# Patient Record
Sex: Male | Born: 1980 | Hispanic: Yes | Marital: Single | Smoking: Never smoker
Health system: Southern US, Community
[De-identification: ages and names within clinical notes are randomized; demographics above are authoritative.]

## PROBLEM LIST (undated history)

## (undated) DIAGNOSIS — R0600 Dyspnea, unspecified: Secondary | ICD-10-CM

## (undated) DIAGNOSIS — K219 Gastro-esophageal reflux disease without esophagitis: Secondary | ICD-10-CM

## (undated) DIAGNOSIS — F419 Anxiety disorder, unspecified: Secondary | ICD-10-CM

## (undated) DIAGNOSIS — F32A Depression, unspecified: Secondary | ICD-10-CM

## (undated) DIAGNOSIS — E079 Disorder of thyroid, unspecified: Secondary | ICD-10-CM

## (undated) HISTORY — DX: Disorder of thyroid, unspecified: E07.9

---

## 2018-03-06 ENCOUNTER — Encounter (HOSPITAL_COMMUNITY): Payer: Self-pay | Admitting: Emergency Medicine

## 2018-03-06 ENCOUNTER — Emergency Department (HOSPITAL_COMMUNITY): Payer: Self-pay

## 2018-03-06 ENCOUNTER — Emergency Department (HOSPITAL_COMMUNITY)
Admission: EM | Admit: 2018-03-06 | Discharge: 2018-03-07 | Disposition: A | Discharge status: Home / Self Care | Payer: Self-pay | Attending: Emergency Medicine | Admitting: Emergency Medicine

## 2018-03-06 DIAGNOSIS — R109 Unspecified abdominal pain: Secondary | ICD-10-CM

## 2018-03-06 DIAGNOSIS — G8929 Other chronic pain: Secondary | ICD-10-CM | POA: Insufficient documentation

## 2018-03-06 DIAGNOSIS — R1032 Left lower quadrant pain: Secondary | ICD-10-CM | POA: Insufficient documentation

## 2018-03-06 LAB — URINALYSIS, ROUTINE W REFLEX MICROSCOPIC
Bilirubin Urine: NEGATIVE
GLUCOSE, UA: NEGATIVE mg/dL
Hgb urine dipstick: NEGATIVE
Ketones, ur: NEGATIVE mg/dL
Leukocytes, UA: NEGATIVE
Nitrite: NEGATIVE
Protein, ur: NEGATIVE mg/dL
Specific Gravity, Urine: 1.023 (ref 1.005–1.030)
pH: 7 (ref 5.0–8.0)

## 2018-03-06 LAB — CBC WITH DIFFERENTIAL/PLATELET
Abs Immature Granulocytes: 0.03 10*3/uL (ref 0.00–0.07)
Basophils Absolute: 0 10*3/uL (ref 0.0–0.1)
Basophils Relative: 0 %
Eosinophils Absolute: 0.2 10*3/uL (ref 0.0–0.5)
Eosinophils Relative: 2 %
HCT: 42.4 % (ref 39.0–52.0)
Hemoglobin: 14 g/dL (ref 13.0–17.0)
Immature Granulocytes: 0 %
LYMPHS ABS: 2.9 10*3/uL (ref 0.7–4.0)
Lymphocytes Relative: 30 %
MCH: 28.9 pg (ref 26.0–34.0)
MCHC: 33 g/dL (ref 30.0–36.0)
MCV: 87.6 fL (ref 80.0–100.0)
Monocytes Absolute: 0.8 10*3/uL (ref 0.1–1.0)
Monocytes Relative: 8 %
Neutro Abs: 5.8 10*3/uL (ref 1.7–7.7)
Neutrophils Relative %: 60 %
Platelets: 174 10*3/uL (ref 150–400)
RBC: 4.84 MIL/uL (ref 4.22–5.81)
RDW: 12 % (ref 11.5–15.5)
WBC: 9.7 10*3/uL (ref 4.0–10.5)
nRBC: 0 % (ref 0.0–0.2)

## 2018-03-06 LAB — COMPREHENSIVE METABOLIC PANEL
ALT: 35 U/L (ref 0–44)
AST: 28 U/L (ref 15–41)
Albumin: 4 g/dL (ref 3.5–5.0)
Alkaline Phosphatase: 62 U/L (ref 38–126)
Anion gap: 9 (ref 5–15)
BUN: 14 mg/dL (ref 6–20)
CO2: 25 mmol/L (ref 22–32)
CREATININE: 1.16 mg/dL (ref 0.61–1.24)
Calcium: 9 mg/dL (ref 8.9–10.3)
Chloride: 105 mmol/L (ref 98–111)
GFR calc Af Amer: >60 mL/min (ref >60)
Glucose, Bld: 108 mg/dL — ABNORMAL HIGH (ref 70–99)
Potassium: 3.7 mmol/L (ref 3.5–5.1)
Sodium: 139 mmol/L (ref 135–145)
Total Bilirubin: 0.9 mg/dL (ref 0.3–1.2)
Total Protein: 6.7 g/dL (ref 6.5–8.1)

## 2018-03-06 LAB — I-STAT TROPONIN, ED: Troponin i, poc: 0 ng/mL (ref 0.00–0.08)

## 2018-03-06 LAB — LIPASE, BLOOD: Lipase: 34 U/L (ref 11–51)

## 2018-03-06 NOTE — ED Provider Notes (Signed)
[facility]ENC[facility] Provider Note   CSN: [id number] Arrival date & time: 03/06/18  1844     History   Chief Complaint No chief complaint on file.   HPI Eduardo Moore is a 37 y.o. male.  The history is provided by the patient. No language interpreter was used.     37 year old male presenting to the ED for evaluation of abdominal discomfort.  Patient report for nearly a month he has had recurrent abdominal pain.  Pain sometimes involve his left lower quadrant and sometimes is epigastric region.  Pain described as a burning sensation, worsening after eating greasy food, with associated nausea and vomiting.  Vomitus is nonbloody nonbilious.  He endorsed occasional loose stools as well.  He reports sometimes the pain is intense, having trouble taking a deep breath and having trouble burping.  He does not complain of any significant pain at this time.  He has never had this evaluation before but wife mention last year he was told that he may have heartburn and he has been taking Prilosec with some improvement.  He denies any significant alcohol use, no significant history of cardiac disease, he is not a smoker and no strong family history of cardiac disease.  Last episode of pain was earlier today.  He still has an intact gallbladder.  History reviewed. No pertinent past medical history.  There are no active problems to display for this patient.   History reviewed. No pertinent surgical history.      Home Medications    Prior to Admission medications   Not on File    Family History No family history on file.  Social History Social History   Tobacco Use  . Smoking status: Never Smoker  . Smokeless tobacco: Never Used  Substance Use Topics  . Alcohol use: Yes    Comment: occasional  . Drug use: Never     Allergies   Patient has no allergy information on record.   Review of Systems Review of Systems  All other systems  reviewed and are negative.    Physical Exam Updated Vital Signs BP 133/85 (BP Location: Right Arm)   Pulse 88   Temp (!) 97.5 F (36.4 C) (Oral)   Resp 14   Ht 5\' 10"  (1.778 m)   Wt 90.7 kg   SpO2 99%   BMI 28.70 kg/m   Physical Exam Vitals signs and nursing note reviewed.  Constitutional:      General: He is not in acute distress.    Appearance: He is well-developed.  HENT:     Head: Atraumatic.  Eyes:     Conjunctiva/sclera: Conjunctivae normal.  Neck:     Musculoskeletal: Neck supple.  Cardiovascular:     Rate and Rhythm: Normal rate and regular rhythm.     Pulses: Normal pulses.     Heart sounds: Normal heart sounds.  Pulmonary:     Effort: Pulmonary effort is normal.     Breath sounds: Normal breath sounds. No wheezing or rales.  Abdominal:     Palpations: Abdomen is soft.     Tenderness: There is abdominal tenderness (Mild tenderness left lower quadrant.  No significant tenderness to epigastric region, no tenderness to left upper quadrant, negative Murphy sign, no pain at McBurney's point.).  Skin:    Findings: No rash.  Neurological:     Mental Status: He is alert.      ED Treatments / Results  Labs (all labs ordered are listed, but  only abnormal results are displayed) Labs Reviewed  COMPREHENSIVE METABOLIC PANEL - Abnormal; Notable for the following components:      Result Value   Glucose, Bld 108 (*)    All other components within normal limits  CBC WITH DIFFERENTIAL/PLATELET  LIPASE, BLOOD  URINALYSIS, ROUTINE W REFLEX MICROSCOPIC  I-STAT TROPONIN, ED    EKG None   Date: 03/06/2018  Rate: 84  Rhythm: normal sinus rhythm  QRS Axis: normal  Intervals: normal  ST/T Wave abnormalities: normal  Conduction Disutrbances: none  Narrative Interpretation:   Old EKG Reviewed: No significant changes noted     Radiology [place] Abdomen Limited  Result Date: 03/06/2018 CLINICAL DATA:  Postprandial pain EXAM: ULTRASOUND ABDOMEN LIMITED RIGHT UPPER  QUADRANT COMPARISON:  None. FINDINGS: Gallbladder: The gallbladder is contracted and free of stones. No secondary signs of acute cholecystitis. Common bile duct: Diameter: 3 mm Liver: No focal lesion identified. Within normal limits in parenchymal echogenicity. Portal vein is patent on color Doppler imaging with normal direction of blood flow towards the liver. IMPRESSION: No sonographic findings to explain the patient's postprandial pain. Electronically Signed   By: Tollie Ethavid  Kwon M.D.   On: 03/06/2018 21:08    Procedures Procedures (including critical care time)  Medications Ordered in ED Medications - No data to display   Initial Impression / Assessment and Plan / ED Course  I have reviewed the triage vital signs and the nursing notes.  Pertinent labs & imaging results that were available during my care of the patient were reviewed by me and considered in my medical decision making (see chart for details).     BP 133/85 (BP Location: Right Arm)   Pulse 88   Temp (!) 97.5 F (36.4 C) (Oral)   Resp 14   Ht 5\' 10"  (1.778 m)   Wt 90.7 kg   SpO2 99%   BMI 28.70 kg/m    Final Clinical Impressions(s) / ED Diagnoses   Final diagnoses:  Recurrent abdominal pain    ED Discharge Orders    None     8:13 PM Patient report recurrent abdominal pain, postprandial pain with burning sensation.  This is likely GERD possibly biliary disease.  Will obtain limited abdominal ultrasound, check labs, pain is minimal at this time.  He was voicing concern for potential cardiac disease however he does not have any significant cardiac history.  Low suspicion for PE as well.  He is PERC negative.  9:41 PM EKG and troponin without concerning changes, normal WBC, normal H&H, normal electrolytes, normal lipase, limited abdominal ultrasound without gallbladder etiology.  Patient is afebrile, vital signs stable.  He does not have any significant discomfort at this time.  No acute emergent medical condition  identified.  Encourage patient to follow-up with PCP for further evaluation of his condition.  Return precautions discussed.  Encourage patient to continue taking Prilosec as previously recommended.  This can be obtained over-the-counter.   Fayrene Helperran, Demarie Uhlig, PA-C 03/06/18 2147    Raeford RazorKohut, Stephen, MD 03/07/18 [phone]

## 2018-03-06 NOTE — Discharge Instructions (Signed)
Avoid greasy food.  Take prilosec as needed for discomfort.  Follow up with your doctor for further care.  Return if your condition worsen or if you have any concerns.

## 2018-03-06 NOTE — ED Notes (Signed)
Pt to ultrasound at this time.

## 2018-03-06 NOTE — ED Notes (Signed)
Pt gone for scan. Will get EKG when he gets back.

## 2018-03-06 NOTE — ED Triage Notes (Signed)
Pt complains of SOB and N/V/D for 3 weeks. Denies fever. Pt does not have asthma.

## 2019-08-06 ENCOUNTER — Encounter: Payer: Self-pay | Admitting: Internal Medicine

## 2019-08-06 ENCOUNTER — Ambulatory Visit (INDEPENDENT_AMBULATORY_CARE_PROVIDER_SITE_OTHER): Payer: Self-pay | Admitting: Internal Medicine

## 2019-08-06 ENCOUNTER — Other Ambulatory Visit: Payer: Self-pay

## 2019-08-06 ENCOUNTER — Institutional Professional Consult (permissible substitution): Payer: Self-pay | Admitting: Internal Medicine

## 2019-08-06 ENCOUNTER — Other Ambulatory Visit (INDEPENDENT_AMBULATORY_CARE_PROVIDER_SITE_OTHER): Payer: Self-pay

## 2019-08-06 ENCOUNTER — Telehealth: Payer: Self-pay | Admitting: Internal Medicine

## 2019-08-06 VITALS — BP 126/84 | HR 93 | Temp 97.9°F | Ht 71.0 in | Wt 193.4 lb

## 2019-08-06 DIAGNOSIS — R062 Wheezing: Secondary | ICD-10-CM

## 2019-08-06 DIAGNOSIS — Z889 Allergy status to unspecified drugs, medicaments and biological substances status: Secondary | ICD-10-CM

## 2019-08-06 DIAGNOSIS — R0600 Dyspnea, unspecified: Secondary | ICD-10-CM

## 2019-08-06 DIAGNOSIS — R918 Other nonspecific abnormal finding of lung field: Secondary | ICD-10-CM

## 2019-08-06 DIAGNOSIS — R0609 Other forms of dyspnea: Secondary | ICD-10-CM

## 2019-08-06 LAB — CBC WITH DIFFERENTIAL/PLATELET
Basophils Absolute: 0 10*3/uL (ref 0.0–0.1)
Basophils Relative: 0.4 % (ref 0.0–3.0)
Eosinophils Absolute: 0.3 10*3/uL (ref 0.0–0.7)
Eosinophils Relative: 3.6 % (ref 0.0–5.0)
HCT: 42.6 % (ref 39.0–52.0)
Hemoglobin: 14.3 g/dL (ref 13.0–17.0)
Lymphocytes Relative: 30.2 % (ref 12.0–46.0)
Lymphs Abs: 2.3 10*3/uL (ref 0.7–4.0)
MCHC: 33.7 g/dL (ref 30.0–36.0)
MCV: 88.9 fl (ref 78.0–100.0)
Monocytes Absolute: 0.5 10*3/uL (ref 0.1–1.0)
Monocytes Relative: 6.9 % (ref 3.0–12.0)
Neutro Abs: 4.4 10*3/uL (ref 1.4–7.7)
Neutrophils Relative %: 58.9 % (ref 43.0–77.0)
Platelets: 166 10*3/uL (ref 150.0–400.0)
RBC: 4.79 Mil/uL (ref 4.22–5.81)
RDW: 12.9 % (ref 11.5–15.5)
WBC: 7.5 10*3/uL (ref 4.0–10.5)

## 2019-08-06 NOTE — Telephone Encounter (Signed)
rviewed CT chest -> has LUL mass. Does not look like cancer but needs PET scan for sure - he is out of work because of this and there is home buying/mortgage issue -> please see if you can set up PET wtihin 1 week  Pls let me know date of PET

## 2019-08-06 NOTE — Progress Notes (Signed)
OV 08/06/2019  Subjective:  Patient ID: Eduardo Moore, male , DOB: 09-01-1980 , age 39 y.o. , MRN: [id number] , ADDRESS: [address] [place] [address]   08/06/2019 -   Chief Complaint  Patient presents with  . Consult    pt states when doing activities gets sob. pt states wheezing.pt states breathing is worse at bedtime   PCP Patient, No Pcp Per Hx by patient - English speaking  Slow but proficient  HPI Eduardo Moore 39 y.o. - known to me x 1 because his daughter and my daughter attended school together [place] immigrant x 15 years to [place]. Works as an Museum/gallery curator . Exposed to fiberglass at work. No masking at this job until covid-19 pandemic. Approximately 2 y ears ago started noticing wheezing during pollen/spring season. Then insidious onset x few month has dyspnea associated with wheezing. Thsi time started in spring 2021. Wheezing present even at night. Symptoms significant enough that he quit working 2 weeks ago. Also got prednisone with zpak for 5 days 5/14-5/19/2021. Since then better. Using albuterol as needed only. During this time had CXR at [facility] and there is report of LUL mass. Had followup CT chest 07/31/19 that I visualized and agree with formal report of a smooth LUL posterior segment subpleural round mass with peripheral area of "gas". Old chart reviewed   PMHX  - gerd  Family hx  - mom has asthma  ROS - per HPI  Results for Eduardo Moore (MRN [id number]) as of 08/06/2019 12:12  Ref. Range 03/06/2018 20:03  WBC Latest Ref Range: 4.0 - 10.5 K/uL 9.7  RBC Latest Ref Range: 4.22 - 5.81 MIL/uL 4.84  Hemoglobin Latest Ref Range: 13.0 - 17.0 g/dL 31.5  HCT Latest Ref Range: 39.0 - 52.0 % 42.4  MCV Latest Ref Range: 80.0 - 100.0 fL 87.6  MCH Latest Ref Range: 26.0 - 34.0 pg 28.9  MCHC Latest Ref Range: 30.0 - 36.0 g/dL 17.6  RDW Latest Ref Range: 11.5 - 15.5 % 12.0  Platelets Latest Ref Range: 150 -  400 K/uL 174  nRBC Latest Ref Range: 0.0 - 0.2 % 0.0  Neutrophils Latest Units: % 60  Lymphocytes Latest Units: % 30  Monocytes Relative Latest Units: % 8  Eosinophil Latest Units: % 2  Basophil Latest Units: % 0  Immature Granulocytes Latest Units: % 0  NEUT# Latest Ref Range: 1.7 - 7.7 K/uL 5.8  Lymphocyte # Latest Ref Range: 0.7 - 4.0 K/uL 2.9  Monocyte # Latest Ref Range: 0.1 - 1.0 K/uL 0.8  Eosinophils Absolute Latest Ref Range: 0.0 - 0.5 K/uL 0.2     has no past medical history on file.   reports that he has never smoked. He has never used smokeless tobacco.  No past surgical history on file.  Allergies  Allergen Reactions  . Other Shortness Of Breath and Rash    Reaction to green peas and lentils    Immunization History  Administered Date(s) Administered  . PFIZER SARS-COV-2 Vaccination 07/08/2019, 07/29/2019    No family history on file.  No current outpatient medications on file.      Objective:   Vitals:   08/06/19 1148  BP: 126/84  Pulse: 93  Temp: 97.9 F (36.6 C)  TempSrc: Oral  SpO2: 99%  Weight: 193 lb 6.4 oz (87.7 kg)  Height: 5\' 11"  (1.803 m)    Estimated body mass index is 26.97 kg/m as calculated from  the following:   Height as of this encounter: 5\' 11"  (1.803 m).   Weight as of this encounter: 193 lb 6.4 oz (87.7 kg).  @WEIGHTCHANGE @    08/06/19 1148  Weight: 193 lb 6.4 oz (87.7 kg)     Physical Exam  General Appearance:    Alert, cooperative, no distress, appears stated age - yes Deconditioned looking - no , OBESE  -no, Sitting on Wheelchair -  no  Head:    Normocephalic, without obvious abnormality, atraumatic  Eyes:    PERRL, conjunctiva/corneas clear,  Ears:    Normal TM's and external ear canals, both ears  Nose:   Nares normal, septum midline, mucosa normal, no drainage    or sinus tenderness. OXYGEN ON  - no . Patient is @ ra   Throat:   Lips, mucosa, and tongue normal; teeth and gums normal. Cyanosis on  lips - no  Neck:   Supple, symmetrical, trachea midline, no adenopathy;    thyroid:  no enlargement/tenderness/nodules; no carotid   bruit or JVD  Back:     Symmetric, no curvature, ROM normal, no CVA tenderness  Lungs:     Distress - no , Wheeze no, Barrell Chest -no, Purse lip breathing - no, Crackles - no   Chest Wall:    No tenderness or deformity.    Heart:    Regular rate and rhythm, S1 and S2 normal, no rub   or gallop, Murmur - no  Breast Exam:    NOT DONE  Abdomen:     Soft, non-tender, bowel sounds active all four quadrants,    no masses, no organomegaly. Visceral obesity - no  Genitalia:   NOT DONE  Rectal:   NOT DONE  Extremities:   Extremities - normal, Has Cane -no, Clubbing - no, Edema - np  Pulses:   2+ and symmetric all extremities  Skin:   Stigmata of Connective Tissue Disease - no  Lymph nodes:   Cervical, supraclavicular, and axillary nodes normal  Psychiatric:  Neurologic:   Pleasant - yes, Anxious - no, Flat affect - no  CAm-ICU - neg, Alert and Oriented x 3 - yes, Moves all 4s - yes, Speech - normal, Cognition - intact           Assessment:       ICD-10-CM   1. Mass of upper lobe of left lung  R91.8 CBC w/Diff    Pulmonary function test    Nitric oxide    QuantiFERON-TB Gold Plus  2. Dyspnea on exertion  R06.00 CBC w/Diff    Pulmonary function test    Nitric oxide    QuantiFERON-TB Gold Plus  3. Wheezing  R06.2 CBC w/Diff    Pulmonary function test    Nitric oxide    QuantiFERON-TB Gold Plus  4. History of seasonal allergies  Z88.9 CBC w/Diff    Pulmonary function test    Nitric oxide    QuantiFERON-TB Gold Plus   After he left reviwed LUL mass on CT and visualized - looks smooth - might be carcinoid. If so might correlate with symptoms    Plan:     Patient Instructions  Mass of upper lobe of left lung  This might be an unrelated problem to your symptoms.  Plan - do quantiferon gold blood test -I will need to look at your scan myself  and call you back with an updated plan.  -This plan might involve getting additional scans and also undergoing procedure  called bronchoscopy  Dyspnea on exertion Wheezing History of seasonal allergies  The symptoms sound really suspicious for asthma especially given family history of asthma  Plan -Check CBC with differential, blood IgE - do this 08/06/2019 -Check exhaled nitric oxide testing as soon as possible -Check Full pulmonary function test pre and postbronchodilator -For now continue albuterol as needed  Follow-up - <2 weeks with nurse practitioner or myself -for a face-to-face visit but after completing test  -Any evidence of asthma will have to start inhaled steroid     SIGNATURE    Dr. Brand Males, M.D., F.C.C.P,  Pulmonary and Critical Care Medicine Staff Physician, [place] Director - Interstitial Lung Disease  Program  Pulmonary [place] [facility] [place] [address], [place], [address]  Pager: [phone], If no answer or between  15:00h - 7:00h: call [phone] Telephone: [phone]  4:38 PM 08/06/2019

## 2019-08-06 NOTE — Patient Instructions (Signed)
Mass of upper lobe of left lung  This might be an unrelated problem to your symptoms.  Plan - do quantiferon gold blood test -I will need to look at your scan myself and call you back with an updated plan.  -This plan might involve getting additional scans and also undergoing procedure called bronchoscopy  Dyspnea on exertion Wheezing History of seasonal allergies  The symptoms sound really suspicious for asthma especially given family history of asthma  Plan -Check CBC with differential, blood IgE - do this 08/06/2019 -Check exhaled nitric oxide testing as soon as possible -Check Full pulmonary function test pre and postbronchodilator -For now continue albuterol as needed  Follow-up - <2 weeks with nurse practitioner or myself -for a face-to-face visit but after completing test  -Any evidence of asthma will have to start inhaled steroid

## 2019-08-08 LAB — QUANTIFERON-TB GOLD PLUS
Mitogen-NIL: >10 IU/mL
NIL: 0.03 IU/mL
QuantiFERON-TB Gold Plus: NEGATIVE
TB1-NIL: 0 IU/mL
TB2-NIL: 0.01 IU/mL

## 2019-08-08 NOTE — Telephone Encounter (Signed)
Called and spoke with pt letting him know the info stated by MR and that we were going to order PET to further evaluate CT. Pt verbalized understanding. Order for PET has been placed. Nothing further needed.

## 2019-08-21 ENCOUNTER — Ambulatory Visit (HOSPITAL_COMMUNITY)
Admission: RE | Admit: 2019-08-21 | Discharge: 2019-08-21 | Disposition: A | Discharge status: Home / Self Care | Payer: Self-pay | Source: Ambulatory Visit | Attending: Internal Medicine | Admitting: Internal Medicine

## 2019-08-21 ENCOUNTER — Other Ambulatory Visit: Payer: Self-pay

## 2019-08-21 DIAGNOSIS — R918 Other nonspecific abnormal finding of lung field: Secondary | ICD-10-CM | POA: Insufficient documentation

## 2019-08-21 LAB — GLUCOSE, CAPILLARY: Glucose-Capillary: 96 mg/dL (ref 70–99)

## 2019-08-21 MED ORDER — FLUDEOXYGLUCOSE F - 18 (FDG) INJECTION
9.6300 | Freq: Once | INTRAVENOUS | Status: AC | PRN
  Administered 2019-08-21: 9.63 via INTRAVENOUS

## 2019-08-22 ENCOUNTER — Ambulatory Visit (INDEPENDENT_AMBULATORY_CARE_PROVIDER_SITE_OTHER): Payer: Self-pay | Admitting: Adult Health

## 2019-08-22 ENCOUNTER — Encounter: Payer: Self-pay | Admitting: Adult Health

## 2019-08-22 DIAGNOSIS — R918 Other nonspecific abnormal finding of lung field: Secondary | ICD-10-CM

## 2019-08-22 MED ORDER — AMOXICILLIN-POT CLAVULANATE 875-125 MG PO TABS: 1.0000 | ORAL_TABLET | Freq: Two times a day (BID) | ORAL | 0 refills | Status: AC

## 2019-08-22 NOTE — Patient Instructions (Addendum)
Begin Augmentin 875mg . Twice daily  For 10 days - take with food. Eat yogurt daily -this is good to eat while on antibiotics  Mucinex DM Twice daily  As needed  Cough/congestion (can buy store brand which should be less)  Can look at Good Rx to help with cost of medication Follow up with Dr. in 2 weeks with chest xray and As needed   Please contact office for sooner follow up if symptoms do not improve or worsen or seek emergency care

## 2019-08-22 NOTE — Progress Notes (Signed)
@Patient  ID: , male    DOB: 1980-12-06, 39 y.o.   MRN: [id number]    Referring provider: No ref. 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 , bronchisi as child    TEST/EVENTS :  QuantiFERON gold Aug 06, 2019 -  08/22/2019 Follow up : Lung mass and Dyspnea (exam is done with a Spanish interpreter) Patient returns for a 2-week follow-up.  Patient was seen for a pulmonary consult Aug 06, 2019 for lung mass and shortness of breath.  Patient started to notice some breathing problems around 2 years ago with wheezing and shortness of breath.  Approximately 6 weeks ago started having increased wheezing and cough.  Cough is predominantly nonproductive.  However he did have a couple episodes where he saw some trace blood.  Patient was treated with a Z-Pak and prednisone May 14 through May 19.  Did have some improvement.  Chest x-ray showed a left upper lobe lung mass.  Patient had a CT chest that showed a smooth left upper lobe posterior segment subpleural groundglass. Patient has not worked in the last few weeks because he was worried that he would continue to get exposed to things that might make his lungs worse. Patient denies any weight loss.  Has a good appetite.  No nausea vomiting or diarrhea.  Patient says he does have some back pain on occasion especially if he takes in a deep breath. Patient was set up for a PET scan on August 21, 2019 that showed a superior segment left lower lobe rounded nodule measuring 2.6 x 2.7 cm.  Nodule has mild peripheral metabolic activity.  SUV max 3.3.  Centrally the lesion is near fluid density.  Left hilar lymph node measuring 9 mm has minimal metabolic activity SUV max equal 2.3. No central hypermetabolic mediastinal lymph nodes.  No  additional pulmonary nodules. PET scan interpretation relatively low metabolic activity for size and central fluid density favor a benign infectious process.  Cannot rule out possible carcinoma.  Recommendations for a repeat CT chest in 1 to 3 months. CBC showed normal white blood cell count and H&H.  Eosinophils 300.  Social history: Never smoker.  Denies alcohol or drugs.  Patient is from [place], 2019.  Came to the [place] around 2005.  He speaks predominantly 2006.  Does speak and understand some [place].  Patient says he does get dental checkups.  Had dental checkup 6 months ago with no known dental caries.  No known exposure or history of tuberculosis. Works at [place] exposed to [facility]. Patient does not have insurance.   Allergies  Allergen Reactions  . Other Shortness Of Breath and Rash    Reaction to green peas and lentils    Immunization History  Administered Date(s) Administered  . PFIZER SARS-COV-2 Vaccination 07/08/2019, 07/29/2019    History reviewed. No pertinent past medical history.  Tobacco History: Social History   Tobacco Use  Smoking Status Never Smoker  Smokeless Tobacco Never Used   Counseling given: Not Answered   No outpatient medications prior to visit.   No facility-administered medications prior to visit.     Review of Systems:   Constitutional:   No  weight loss, night sweats,  Fevers, chills, fatigue, or  lassitude.  HEENT:  No headaches,  Difficulty swallowing,  Tooth/dental problems, or  Sore throat,                No sneezing, itching, ear ache, nasal congestion, post nasal drip,   CV:  No chest pain,  Orthopnea, PND, swelling in lower extremities, anasarca, dizziness, palpitations, syncope.   GI  No heartburn, indigestion, abdominal pain, nausea, vomiting, diarrhea, change in bowel habits, loss of appetite, bloody stools.   Resp:    No chest wall deformity  Skin: no rash or lesions.  GU: no dysuria,  change in color of urine, no urgency or frequency.  No flank pain, no hematuria   MS:  No joint pain or swelling.  No decreased range of motion.  No back pain.    Physical Exam  BP 132/82 (BP Location: Left Arm, Cuff Size: Normal)   Pulse 81   Temp (!) 97.3 F (36.3 C) (Oral)   Ht 5\' 9"  (1.753 m)   Wt 195 lb 9.6 oz (88.7 kg)   SpO2 95%   BMI 28.89 kg/m   GEN: A/Ox3; pleasant , NAD, well nourished    HEENT:  [place]/AT,    NOSE-clear, THROAT-clear, no lesions, no postnasal drip or exudate noted. Dentition-no apparent dental caries noted.  NECK:  Supple w/ fair ROM; no JVD; normal carotid impulses w/o bruits; no thyromegaly or nodules palpated; no lymphadenopathy.    RESP  Clear  P & A; w/o, wheezes/ rales/ or rhonchi. no accessory muscle use, no dullness to percussion  CARD:  RRR, no m/r/g, no peripheral edema, pulses intact, no cyanosis or clubbing.  GI:   Soft & nt; nml bowel sounds; no organomegaly or masses detected.   Musco: Warm bil, no deformities or joint swelling noted.   Neuro: alert, no focal deficits noted.    Skin: Warm, no lesions or rashes    Lab Results:   BNP No results found for: BNP  ProBNP No results found for: PROBNP  Imaging: NM PET Image Initial (PI) Skull Base To Thigh  Result Date: 08/21/2019 CLINICAL DATA:  Initial treatment strategy for lung mass. EXAM: NUCLEAR MEDICINE PET SKULL BASE TO THIGH TECHNIQUE: 9.6 mCi F-18 FDG was injected intravenously. Full-ring PET imaging was performed from the skull base to thigh after the radiotracer. CT data was obtained and used for attenuation correction and anatomic localization. Fasting blood glucose: 96 mg/dl COMPARISON:  None. FINDINGS: Mediastinal blood pool activity: SUV max 2.7 Liver activity: SUV max NA NECK: No hypermetabolic lymph nodes in the neck. Incidental CT findings: none CHEST: The superior segment of the LEFT lower lobe rounded nodule measuring 2.6 by 2.7 cm. Nodule has mild peripheral  metabolic activity SUV max equal 3.3. Centrally the lesion is near fluid density. No comparison exam. Prominent LEFT hilar lymph node measuring 9 mm has minimal metabolic activity SUV max equal 2.3. No central hypermetabolic mediastinal lymph nodes. No additional pulmonary nodules. Incidental CT findings: none ABDOMEN/PELVIS: No abnormal hypermetabolic activity within the liver, pancreas, adrenal glands, or spleen. No hypermetabolic lymph nodes in the abdomen or pelvis. Incidental CT findings: none SKELETON: No focal hypermetabolic activity to suggest skeletal metastasis. Incidental CT findings: none IMPRESSION: 1. Low peripheral metabolic activity associated with the LEFT lower lobe rounded pulmonary nodule. The relatively low metabolic activity for size and central fluid density favor benign infectious process. Cannot exclude a low metabolically active lesion such is mucinous carcinoma. Recommend follow-up CT of the thorax with contrast in 1-3 months to demonstrate reduction in  volume. 2. Prominent LEFT hilar lymph node is likely reactive. 3. No distant metastatic disease. 4. No evidence of primary malignancy elsewhere on the whole-body scan. Electronically Signed   By: Suzy Bouchard M.D.   On: 08/21/2019 16:34      No flowsheet data found.  No results found for: NITRICOXIDE      Assessment & Plan:   No problem-specific Assessment & Plan notes found for this encounter.     Rexene Edison, NP 08/22/2019

## 2019-08-25 DIAGNOSIS — R918 Other nonspecific abnormal finding of lung field: Secondary | ICD-10-CM | POA: Insufficient documentation

## 2019-08-25 NOTE — Assessment & Plan Note (Signed)
Left lower lobe lung mass with low hypermetabolic activity on PET scan.  Area on PET scan shows a central fluid density possibly representing underlying infectious process.  QuantiFERON gold was negative.  Patient is a never smoker.  We will treat for possible underlying pneumonia.  Have very close follow-up in 2 weeks with a follow-up chest x-ray .  Pending these results could consider a follow-up CT in 6 weeks.  If no significant change then could consider if need to do tissue sampling   Plan  Patient Instructions  Begin Augmentin 875mg . Twice daily  For 10 days - take with food. Eat yogurt daily -this is good to eat while on antibiotics  Mucinex DM Twice daily  As needed  Cough/congestion (can buy store brand which should be less)  Can look at Good Rx to help with cost of medication Follow up with Dr. in 2 weeks with chest xray and As needed   Please contact office for sooner follow up if symptoms do not improve or worsen or seek emergency care

## 2019-08-26 ENCOUNTER — Telehealth: Payer: Self-pay | Admitting: Internal Medicine

## 2019-08-26 DIAGNOSIS — R0609 Other forms of dyspnea: Secondary | ICD-10-CM

## 2019-08-26 NOTE — Telephone Encounter (Signed)
Let Eduardo Moore know that the PET scan did not show any increaed uptake in area of the mass. I will discuss with him results during office visit 09/11/19. And blood test for Tb ios fine too  But the main thing is a) if the mdi I gave him is helping him? And b) is he back to work?   LMK his answers  Thnanks    SIGNATURE    Dr. Kalman Shan, M.D., F.C.C.P,  Pulmonary and Critical Care Medicine Staff Physician, [facility] Center Director - Interstitial Lung Disease  Program  Pulmonary Fibrosis [facility] at [facility] [place], [place], [address]  Pager: [phone], If no answer or between  15:00h - 7:00h: call [phone] Telephone: [phone]  3:32 PM 08/26/2019    Results for Eduardo, Moore (MRN [id number]) as of 08/26/2019 15:31  Ref. Range 08/06/2019 13:13  Eosinophils Absolute Latest Ref Range: 0 - 0 K/uL 0.3

## 2019-08-27 NOTE — Addendum Note (Signed)
Addended by: Wyvonne Lenz on: 08/27/2019 10:38 AM   Modules accepted: Orders

## 2019-08-27 NOTE — Telephone Encounter (Signed)
Order for IGE was not placed at pt's last visit and pt was not scheduled to have PFT with feno at last OV.  Pt has had his vaccine so he does not need to be covid tested.  Attempted to call pt to get him scheduled for PFT but unable to reach.  Left message for pt to return call.  Routing this to [employer] so they can help follow up on. Pt needs to be scheduled for Full PFT (1hr) prior to upcoming OV with MR.   Pt has received his covid vaccine so he DOES NOT need to be covid tested.

## 2019-08-27 NOTE — Telephone Encounter (Signed)
Called and spoke with pt letting him know the info stated by MR and he verbalized understanding.  Asked pt if inhaler that he was started on has helped and he stated that the inhaler has helped. Pt states that he has not returned to work yet as he wants to wait until upcoming OV 7/1 to further discuss with MR.

## 2019-08-27 NOTE — Telephone Encounter (Signed)
At the last visit I wanted a blood IgE but I see this has not been done At the last visit I also wanted for pulmonary function test and exhaled nitric oxide testing -but I do not see this on the schedule  Is it possible for this to be completed before his visit on September 11, 2019?

## 2019-08-29 NOTE — Telephone Encounter (Signed)
MR does want the PFT with Feno to be done prior to pt seeing MR. Please reschedule pt's appt with MR so that way it can accommodate a PFT.

## 2019-08-29 NOTE — Telephone Encounter (Signed)
Only available pft prior to patient's visit with Dr. Lelon Mast is 09/01/2019 called patient and he is unable to come that day. Do we need to r/s the appt with MR to accommodate a pft? -pr

## 2019-09-02 NOTE — Telephone Encounter (Signed)
Left message on pt vm to call back to schedule PFT - no available dates in July. Canceled appt with MR due to no availability to schedule PFT prior. -pr

## 2019-09-04 ENCOUNTER — Ambulatory Visit (INDEPENDENT_AMBULATORY_CARE_PROVIDER_SITE_OTHER): Payer: Self-pay | Admitting: Internal Medicine

## 2019-09-04 ENCOUNTER — Other Ambulatory Visit: Payer: Self-pay

## 2019-09-04 DIAGNOSIS — R0609 Other forms of dyspnea: Secondary | ICD-10-CM

## 2019-09-04 DIAGNOSIS — R062 Wheezing: Secondary | ICD-10-CM

## 2019-09-04 DIAGNOSIS — Z889 Allergy status to unspecified drugs, medicaments and biological substances status: Secondary | ICD-10-CM

## 2019-09-04 DIAGNOSIS — R918 Other nonspecific abnormal finding of lung field: Secondary | ICD-10-CM

## 2019-09-04 LAB — PULMONARY FUNCTION TEST
DL/VA % pred: 124 %
DL/VA: 5.81 ml/min/mmHg/L
DLCO cor % pred: 117 %
DLCO cor: 34.7 ml/min/mmHg
DLCO unc % pred: 116 %
DLCO unc: 34.4 ml/min/mmHg
FEF 25-75 Post: 3.88 L/sec
FEF 25-75 Pre: 3.14 L/sec
FEF2575-%Change-Post: 23 %
FEF2575-%Pred-Post: 101 %
FEF2575-%Pred-Pre: 82 %
FEV1-%Change-Post: 6 %
FEV1-%Pred-Post: 95 %
FEV1-%Pred-Pre: 89 %
FEV1-Post: 3.84 L
FEV1-Pre: 3.6 L
FEV1FVC-%Change-Post: 6 %
FEV1FVC-%Pred-Pre: 95 %
FEV6-%Change-Post: 0 %
FEV6-%Pred-Post: 95 %
FEV6-%Pred-Pre: 94 %
FEV6-Post: 4.68 L
FEV6-Pre: 4.67 L
FEV6FVC-%Change-Post: 0 %
FEV6FVC-%Pred-Post: 102 %
FEV6FVC-%Pred-Pre: 101 %
FVC-%Change-Post: 0 %
FVC-%Pred-Post: 93 %
FVC-%Pred-Pre: 93 %
FVC-Post: 4.72 L
FVC-Pre: 4.7 L
Post FEV1/FVC ratio: 81 %
Post FEV6/FVC ratio: 100 %
Pre FEV1/FVC ratio: 77 %
Pre FEV6/FVC Ratio: 99 %
RV % pred: 81 %
RV: 1.43 L
TLC % pred: 92 %
TLC: 6.1 L

## 2019-09-04 NOTE — Progress Notes (Signed)
PFT done today. 

## 2019-09-07 ENCOUNTER — Telehealth: Payer: Self-pay | Admitting: Internal Medicine

## 2019-09-07 NOTE — Telephone Encounter (Signed)
Eduardo Moore  Was FeNO done for this patient Eduardo Moore ? Please advise ASAP  Thanks  MR

## 2019-09-08 NOTE — Telephone Encounter (Signed)
When pt had PFT, feno was not done as it was not stated with f/u instructions to have feno prior to PFT. IgE labwork also was not done.  MR, Please see phone encounter from 6/15.

## 2019-09-11 ENCOUNTER — Ambulatory Visit: Payer: Self-pay | Admitting: Internal Medicine

## 2019-09-12 ENCOUNTER — Telehealth: Payer: Self-pay | Admitting: Internal Medicine

## 2019-09-12 ENCOUNTER — Other Ambulatory Visit: Payer: Self-pay

## 2019-09-12 ENCOUNTER — Other Ambulatory Visit
Admission: RE | Admit: 2019-09-12 | Discharge: 2019-09-12 | Disposition: A | Discharge status: Home / Self Care | Payer: Self-pay | Attending: Internal Medicine | Admitting: Internal Medicine

## 2019-09-12 ENCOUNTER — Encounter: Payer: Self-pay | Admitting: Internal Medicine

## 2019-09-12 ENCOUNTER — Ambulatory Visit (INDEPENDENT_AMBULATORY_CARE_PROVIDER_SITE_OTHER): Payer: Self-pay | Admitting: Internal Medicine

## 2019-09-12 VITALS — BP 132/78 | HR 79 | Temp 97.9°F | Ht 69.0 in | Wt 193.0 lb

## 2019-09-12 DIAGNOSIS — R0609 Other forms of dyspnea: Secondary | ICD-10-CM

## 2019-09-12 DIAGNOSIS — Z598 Other problems related to housing and economic circumstances: Secondary | ICD-10-CM

## 2019-09-12 DIAGNOSIS — R0600 Dyspnea, unspecified: Secondary | ICD-10-CM | POA: Insufficient documentation

## 2019-09-12 DIAGNOSIS — R042 Hemoptysis: Secondary | ICD-10-CM

## 2019-09-12 DIAGNOSIS — Z599 Problem related to housing and economic circumstances, unspecified: Secondary | ICD-10-CM

## 2019-09-12 DIAGNOSIS — Z572 Occupational exposure to dust: Secondary | ICD-10-CM

## 2019-09-12 DIAGNOSIS — R062 Wheezing: Secondary | ICD-10-CM

## 2019-09-12 DIAGNOSIS — R918 Other nonspecific abnormal finding of lung field: Secondary | ICD-10-CM

## 2019-09-12 DIAGNOSIS — Z889 Allergy status to unspecified drugs, medicaments and biological substances status: Secondary | ICD-10-CM

## 2019-09-12 MED ORDER — BREO ELLIPTA 200-25 MCG/INH IN AEPB: 1.0000 | INHALATION_SPRAY | Freq: Every day | RESPIRATORY_TRACT | 0 refills | Status: AC

## 2019-09-12 NOTE — Telephone Encounter (Signed)
Pt is aware that he will need to contact [place] [facility] and request a disc.  Pt has been provided with [facility]'s contact number. Nothing further is needed.

## 2019-09-12 NOTE — Patient Instructions (Addendum)
Mass of upper lobe of left lung Hemoptysis  - still smptomatic and I think the mass is still there  Plan  - refer Dr [name] of [facility] in [place] for evaluaton for excision (he might have you do another CT scan)  Dyspnea on exertion Wheezing History of seasonal allergies Occupational exposure to dust - these symptoms  could all be due to mass as opposed to true asthma from allergies or fiberglass dust at work - glad albuterol has helped  Plan - check blood IgE -Hold off on blood allergy panel because of financial difficulties  - use albuterol as neeeded - take breoo (200 dose) sample and use it 1 puff daily regularly  - if symptioms getting worse call us  - use N95 (not KN95) or respirator at work with fiberglass (take note)  Financial Difficulties  Plan  - try to get health insurance; work with case Production designer, theatre/television/film or call local health department for medicaid  Followup - 4 weeks in [place] office - face to face with Nurse Practitioner to review  -Especially pay attention if long-acting beta agonist and carcinoid is making symptoms worse  - needs interpreter

## 2019-09-12 NOTE — Progress Notes (Signed)
OV 08/06/2019  Subjective:  Patient ID: Eduardo Moore, male , DOB: 1980-11-15 , age 39 y.o. , MRN: [id number] , ADDRESS: [place] [address]   08/06/2019 -   Chief Complaint  Patient presents with  . Consult    pt states when doing activities gets sob. pt states wheezing.pt states breathing is worse at bedtime   PCP Patient, No Pcp Per Hx by patient - English speaking  Slow but proficient  HPI Eduardo Moore 39 y.o. - known to me x 1 because his daughter and my daughter attended school together [place] immigrant x 15 years to [place]. 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 No masking at this job until covid-19 pandemic. Approximately 2 y ears ago started noticing wheezing during pollen/spring season. Then insidious onset x few month has dyspnea associated with wheezing. Thsi time started in spring 2021. Wheezing present even at night. Symptoms significant enough that he quit working 2 weeks ago. Also got prednisone with zpak for 5 days 5/14-5/19/2021. Since then better. Using albuterol as needed only. During this time had CXR at [facility] and there is report of LUL mass. Had followup CT chest 07/31/19 that I visualized and agree with formal report of a smooth LUL posterior segment subpleural round mass with peripheral area of "gas". Old chart reviewed   PMHX  - gerd  Family hx  - mom has asthma  ROS - per HPI  Results for Eduardo, Moore (MRN [id number]) as of 08/06/2019 12:12  Ref. Range 03/06/2018 20:03  WBC Latest Ref Range: 4.0 - 10.5 K/uL 9.7  RBC Latest Ref Range: 4.22 - 5.81 MIL/uL 4.84  Hemoglobin Latest Ref Range: 13.0 - 17.0 g/dL 14.0  HCT Latest Ref Range: 39.0 - 52.0 % 42.4  MCV Latest Ref Range: 80.0 - 100.0 fL 87.6  MCH Latest Ref Range: 26.0 - 34.0 pg 28.9  MCHC Latest Ref Range: 30.0 - 36.0 g/dL 33.0  RDW Latest Ref Range: 11.5 - 15.5 % 12.0  Platelets Latest Ref Range: 150 -  400 K/uL 174  nRBC Latest Ref Range: 0.0 - 0.2 % 0.0  Neutrophils Latest Units: % 60  Lymphocytes Latest Units: % 30  Monocytes Relative Latest Units: % 8  Eosinophil Latest Units: % 2  Basophil Latest Units: % 0  Immature Granulocytes Latest Units: % 0  NEUT# Latest Ref Range: 1.7 - 7.7 K/uL 5.8  Lymphocyte # Latest Ref Range: 0.7 - 4.0 K/uL 2.9  Monocyte # Latest Ref Range: 0.1 - 1.0 K/uL 0.8  Eosinophils Absolute Latest Ref Range: 0.0 - 0.5 K/uL 0.2      08/22/2019 Follow up : Lung mass and Dyspnea (exam is done with a Spanish interpreter)  39 year old male never smoker seen for pulmonary consult Aug 06, 2019 for lung mass, shortness of breath and wheezing 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  [place] , 14 yr . , bronchisi as child    TEST/EVENTS :  QuantiFERON gold Aug 06, 2019 -   Patient returns for a 2-week follow-up.  Patient was seen for a pulmonary consult Aug 06, 2019 for lung mass and shortness of breath.  Patient started to notice some breathing problems around 2 years ago with wheezing and shortness of breath.  Approximately 6 weeks ago started having increased wheezing and cough.  Cough is predominantly nonproductive.  However he did have a  couple episodes where he saw some trace blood.  Patient was treated with a Z-Pak and prednisone May 14 through May 19.  Did have some improvement.  Chest x-ray showed a left upper lobe lung mass.  Patient had a CT chest that showed a smooth left upper lobe posterior segment subpleural groundglass. Patient has not worked in the last few weeks because he was worried that he would continue to get exposed to things that might make his lungs worse.  Patient denies any weight loss.  Has a good appetite.  No nausea vomiting or diarrhea.  Patient says he does have some back pain on occasion especially if he takes in a deep breath.  Patient was set up for a PET scan on August 21, 2019 that  showed a superior segment left lower lobe rounded nodule measuring 2.6 x 2.7 cm.  Nodule has mild peripheral metabolic activity.  SUV max 3.3.  Centrally the lesion is near fluid density.  Left hilar lymph node measuring 9 mm has minimal metabolic activity SUV max equal 2.3. No central hypermetabolic mediastinal lymph nodes.  No additional pulmonary nodules. PET scan interpretation relatively low metabolic activity for size and central fluid density favor a benign infectious process.  Cannot rule out possible carcinoma.  Recommendations for a repeat CT chest in 1 to 3 months. CBC showed normal white blood cell count and H&H.  Eosinophils 300.  Social history: Never smoker.  Denies alcohol or drugs.  Patient is from [place].  Came to the [place] around 2005.  He speaks predominantly [place].  Does speak and understand some [place].  Patient says he does get dental checkups.  Had dental checkup 6 months ago with no known dental caries.  No known exposure or history of tuberculosis. Works at [employer] exposed to Medical sales representative. Patient does not have insurance.  OV 09/12/2019  Subjective:  Patient ID: Eduardo Moore, male , DOB: Jan 30, 1981 , age 47 y.o. , MRN: [id number] , ADDRESS: [place] [address]   09/12/2019 -   Chief Complaint  Patient presents with  . Follow-up    PET 08/21/2019. c/o wheezing and prod cough with tan mucus occ mixed with light pink blood.    Follow-up left upper lobe lung mass Follow-up asthma symptoms  -history of seasonal allergies and exposure to fiberglass dust at work Follow-up financial difficulties/lack of health insurance  HPI Eduardo Moore 39 y.o. -presents to the [place] office.  Interpreter is Curator.  This is a face-to-face visit.  Since I last saw him he see nurse practitioner.  He had pulmonary function test that is normal.  So far he has not had blood IgE or exhaled nitric oxide testing but blood  eosinophils are 300 cells per cubic millimeter and slightly elevated.  He continues to have asthma symptoms of shortness of breath some chest tightness when he takes a deep breath particularly in the back and occasional wheezing.  The symptoms are accentuated at work.  In addition he tells me for the first time that he is having spotty scattered hemoptysis mixed in his phlegm.  When he saw a nurse practitioner last antibiotics and prednisone did help him but the symptoms returned again.  He tells me that the hemoptysis actually been going on for a few months.  In fact it was there when he first met me [at that time there was no interpreter].  He did have a PET scan early June 2021 and this shows  persistence of the mass suspicious of carcinoid although low uptake.  He also wants to know if he should be wearing a mask at work and if so what type of mass.  He tells me the employer is not providing proper PPE.  He ultimately might change his job.    He has now returned to work.  He is trying to buy a house and is trying to make payments.  Therefore he needs to work as well.  He does not have health insurance and is working with the case Metallurgist on this.  He is beginning to get bills to pay out-of-pocket from the health system.     ROS - per HPI     has no past medical history on file.   reports that he has never smoked. He has never used smokeless tobacco.  No past surgical history on file.  Allergies  Allergen Reactions  . Other Shortness Of Breath and Rash    Reaction to green peas and lentils    Immunization History  Administered Date(s) Administered  . PFIZER SARS-COV-2 Vaccination 07/08/2019, 07/29/2019    Family History  Problem Relation Age of Onset  . Asthma Mother      Current Outpatient Medications:  .  fluticasone furoate-vilanterol (BREO ELLIPTA) 200-25 MCG/INH AEPB, Inhale 1 puff into the lungs daily for 1 day., Disp: 14 each, Rfl: 0      Objective:    Vitals:   09/12/19 0945  BP: 132/78  Pulse: 79  Temp: 97.9 F (36.6 C)  TempSrc: Temporal  SpO2: 96%  Weight: 193 lb (87.5 kg)  Height: 5' 9"  (1.753 m)    Estimated body mass index is 28.5 kg/m as calculated from the following:   Height as of this encounter: 5' 9"  (1.753 m).   Weight as of this encounter: 193 lb (87.5 kg).  @WEIGHTCHANGE @  [employer]   09/12/19 0945  Weight: 193 lb (87.5 kg)     Physical Exam Pleasant male with normal oral cavity.  No thrush.  No elevated neck nodes or JVP.  Clear to auscultation bilaterally.  When I did ask him to take a deep breath he felt his left back was tight.  Alert and oriented x3.  Abdomen soft normal heart sounds.  Overall nonfocal exam no stigmata of connective tissue disease. Assessment:       ICD-10-CM   1. Mass of upper lobe of left lung  R91.8 Ambulatory referral to Cardiothoracic Surgery  2. Hemoptysis  R04.2   3. Dyspnea on exertion  R06.00 IgE  4. Wheezing  R06.2 IgE  5. History of seasonal allergies  Z88.9 IgE  6. Occupational exposure to dust  Z57.2   7. Financial difficulties  Z59.8    He still having ongoing symptoms.  I suspect the symptoms are actually carcinoid driving asthma-like symptoms and causing asthma mimic.  But it is also possible that with history of seasonal allergies and male being 52 and occupational exposure to dust that he has true asthma symptoms.  It is hard to discern.  The presence of hemoptysis is concerning that this mass is still persistent.  Therefore I think he might just benefit from direct resection as opposed to biopsy first.  Therefore I will make a referral to [name] the surgeon who can decide about biopsy first versus direct resection.  I have educated the patient that he will need to see the surgeon as soon as possible this month and then can schedule the  surgery versus biopsy based on surgeon's opinion.    Meanwhile he has been tolerating work with some symptoms  therefore I told him that he can continue to work but recommended an N95 mask or respirator.  Also recommended he take albuterol pre exertion.  We will commit him to [facility].  It is possible the long-acting beta agonist can make carcinoid symptoms worse but we will monitor this.  In this case we can just switch him to direct inhaled steroids.  Encouraged him to get health insurance  He is i agreeable with the plan    Plan:     Patient Instructions  Mass of upper lobe of left lung Hemoptysis  - still smptomatic and I think the mass is still there  Plan  - refer Dr Roxan Hockey of [facility] in [place] for evaluaton for excision (he might have you do another CT scan)  Dyspnea on exertion Wheezing History of seasonal allergies Occupational exposure to dust - these symptoms  could all be due to mass as opposed to true asthma from allergies or fiberglass dust at work - glad albuterol has helped  Plan - check blood IgE -Hold off on blood allergy panel because of financial difficulties  - use albuterol as neeeded - take breoo (200 dose) sample and use it 1 puff daily regularly  - if symptioms getting worse call us  - use N95 (not KN95) or respirator at work with fiberglass (take note)  Financial Difficulties  Plan  - try to get health insurance; work with case Freight forwarder or call local health department for medicaid  Followup - 4 weeks in [place] office - face to face with Nurse Practitioner to review  -Especially pay attention if long-acting beta agonist and carcinoid is making symptoms worse  - needs interpreter         ( Level 05 visit: estb 40-54 min  in  visit type: on-site physical face to visit  in total care time and counseling or/and coordination of care by this undersigned MD - Dr Brand Males. This includes one or more of the following on this same day 09/12/2019: pre-charting, chart review, note writing, documentation discussion of test results, diagnostic or treatment  recommendations, prognosis, risks and benefits of management options, instructions, education, compliance or risk-factor reduction. It excludes time spent by the [place] or office staff in the care of the patient. Actual time 40 min)    SIGNATURE    Dr. Brand Males, M.D., F.C.C.P,  Pulmonary and Critical Care Medicine Staff Physician, [place] Director - Interstitial Lung Disease  Program  Pulmonary [place] at [place], [place], [address]  Pager: [phone], If no answer or between  15:00h - 7:00h: call [phone] Telephone: [phone]  10:34 AM 09/12/2019

## 2019-09-12 NOTE — Telephone Encounter (Signed)
Saw him today 

## 2019-09-16 ENCOUNTER — Institutional Professional Consult (permissible substitution) (INDEPENDENT_AMBULATORY_CARE_PROVIDER_SITE_OTHER): Payer: Self-pay | Admitting: Thoracic Surgery (Cardiothoracic Vascular Surgery)

## 2019-09-16 ENCOUNTER — Other Ambulatory Visit: Payer: Self-pay

## 2019-09-16 VITALS — BP 140/87 | HR 78 | Resp 20 | Ht 69.0 in | Wt 192.0 lb

## 2019-09-16 DIAGNOSIS — R918 Other nonspecific abnormal finding of lung field: Secondary | ICD-10-CM

## 2019-09-16 NOTE — Progress Notes (Signed)
PCP is Patient, No Pcp Per Referring Provider is Ramaswamy, Murali, MD      Chief Complaint  Patient presents with  . Lung Lesion    Surgical eval, PET Scan 08/21/19, Chest CT 07/29/19-[facility]   Eduardo Moore is accompanied by an interpreter Claudia Gaytan.  He speaks good English but she did interpret medical issues for him.  HPI: Eduardo Moore was sent for consultation regarding a left lung nodule  Eduardo Moore is a 39-year-old Hispanic male with a history of allergies and wheezing.  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  He started noticing wheezing mostly in the spring a few years ago.  He presented this year with worsening wheezing and pain in his left back with deep inspiration.  He was treated with a Z-Pak and his wheezing improved to some degree.  Along that time he had a chest x-ray which showed a left upper lobe lung mass.  A CT of the chest showed a 3 x 2.3 cm masslike lesion either in the superior segment of the lower lobe or the posterior segment of the upper lobe.  He was seen by Dr. Ramaswamy.  QuantiFERON gold test was negative.  A PET/CT showed only mild metabolic uptake with an SUV of 3.3.  There is no evidence of regional or distant metastasis.  Differential diagnosis included carcinoid tumor versus infection or some unusual inflammatory mass.  Garden-variety lung cancer very unlikely given his young age and non-smoker.  He saw Dr. Ramaswamy again on 09/12/2019.  He was complaining of hemoptysis.  Given that he referred him for surgical resection.  Past medical history Allergies Asthma   No past surgical history on file.       Family History  Problem Relation Age of Onset  . Asthma Mother     Social History Social History        Tobacco Use  . Smoking status: Never Smoker  . Smokeless tobacco: Never Used  Substance Use Topics  . Alcohol use: Yes    Comment: occasional  . Drug use: Never          Current  Outpatient Medications  Medication Sig Dispense Refill  . fluticasone furoate-vilanterol (BREO ELLIPTA) 200-25 MCG/INH AEPB Inhale 1 puff into the lungs daily.    . guaiFENesin (MUCINEX) 600 MG 12 hr tablet Take by mouth 2 (two) times daily.     No current facility-administered medications for this visit.         Allergies  Allergen Reactions  . Other Shortness Of Breath and Rash    Reaction to green peas and lentils    Review of Systems  Constitutional: Positive for unexpected weight change (Has gained 5 pounds in 3 months). Negative for activity change and appetite change.  HENT: Negative for trouble swallowing and voice change.   Eyes: Negative for visual disturbance.  Respiratory: Positive for wheezing. Negative for shortness of breath.        Pleuritic pain with deep breath.  Hemoptysis  Cardiovascular: Negative for chest pain and leg swelling.  Gastrointestinal: Negative for abdominal distention and abdominal pain.  Genitourinary: Negative for difficulty urinating and dysuria.  Neurological: Positive for numbness (In hands, 2 years ago, resolved).  Hematological: Negative for adenopathy. Does not bruise/bleed easily.  Psychiatric/Behavioral: The patient is nervous/anxious.   All other systems reviewed and are negative.   BP 140/87   Pulse 78   Resp 20   Ht 5' 9" (1.753 m)     Wt 192 lb (87.1 kg)   SpO2 100% Comment: RA  BMI 28.35 kg/m  Physical Exam Vitals reviewed.  Constitutional:      General: He is not in acute distress.    Appearance: Normal appearance.  HENT:     Head: Normocephalic and atraumatic.  Eyes:     General: No scleral icterus.    Extraocular Movements: Extraocular movements intact.  Cardiovascular:     Rate and Rhythm: Normal rate and regular rhythm.     Pulses: Normal pulses.     Heart sounds: Normal heart sounds. No murmur heard.  No friction rub. No gallop.   Pulmonary:     Effort: Pulmonary effort is normal. No respiratory  distress.     Breath sounds: Normal breath sounds. No wheezing.  Abdominal:     General: There is no distension.     Palpations: Abdomen is soft.     Tenderness: There is no abdominal tenderness.  Musculoskeletal:        General: No swelling.     Cervical back: Neck supple. No rigidity.  Lymphadenopathy:     Cervical: No cervical adenopathy.  Skin:    General: Skin is warm and dry.  Neurological:     General: No focal deficit present.     Mental Status: He is alert and oriented to person, place, and time.     Cranial Nerves: No cranial nerve deficit.     Motor: No weakness.     Gait: Gait normal.    Diagnostic Tests: NUCLEAR MEDICINE PET SKULL BASE TO THIGH  TECHNIQUE: 9.6 mCi F-18 FDG was injected intravenously. Full-ring PET imaging was performed from the skull base to thigh after the radiotracer. CT data was obtained and used for attenuation correction and anatomic localization.  Fasting blood glucose: 96 mg/dl  COMPARISON: None.  FINDINGS: Mediastinal blood pool activity: SUV max 2.7  Liver activity: SUV max NA  NECK: No hypermetabolic lymph nodes in the neck.  Incidental CT findings: none  CHEST: The superior segment of the LEFT lower lobe rounded nodule measuring 2.6 by 2.7 cm. Nodule has mild peripheral metabolic activity SUV max equal 3.3. Centrally the lesion is near fluid density. No comparison exam.  Prominent LEFT hilar lymph node measuring 9 mm has minimal metabolic activity SUV max equal 2.3.  No central hypermetabolic mediastinal lymph nodes. No additional pulmonary nodules.  Incidental CT findings: none  ABDOMEN/PELVIS: No abnormal hypermetabolic activity within the liver, pancreas, adrenal glands, or spleen. No hypermetabolic lymph nodes in the abdomen or pelvis.  Incidental CT findings: none  SKELETON: No focal hypermetabolic activity to suggest skeletal metastasis.  Incidental CT findings: none  IMPRESSION: 1. Low  peripheral metabolic activity associated with the LEFT lower lobe rounded pulmonary nodule. The relatively low metabolic activity for size and central fluid density favor benign infectious process. Cannot exclude a low metabolically active lesion such is mucinous carcinoma. Recommend follow-up CT of the thorax with contrast in 1-3 months to demonstrate reduction in volume. 2. Prominent LEFT hilar lymph node is likely reactive. 3. No distant metastatic disease. 4. No evidence of primary malignancy elsewhere on the whole-body scan.   Electronically Signed By: Stewart Edmunds M.D. On: 08/21/2019 16:34  I personally reviewed the PET/CT images and concur with the findings noted above  Pulmonary function testing FVC 4.70 (93%) FEV1 3.60 (89%) TLC 6.10 (92%) DLCO 34.70 (117%)   Impression: Eduardo Moore is a 39-year-old previously healthy man who presented with left-sided pleuritic chest pain, wheezing,   and hemoptysis.  A chest x-ray showed a left lung nodule.  A CT done at an outside facility showed a 3.0 x 2.3 cm round nodule in the superior segment of the lower lobe (versus posterior upper lobe).  On PET CT there was mild hypermetabolic activity with SUV of 3.3.  There was no regional or distant metastatic disease.  Differential diagnosis includes primary bronchogenic carcinoma, carcinoid tumor, as well as infectious and inflammatory nodules.  Of these either carcinoid tumor or infection are the most likely.  In either event I think given the size of the nodule the best option would be to go ahead and remove it.  This would be amenable to bronchoscopic biopsy, but that may be nondiagnostic and given the ongoing hemoptysis the nodule probably needs to be removed regardless.  I described the proposed operation of robotic left VATS for wedge resection and possible lobectomy with Eduardo Moore.  I informed him of the general nature of the procedure including the need for  general anesthesia, the incisions to be used, use of a drainage tube postoperatively, the expected hospital stay, and the overall recovery.  I informed him of the indications, risks, benefits, and alternatives.  He understands the risks include, but not limited to death, MI, DVT, PE, bleeding, possible need for transfusion, infection, air leak, irregular heart rhythms, as well as possibility of other unforeseeable complications.  He accepts the risks and agrees to proceed.  Plan: Robotic left VATS for wedge resection possible lobectomy on Thursday 10/02/2019.  I spent 45 minutes in review of images, records, and consultation with Eduardo Moore today. Nilah Belcourt C Lashanti Chambless, MD [facility] [phone]  

## 2019-09-17 ENCOUNTER — Encounter: Payer: Self-pay | Admitting: *Deleted

## 2019-09-17 ENCOUNTER — Other Ambulatory Visit: Payer: Self-pay | Admitting: *Deleted

## 2019-09-17 DIAGNOSIS — R911 Solitary pulmonary nodule: Secondary | ICD-10-CM

## 2019-09-18 LAB — IGE: IgE (Immunoglobulin E), Serum: 238 IU/mL (ref 6–495)

## 2019-09-22 ENCOUNTER — Telehealth: Payer: Self-pay | Admitting: Internal Medicine

## 2019-09-22 NOTE — Telephone Encounter (Signed)
  Call from patient Eduardo Moore last week -> lot of social issues: he wanted me to update his realtor Ms Lowell Guitar because he was struggling between job and buying house. So she can be fully informed and help him plan his purchase  Then he call me 5:27 PM 09/22/2019 > says he is quitting current job. Not going to have surgery in [place]. Will move to [place], [place] within few weeks. He wants paper record of my note  Plan  - print my 2 or 3 office notes and have it in envelope for him   - pleease give him instructions for picking up CD rom for PET scan June 2021 and CT scan chest July 2021  - [email or web address] - pls print him information for Dr Jamison Neighbor  - I instructed patient to infrm Dr [name] directly about his  plan

## 2019-09-23 NOTE — Telephone Encounter (Signed)
Patient calling regarding picking up records and CD.  See note from Dr. [name]. Patient requesting to pick these up 7/14.  [phone].  Please advise.

## 2019-09-24 ENCOUNTER — Other Ambulatory Visit: Payer: Self-pay | Admitting: *Deleted

## 2019-09-25 ENCOUNTER — Other Ambulatory Visit: Payer: Self-pay | Admitting: *Deleted

## 2019-09-25 ENCOUNTER — Encounter: Payer: Self-pay | Admitting: *Deleted

## 2019-09-25 DIAGNOSIS — R911 Solitary pulmonary nodule: Secondary | ICD-10-CM

## 2019-09-25 NOTE — Telephone Encounter (Signed)
Called and spoke with pt letting him know to contact medical records dept as they can help him get records and pt verbalized understanding. Pt stated that he can get disc from Dr. [name]. nothing further needed.

## 2019-09-29 ENCOUNTER — Other Ambulatory Visit: Payer: Self-pay | Admitting: Thoracic Surgery (Cardiothoracic Vascular Surgery)

## 2019-09-29 DIAGNOSIS — R911 Solitary pulmonary nodule: Secondary | ICD-10-CM

## 2019-09-29 NOTE — Pre-Procedure Instructions (Signed)
[facility] [address] [place] - [address] [place] [address] Phone: [phone] Fax: [phone]      Your procedure is scheduled on Thursday, July 22nd.  Report to [facility] Main Entrance "A" at 6:00 A.M., and check in at the Admitting office.  Call this number if you have problems the morning of surgery:  [phone]  Call [phone] if you have any questions prior to your surgery date Monday-Friday 8am-4pm    Remember:  Do not eat or drink after midnight the night before your surgery     Take these medicines the morning of surgery with A SIP OF WATER:  Guaifenesin (MUCINEX MAXIMUM STRENGTH) fluticasone furoate-vilanterol (BREO ELLIPTA) - Albuterol Inhaler-if needed for wheezing or shortness of breath. Please bring with you on the day of surgery.   As of today, STOP taking any Aspirin (unless otherwise instructed by your surgeon) Aleve, Naproxen, Ibuprofen, Motrin, Advil, Goody's, BC's, all herbal medications, fish oil, and all vitamins.             Do not wear jewelry            Do not wear lotions, powders, colognes, or deodorant.            Men may shave face and neck.            Do not bring valuables to the hospital.            [facility] is not responsible for any belongings or valuables.  Do NOT Smoke (Tobacco/Vaping) or drink Alcohol 24 hours prior to your procedure If you use a CPAP at night, you may bring all equipment for your overnight stay.   Contacts, glasses, dentures or bridgework may not be worn into surgery.      For patients admitted to the hospital, discharge time will be determined by your treatment team.   Patients discharged the day of surgery will not be allowed to drive home, and someone needs to stay with them for 24 hours.    Special instructions:   [place]- Preparing For Surgery  Before surgery, you can play an important role. Because skin is not sterile, your skin needs to be as  free of germs as possible. You can reduce the number of germs on your skin by washing with CHG (chlorahexidine gluconate) Soap before surgery.  CHG is an antiseptic cleaner which kills germs and bonds with the skin to continue killing germs even after washing.    Oral Hygiene is also important to reduce your risk of infection.  Remember - BRUSH YOUR TEETH THE MORNING OF SURGERY WITH YOUR REGULAR TOOTHPASTE  Please do not use if you have an allergy to CHG or antibacterial soaps. If your skin becomes reddened/irritated stop using the CHG.  Do not shave (including legs and underarms) for at least 48 hours prior to first CHG shower. It is OK to shave your face.  Please follow these instructions carefully.   1. Shower the NIGHT BEFORE SURGERY and the MORNING OF SURGERY with CHG Soap.   2. If you chose to wash your hair, wash your hair first as usual with your normal shampoo.  3. After you shampoo, rinse your hair and body thoroughly to remove the shampoo.  4. Use CHG as you would any other liquid soap. You can apply CHG directly to the skin and wash gently with a scrungie or a clean washcloth.   5. Apply the CHG Soap to your  body ONLY FROM THE NECK DOWN.  Do not use on open wounds or open sores. Avoid contact with your eyes, ears, mouth and genitals (private parts). Wash Face and genitals (private parts)  with your normal soap.   6. Wash thoroughly, paying special attention to the area where your surgery will be performed.  7. Thoroughly rinse your body with warm water from the neck down.  8. DO NOT shower/wash with your normal soap after using and rinsing off the CHG Soap.  9. Pat yourself dry with a CLEAN TOWEL.  10. Wear CLEAN PAJAMAS to bed the night before surgery  11. Place CLEAN SHEETS on your bed the night of your first shower and DO NOT SLEEP WITH PETS.   Day of Surgery: Wear Clean/Comfortable clothing the morning of surgery Do not apply any deodorants/lotions.   Remember to  brush your teeth WITH YOUR REGULAR TOOTHPASTE.   Please read over the following fact sheets that you were given.

## 2019-09-30 ENCOUNTER — Ambulatory Visit
Admission: RE | Admit: 2019-09-30 | Discharge: 2019-09-30 | Disposition: A | Discharge status: Home / Self Care | Payer: Self-pay | Source: Ambulatory Visit | Attending: Thoracic Surgery (Cardiothoracic Vascular Surgery) | Admitting: Thoracic Surgery (Cardiothoracic Vascular Surgery)

## 2019-09-30 ENCOUNTER — Other Ambulatory Visit: Payer: Self-pay

## 2019-09-30 ENCOUNTER — Inpatient Hospital Stay (HOSPITAL_COMMUNITY): Admission: RE | Admit: 2019-09-30 | Payer: Self-pay | Source: Ambulatory Visit

## 2019-09-30 ENCOUNTER — Other Ambulatory Visit (HOSPITAL_COMMUNITY): Payer: Self-pay

## 2019-09-30 ENCOUNTER — Ambulatory Visit (HOSPITAL_COMMUNITY)
Admission: RE | Admit: 2019-09-30 | Discharge: 2019-09-30 | Disposition: A | Discharge status: Home / Self Care | Payer: Self-pay | Source: Ambulatory Visit | Attending: Thoracic Surgery (Cardiothoracic Vascular Surgery) | Admitting: Thoracic Surgery (Cardiothoracic Vascular Surgery)

## 2019-09-30 ENCOUNTER — Encounter (HOSPITAL_COMMUNITY): Payer: Self-pay

## 2019-09-30 ENCOUNTER — Encounter (HOSPITAL_COMMUNITY)
Admission: RE | Admit: 2019-09-30 | Discharge: 2019-09-30 | Disposition: A | Discharge status: Home / Self Care | Payer: Self-pay | Source: Ambulatory Visit | Attending: Thoracic Surgery (Cardiothoracic Vascular Surgery) | Admitting: Thoracic Surgery (Cardiothoracic Vascular Surgery)

## 2019-09-30 ENCOUNTER — Other Ambulatory Visit (HOSPITAL_COMMUNITY)
Admission: RE | Admit: 2019-09-30 | Discharge: 2019-09-30 | Disposition: A | Discharge status: Home / Self Care | Payer: Self-pay | Source: Ambulatory Visit | Attending: Thoracic Surgery (Cardiothoracic Vascular Surgery) | Admitting: Thoracic Surgery (Cardiothoracic Vascular Surgery)

## 2019-09-30 DIAGNOSIS — R911 Solitary pulmonary nodule: Secondary | ICD-10-CM | POA: Insufficient documentation

## 2019-09-30 DIAGNOSIS — Z01818 Encounter for other preprocedural examination: Secondary | ICD-10-CM | POA: Insufficient documentation

## 2019-09-30 DIAGNOSIS — Z20822 Contact with and (suspected) exposure to covid-19: Secondary | ICD-10-CM | POA: Insufficient documentation

## 2019-09-30 LAB — COMPREHENSIVE METABOLIC PANEL
ALT: 27 U/L (ref 0–44)
AST: 18 U/L (ref 15–41)
Albumin: 4 g/dL (ref 3.5–5.0)
Alkaline Phosphatase: 71 U/L (ref 38–126)
Anion gap: 8 (ref 5–15)
BUN: 11 mg/dL (ref 6–20)
CO2: 25 mmol/L (ref 22–32)
Calcium: 9.3 mg/dL (ref 8.9–10.3)
Chloride: 105 mmol/L (ref 98–111)
Creatinine, Ser: 0.89 mg/dL (ref 0.61–1.24)
GFR calc Af Amer: >60 mL/min (ref >60)
GFR calc non Af Amer: >60 mL/min (ref >60)
Glucose, Bld: 106 mg/dL — ABNORMAL HIGH (ref 70–99)
Potassium: 3.9 mmol/L (ref 3.5–5.1)
Sodium: 138 mmol/L (ref 135–145)
Total Bilirubin: 1.2 mg/dL (ref 0.3–1.2)
Total Protein: 6.9 g/dL (ref 6.5–8.1)

## 2019-09-30 LAB — CBC
HCT: 42.3 % (ref 39.0–52.0)
Hemoglobin: 13.8 g/dL (ref 13.0–17.0)
MCH: 29.4 pg (ref 26.0–34.0)
MCHC: 32.6 g/dL (ref 30.0–36.0)
MCV: 90.2 fL (ref 80.0–100.0)
Platelets: 205 10*3/uL (ref 150–400)
RBC: 4.69 MIL/uL (ref 4.22–5.81)
RDW: 12.3 % (ref 11.5–15.5)
WBC: 7.4 10*3/uL (ref 4.0–10.5)
nRBC: 0 % (ref 0.0–0.2)

## 2019-09-30 LAB — APTT: aPTT: 32 seconds (ref 24–36)

## 2019-09-30 LAB — PROTIME-INR
INR: 1.1 (ref 0.8–1.2)
Prothrombin Time: 13.5 seconds (ref 11.4–15.2)

## 2019-09-30 LAB — SARS CORONAVIRUS 2 (TAT 6-24 HRS): SARS Coronavirus 2: NEGATIVE

## 2019-09-30 NOTE — Progress Notes (Signed)
PCP - denies Cardiologist -denies   Chest x-ray - 09/30/19 EKG - N/A Stress Test - denies ECHO - denies Cardiac Cath - denies  Sleep Study - denies CPAP - denies  Blood Thinner Instructions: N/A Aspirin Instructions:N/A  ERAS Protcol -N/A PRE-SURGERY Ensure or G2- N/A  COVID TEST- Pt went before PAT appointment.   Anesthesia review: No  Patient denies shortness of breath, fever, cough and chest pain at PAT appointment   All instructions explained to the patient, with a verbal understanding of the material. Patient agrees to go over the instructions while at home for a better understanding. Patient also instructed to self quarantine after being tested for COVID-19. The opportunity to ask questions was provided.   Coronavirus Screening  Have you experienced the following symptoms:  Cough yes/no: No Fever (>100.34F)  yes/no: No Runny nose yes/no: No Sore throat yes/no: No Difficulty breathing/shortness of breath  yes/no: No  Have you or a family member traveled in the last 14 days and where? yes/no: No   If the patient indicates "YES" to the above questions, their PAT will be rescheduled to limit the exposure to others and, the surgeon will be notified. THE PATIENT WILL NEED TO BE ASYMPTOMATIC FOR 14 DAYS.   If the patient is not experiencing any of these symptoms, the PAT nurse will instruct them to NOT bring anyone with them to their appointment since they may have these symptoms or traveled as well.   Please remind your patients and families that hospital visitation restrictions are in effect and the importance of the restrictions.

## 2019-10-02 ENCOUNTER — Ambulatory Visit (HOSPITAL_COMMUNITY): Payer: Self-pay | Admitting: Anesthesiology

## 2019-10-02 ENCOUNTER — Ambulatory Visit (HOSPITAL_COMMUNITY): Payer: Self-pay

## 2019-10-02 ENCOUNTER — Other Ambulatory Visit: Payer: Self-pay

## 2019-10-02 ENCOUNTER — Ambulatory Visit (HOSPITAL_COMMUNITY)
Admission: RE | Admit: 2019-10-02 | Discharge: 2019-10-02 | Disposition: A | Discharge status: Home / Self Care | Payer: Self-pay | Attending: Thoracic Surgery (Cardiothoracic Vascular Surgery) | Admitting: Thoracic Surgery (Cardiothoracic Vascular Surgery)

## 2019-10-02 ENCOUNTER — Encounter (HOSPITAL_COMMUNITY): Payer: Self-pay | Admitting: Thoracic Surgery (Cardiothoracic Vascular Surgery)

## 2019-10-02 ENCOUNTER — Inpatient Hospital Stay: Admit: 2019-10-02 | Payer: Self-pay | Admitting: Thoracic Surgery (Cardiothoracic Vascular Surgery)

## 2019-10-02 ENCOUNTER — Encounter (HOSPITAL_COMMUNITY)
Admission: RE | Disposition: A | Discharge status: Home / Self Care | Payer: Self-pay | Source: Home / Self Care | Attending: Thoracic Surgery (Cardiothoracic Vascular Surgery)

## 2019-10-02 DIAGNOSIS — Z7951 Long term (current) use of inhaled steroids: Secondary | ICD-10-CM | POA: Insufficient documentation

## 2019-10-02 DIAGNOSIS — R911 Solitary pulmonary nodule: Secondary | ICD-10-CM

## 2019-10-02 DIAGNOSIS — Z91018 Allergy to other foods: Secondary | ICD-10-CM | POA: Insufficient documentation

## 2019-10-02 DIAGNOSIS — B441 Other pulmonary aspergillosis: Secondary | ICD-10-CM | POA: Insufficient documentation

## 2019-10-02 DIAGNOSIS — Z419 Encounter for procedure for purposes other than remedying health state, unspecified: Secondary | ICD-10-CM

## 2019-10-02 HISTORY — PX: VIDEO BRONCHOSCOPY WITH ENDOBRONCHIAL NAVIGATION: SHX6175

## 2019-10-02 SURGERY — VIDEO BRONCHOSCOPY WITH ENDOBRONCHIAL NAVIGATION
Anesthesia: General

## 2019-10-02 SURGERY — WEDGE RESECTION, LUNG, ROBOT-ASSISTED, THORACOSCOPIC
Anesthesia: General | Site: Chest | Laterality: Left

## 2019-10-02 MED ORDER — 0.9 % SODIUM CHLORIDE (POUR BTL) OPTIME
TOPICAL | Status: DC | PRN
  Administered 2019-10-02: 1000 mL

## 2019-10-02 MED ORDER — PROMETHAZINE HCL 25 MG/ML IJ SOLN: 6.2500 mg | INTRAMUSCULAR | Status: DC | PRN

## 2019-10-02 MED ORDER — ROCURONIUM BROMIDE 10 MG/ML (PF) SYRINGE
PREFILLED_SYRINGE | INTRAVENOUS | Status: DC | PRN
  Administered 2019-10-02: 60 mg via INTRAVENOUS

## 2019-10-02 MED ORDER — FENTANYL CITRATE (PF) 100 MCG/2ML IJ SOLN: 25.0000 ug | INTRAMUSCULAR | Status: DC | PRN

## 2019-10-02 MED ORDER — PHENYLEPHRINE 40 MCG/ML (10ML) SYRINGE FOR IV PUSH (FOR BLOOD PRESSURE SUPPORT)
PREFILLED_SYRINGE | INTRAVENOUS | Status: DC | PRN
  Administered 2019-10-02: 40 ug via INTRAVENOUS

## 2019-10-02 MED ORDER — MIDAZOLAM HCL 2 MG/2ML IJ SOLN: 0.5000 mg | Freq: Once | INTRAMUSCULAR | Status: DC | PRN

## 2019-10-02 MED ORDER — ACETAMINOPHEN 500 MG PO TABS
1000.0000 mg | ORAL_TABLET | Freq: Once | ORAL | Status: AC
  Administered 2019-10-02: 1000 mg via ORAL
  Filled 2019-10-02: qty 2

## 2019-10-02 MED ORDER — OXYCODONE HCL 5 MG PO TABS: 5.0000 mg | ORAL_TABLET | Freq: Once | ORAL | Status: DC | PRN

## 2019-10-02 MED ORDER — LIDOCAINE 2% (20 MG/ML) 5 ML SYRINGE
INTRAMUSCULAR | Status: DC | PRN
  Administered 2019-10-02: 80 mg via INTRAVENOUS

## 2019-10-02 MED ORDER — EPINEPHRINE PF 1 MG/ML IJ SOLN
INTRAMUSCULAR | Status: DC | PRN
  Administered 2019-10-02: 1 mg via ENDOTRACHEOPULMONARY

## 2019-10-02 MED ORDER — LACTATED RINGERS IV SOLN: INTRAVENOUS | Status: DC

## 2019-10-02 MED ORDER — MEPERIDINE HCL 25 MG/ML IJ SOLN: 6.2500 mg | INTRAMUSCULAR | Status: DC | PRN

## 2019-10-02 MED ORDER — PROPOFOL 10 MG/ML IV BOLUS
INTRAVENOUS | Status: DC | PRN
  Administered 2019-10-02: 150 mg via INTRAVENOUS

## 2019-10-02 MED ORDER — FENTANYL CITRATE (PF) 250 MCG/5ML IJ SOLN
INTRAMUSCULAR | Status: DC | PRN
  Administered 2019-10-02: 100 ug via INTRAVENOUS
  Administered 2019-10-02 (×2): 25 ug via INTRAVENOUS
  Administered 2019-10-02 (×2): 50 ug via INTRAVENOUS

## 2019-10-02 MED ORDER — DEXAMETHASONE SODIUM PHOSPHATE 10 MG/ML IJ SOLN
INTRAMUSCULAR | Status: DC | PRN
  Administered 2019-10-02: 5 mg via INTRAVENOUS

## 2019-10-02 MED ORDER — CHLORHEXIDINE GLUCONATE 0.12 % MT SOLN
15.0000 mL | Freq: Once | OROMUCOSAL | Status: AC
  Filled 2019-10-02: qty 15

## 2019-10-02 MED ORDER — SUGAMMADEX SODIUM 200 MG/2ML IV SOLN
INTRAVENOUS | Status: DC | PRN
  Administered 2019-10-02: 200 mg via INTRAVENOUS

## 2019-10-02 MED ORDER — ONDANSETRON HCL 4 MG/2ML IJ SOLN
INTRAMUSCULAR | Status: DC | PRN
  Administered 2019-10-02: 4 mg via INTRAVENOUS

## 2019-10-02 MED ORDER — ORAL CARE MOUTH RINSE
15.0000 mL | Freq: Once | OROMUCOSAL | Status: AC
  Administered 2019-10-02: 15 mL via OROMUCOSAL

## 2019-10-02 MED ORDER — OXYCODONE HCL 5 MG/5ML PO SOLN: 5.0000 mg | Freq: Once | ORAL | Status: DC | PRN

## 2019-10-02 MED ORDER — MIDAZOLAM HCL 2 MG/2ML IJ SOLN
INTRAMUSCULAR | Status: DC | PRN
  Administered 2019-10-02: 2 mg via INTRAVENOUS

## 2019-10-02 SURGICAL SUPPLY — 44 items
ADAPTER BRONCHOSCOPE OLYMPUS (ADAPTER) ×3 IMPLANT
BLADE CLIPPER SURG (BLADE) ×3 IMPLANT
BRUSH BIOPSY BRONCH 10 SDTNB (MISCELLANEOUS) ×2 IMPLANT
BRUSH BIOPSY BRONCH 10MM SDTNB (MISCELLANEOUS) ×1
BRUSH SUPERTRAX BIOPSY (INSTRUMENTS) ×3 IMPLANT
BRUSH SUPERTRAX NDL-TIP CYTO (INSTRUMENTS) ×3 IMPLANT
CANISTER SUCT 3000ML PPV (MISCELLANEOUS) ×3 IMPLANT
CNTNR URN SCR LID CUP LEK RST (MISCELLANEOUS) ×2 IMPLANT
CONT SPEC 4OZ STRL OR WHT (MISCELLANEOUS) ×4
COVER BACK TABLE 60X90IN (DRAPES) ×3 IMPLANT
FILTER STRAW FLUID ASPIR (MISCELLANEOUS) ×3 IMPLANT
FORCEPS BIOP SUPERTRX PREMAR (INSTRUMENTS) ×3 IMPLANT
GAUZE SPONGE 4X4 12PLY STRL (GAUZE/BANDAGES/DRESSINGS) ×3 IMPLANT
GLOVE BIO SURGEON STRL SZ 6 (GLOVE) ×3 IMPLANT
GLOVE BIO SURGEON STRL SZ 6.5 (GLOVE) ×2 IMPLANT
GLOVE BIO SURGEONS STRL SZ 6.5 (GLOVE) ×1
GLOVE SURG SIGNA 7.5 PF LTX (GLOVE) ×3 IMPLANT
GOWN STRL REUS W/ TWL LRG LVL3 (GOWN DISPOSABLE) ×2 IMPLANT
GOWN STRL REUS W/ TWL XL LVL3 (GOWN DISPOSABLE) ×1 IMPLANT
GOWN STRL REUS W/TWL LRG LVL3 (GOWN DISPOSABLE) ×4
GOWN STRL REUS W/TWL XL LVL3 (GOWN DISPOSABLE) ×2
KIT CLEAN ENDO COMPLIANCE (KITS) ×3 IMPLANT
KIT ILLUMISITE 180 PROCEDURE (KITS) ×3 IMPLANT
KIT TURNOVER KIT B (KITS) ×3 IMPLANT
MARKER SKIN DUAL TIP RULER LAB (MISCELLANEOUS) ×3 IMPLANT
NEEDLE SUPERTRX PREMARK BIOPSY (NEEDLE) ×3 IMPLANT
NS IRRIG 1000ML POUR BTL (IV SOLUTION) ×3 IMPLANT
OIL SILICONE PENTAX (PARTS (SERVICE/REPAIRS)) ×3 IMPLANT
PAD ARMBOARD 7.5X6 YLW CONV (MISCELLANEOUS) ×6 IMPLANT
PATCHES PATIENT (LABEL) ×9 IMPLANT
SYR 20ML ECCENTRIC (SYRINGE) ×3 IMPLANT
SYR 20ML LL LF (SYRINGE) ×3 IMPLANT
SYR 30ML LL (SYRINGE) ×3 IMPLANT
SYR 5ML LL (SYRINGE) ×3 IMPLANT
TOWEL GREEN STERILE (TOWEL DISPOSABLE) ×3 IMPLANT
TOWEL GREEN STERILE FF (TOWEL DISPOSABLE) ×3 IMPLANT
TRAP SPECIMEN MUCUS 40CC (MISCELLANEOUS) ×3 IMPLANT
TUBE CONNECTING 20'X1/4 (TUBING) ×2
TUBE CONNECTING 20X1/4 (TUBING) ×4 IMPLANT
UNDERPAD 30X36 HEAVY ABSORB (UNDERPADS AND DIAPERS) ×3 IMPLANT
VALVE BIOPSY  SINGLE USE (MISCELLANEOUS) ×2
VALVE BIOPSY SINGLE USE (MISCELLANEOUS) ×1 IMPLANT
VALVE SUCTION BRONCHIO DISP (MISCELLANEOUS) ×3 IMPLANT
WATER STERILE IRR 1000ML POUR (IV SOLUTION) ×3 IMPLANT

## 2019-10-02 NOTE — Anesthesia Postprocedure Evaluation (Signed)
Anesthesia Post Note  Patient: Eduardo Moore  Procedure(s) Performed: VIDEO BRONCHOSCOPY WITH ENDOBRONCHIAL NAVIGATION (N/A )     Patient location during evaluation: PACU Anesthesia Type: General Level of consciousness: awake and alert Pain management: pain level controlled Vital Signs Assessment: post-procedure vital signs reviewed and stable Respiratory status: spontaneous breathing, nonlabored ventilation, respiratory function stable and patient connected to nasal cannula oxygen Cardiovascular status: blood pressure returned to baseline and stable Postop Assessment: no apparent nausea or vomiting Anesthetic complications: no   No complications documented.  Last Vitals:  Vitals:   10/02/19 1320 10/02/19 1332  BP: 128/84 114/74  Pulse: 68 70  Resp: 14 19  Temp:  (!) 36.1 C  SpO2: 92% 99%    Last Pain:  Vitals:   10/02/19 1332  TempSrc:   PainSc: 0-No pain                 Cecile Hearing

## 2019-10-02 NOTE — Op Note (Signed)
Eduardo Moore, DOBLER MEDICAL RECORD [id number] ACCOUNT [id number] DATE OF BIRTH:1980/06/18 FACILITY: [facility] LOCATION: [facility] PHYSICIAN:Larron Armor [name], MD  OPERATIVE REPORT  DATE OF PROCEDURE:  10/02/2019  PREOPERATIVE DIAGNOSIS:  Left lower lobe lung nodule.  POSTOPERATIVE DIAGNOSIS:  Left lower lobe lung nodule.  PROCEDURE:  Electromagnetic navigational bronchoscopy with needle aspirations, brushings and transbronchial biopsies.  SURGEON:  Modesto Charon, MD  ASSISTANT:  None.  ANESTHESIA:  General.  FINDINGS:  Initial preps on needle aspirations and brushings both showed extensive acute inflammation.  No definite granulomas seen.  No evidence of malignancy.  CLINICAL NOTE:  The patient is a 39 year old man with no significant past medical history.  He presented with worsening wheezing and pain in his left back with deep inspiration.  He had a chest x-ray which showed a left lung mass.  On CT, there was a 3 x  2.3 cm lesion in the superior segment of the lower lobe.  A QuantiFERON Gold test was negative.  PET CT showed mild uptake with an SUV of 3.3.  He was originally scheduled to have surgical resection, but wished to have a navigational bronchoscopy for  biopsy in lieu of resection.  The indications, risks, benefits, and alternatives were discussed in detail with the patient.  He understood and accepted the risks and agreed to proceed.  DESCRIPTION OF PROCEDURE:  The patient was brought to the operating room on 10/02/2019.  He had induction of general anesthesia and was intubated.  A timeout was performed.  Flexible fiberoptic bronchoscopy was performed via the endotracheal tube.  It  revealed normal endobronchial anatomy with no endobronchial lesions to the level of the subsegmental bronchi.  There were some thick, clear secretions in the superior segmental bronchus.  Planning for the procedure had been done on the computer preoperatively.  The locatable  guide for navigation was placed and registration was performed.  The bronchoscope then was directed to the left lower lobe bronchus and the locatable guide was advanced into the superior segmental bronchus, into the  appropriate subsegmental bronchus and then to within a centimeter of the center of the lesion with good alignment.  Position was confirmed with fluoroscopy.  Sampling then was performed.  All sampling was performed with fluoroscopy.  Needle aspirations  were performed.  The first 2 were placed on the slides.  The third aspiration was placed only in the cell block.  Next, 3 needle brushings were performed and then multiple biopsies were obtained.  Biopsies were sent for both AFB and fungal cultures, as  well as for permanent pathology.  The quick preps on the needle aspirations and brushings showed acute inflammation, no tumor cells were seen, nor were there any definite granulomas seen.  There was some mild bleeding with the biopsies.  After obtaining  sufficient biopsies for both pathology and cultures,  bronchoalveolar lavage was performed. 100 mL of saline was instilled and 10 mL was obtained with aspiration.  This was sent for AFB and fungal cultures as well.  A final inspection was made with the  bronchoscope and there was no ongoing bleeding.  The total fluoroscopy time was 4.5 minutes with a total dose of 42 milligray.  The patient then was extubated in the operating room and taken to the [facility] in good condition.  VN/NUANCE  D:10/02/2019 T:10/02/2019 JOB:[id number]

## 2019-10-02 NOTE — Brief Op Note (Signed)
10/02/2019  1:14 PM  PATIENT:  Eduardo Moore  39 y.o. male  PRE-OPERATIVE DIAGNOSIS:  LEFT LUNG NODULE  POST-OPERATIVE DIAGNOSIS:  LEFT LUNG NODULE  PROCEDURE:  Procedure(s): VIDEO BRONCHOSCOPY WITH ENDOBRONCHIAL NAVIGATION (N/A) Needle aspirations, brushings and transbronchial biopsies  SURGEON:  Surgeon(s) and Role:    * Loreli Slot, MD - Primary  PHYSICIAN ASSISTANT:   ASSISTANTS: none   ANESTHESIA:   general  EBL:  5 mL   BLOOD ADMINISTERED:none  DRAINS: none   LOCAL MEDICATIONS USED:  NONE  SPECIMEN:  Source of Specimen:  LLL nodule  DISPOSITION OF SPECIMEN:  Path and micro  COUNTS:  NO endoscopic  TOURNIQUET:  * No tourniquets in log *  DICTATION: .Other Dictation: Dictation Number -  PLAN OF CARE: Discharge to home after PACU  PATIENT DISPOSITION:  PACU - hemodynamically stable.   Delay start of Pharmacological VTE agent (>24hrs) due to surgical blood loss or risk of bleeding: not applicable

## 2019-10-02 NOTE — Anesthesia Preprocedure Evaluation (Addendum)
Anesthesia Evaluation  Patient identified by MRN, date of birth, ID band Patient awake    Reviewed: Allergy & Precautions, NPO status , Patient's Chart, lab work & pertinent test results  Airway Mallampati: II  TM Distance: >3 FB Neck ROM: Full    Dental  (+) Teeth Intact, Dental Advisory Given Lower bridge :   Pulmonary  Lung mass 09/30/2019 SARS coronavirus NEG   Pulmonary exam normal breath sounds clear to auscultation       Cardiovascular negative cardio ROS Normal cardiovascular exam Rhythm:Regular Rate:Normal     Neuro/Psych negative neurological ROS  negative psych ROS   GI/Hepatic negative GI ROS, Neg liver ROS,   Endo/Other  negative endocrine ROS  Renal/GU negative Renal ROS     Musculoskeletal negative musculoskeletal ROS (+)   Abdominal   Peds  Hematology negative hematology ROS (+)   Anesthesia Other Findings Day of surgery medications reviewed with the patient.  Reproductive/Obstetrics                            Anesthesia Physical Anesthesia Plan  ASA: II  Anesthesia Plan: General   Post-op Pain Management:    Induction: Intravenous  PONV Risk Score and Plan: 2 and Ondansetron and Dexamethasone  Airway Management Planned: Oral ETT  Additional Equipment:   Intra-op Plan:   Post-operative Plan: Extubation in OR  Informed Consent: I have reviewed the patients History and Physical, chart, labs and discussed the procedure including the risks, benefits and alternatives for the proposed anesthesia with the patient or authorized representative who has indicated his/her understanding and acceptance.     Dental advisory given and Interpreter used for interveiw  Plan Discussed with: CRNA  Anesthesia Plan Comments:        Anesthesia Quick Evaluation

## 2019-10-02 NOTE — Transfer of Care (Signed)
Immediate Anesthesia Transfer of Care Note  Patient: Eduardo Moore  Procedure(s) Performed: VIDEO BRONCHOSCOPY WITH ENDOBRONCHIAL NAVIGATION (N/A )  Patient Location: PACU  Anesthesia Type:General  Level of Consciousness: drowsy and patient cooperative  Airway & Oxygen Therapy: Patient Spontanous Breathing and Patient connected to face mask oxygen  Post-op Assessment: Report given to RN and Post -op Vital signs reviewed and stable  Post vital signs: Reviewed and stable  Last Vitals:  Vitals Value Taken Time  BP 127/89 10/02/19 1304  Temp    Pulse 88 10/02/19 1307  Resp 21 10/02/19 1307  SpO2 97 % 10/02/19 1307  Vitals shown include unvalidated device data.  Last Pain:  Vitals:   10/02/19 1103  TempSrc:   PainSc: 0-No pain      Patients Stated Pain Goal: 4 (10/02/19 1103)  Complications: No complications documented.

## 2019-10-02 NOTE — Discharge Instructions (Addendum)
Do not drive or engage in heavy physical activity for 24 hours  You may resume normal activities tomorrow  You may cough up small amounts of blood over the next few days  You may use acetaminophen (Tylenol) if needed for discomfort. You may use an over the counter cough medication if needed.  Call [phone] if you develop chest pain, shortness of breath, fever > 101 F or cough up more than 2 tablespoons of blood  My office will arrange a follow up visit for you

## 2019-10-02 NOTE — Interval H&P Note (Signed)
History and Physical Interval Note:  10/02/2019 11:31 AM  Eduardo Moore  has presented today for surgery, with the diagnosis of LEFT LUNG NODULE.  The various methods of treatment have been discussed with the patient and family. After consideration of risks, benefits and other options for treatment, the patient has consented to  Procedure(s): VIDEO BRONCHOSCOPY WITH ENDOBRONCHIAL NAVIGATION (N/A) as a surgical intervention.  The patient's history has been reviewed, patient examined, no change in status, stable for surgery.  I have reviewed the patient's chart and labs.  Questions were answered to the patient's satisfaction.     Loreli Slot

## 2019-10-02 NOTE — Anesthesia Procedure Notes (Signed)
Procedure Name: Intubation Date/Time: 10/02/2019 11:56 AM Performed by: Modena Morrow, CRNA Pre-anesthesia Checklist: Patient identified, Emergency Drugs available, Suction available and Patient being monitored Patient Re-evaluated:Patient Re-evaluated prior to induction Oxygen Delivery Method: Circle system utilized Preoxygenation: Pre-oxygenation with 100% oxygen Induction Type: IV induction Ventilation: Mask ventilation without difficulty Laryngoscope Size: Miller and 3 Grade View: Grade I Tube type: Oral Tube size: 8.5 mm Number of attempts: 1 Airway Equipment and Method: Stylet and Oral airway Placement Confirmation: ETT inserted through vocal cords under direct vision,  positive ETCO2 and breath sounds checked- equal and bilateral Secured at: 24 cm Tube secured with: Tape Dental Injury: Teeth and Oropharynx as per pre-operative assessment

## 2019-10-03 ENCOUNTER — Encounter (HOSPITAL_COMMUNITY): Payer: Self-pay | Admitting: Thoracic Surgery (Cardiothoracic Vascular Surgery)

## 2019-10-03 LAB — ACID FAST SMEAR (AFB, MYCOBACTERIA)
Acid Fast Smear: NEGATIVE
Acid Fast Smear: NEGATIVE

## 2019-10-06 LAB — SURGICAL PATHOLOGY

## 2019-10-07 LAB — CYTOLOGY - NON PAP

## 2019-10-09 ENCOUNTER — Ambulatory Visit: Payer: Self-pay | Admitting: Internal Medicine

## 2019-10-09 NOTE — Progress Notes (Signed)
@Patient  ID: , male    DOB: [birth year], 39 y.o.   MRN: [id number]  Chief Complaint  Patient presents with  . Follow-up    lung mass    Referring provider: No ref. provider found  HPI: 39 year old male, never smoked. PMH significant for left upper lobe lung mass, dyspnea, asthma symtpoms. Patient of Dr. 41, last seen in office on 09/12/19. Patient is spanish speaking. 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 He does not have insurance. Normal pulmonary function testing. Eosinophils 300. Has not had FENO or IgE.   CT chest 07/31/19 that showed a smooth left upper lobe posterior segment subpleural groundglass. PET scan on 08/21/19 showed a superior segment left lower lobe rounded nodule measuring 2.6 x 2.7 cm, SUV max 3.3. Left hilar lymph node measuring 9 mm has minimal metabolic activity SUV max equal 2.3. No central hypermetabolic mediastinal lymph nodes   10/10/2019 Patient presents today for 4 week follow-up. He had hemoptysis at last visit which has been going on for some time. PET scan in June showed persistence of mass suspicious of carcinoid although low uptake. Employer not providing proper PPE, considering changing his job. He needs to be wearing N95 mask or respirator. Suspected respiratory symptoms are actually being driven by suspected underlying carcinoid. Referred to Dr. July. Encouraged patient to get health insurance. Patient had surgical consult on 09/16/19 with Dr. 11/17/19 for lung mass. Plan robotic left VATS for wedge resection possible lobectomy on 10/02/19. Patient elected for biopsy versus surgery. 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   Patient speaks little english, used translator via ipad today. He continues to experience wheezing and hemoptysis in the evening and morning. Hemoptysis has lessened in amount. Breathing was improved on ICS/LABA but he can not afford BREO d/t no medical  insurance. He has an albuterol rescue inhaler which helps his wheezing symptoms. Stopped working but still employed by company. Plan check aspergillus/mold panel and FENO at next visit.   "Aspergillus antibody titers for months or years to detect relapse of active infection. Patients with nodules that develop in the context of allergic bronchopulmonary aspergillosis (ABPA) or severe asthma may derive improvement of their asthma control with antifungal therapy"    Significant Testing: 10/02/19- AFB negative   10/02/19 Surgical pathology A. LUNG LEFT LOWER LOBE, BIOPSY - Aspergilloma  10/02/19 Cytology A. LUNG, LLL, FINE NEEDLE ASPIRATION:  - No malignant cells identified  - Fungal organisms present consistent with Aspergillus  - Acute inflammation   B. LUNG, LLL, BRUSHING:  - No malignant cells identified  - Fungal organisms present consistent with Aspergillus  - Acute inflammation   Allergies  Allergen Reactions  . Lentil Shortness Of Breath and Rash    lentils  . Pea Shortness Of Breath and Rash    Green Peas    Immunization History  Administered Date(s) Administered  . PFIZER SARS-COV-2 Vaccination 07/08/2019, 07/29/2019    History reviewed. No pertinent past medical history.  Tobacco History: Social History   Tobacco Use  Smoking Status Never Smoker  Smokeless Tobacco Never Used   Counseling given: Not Answered   Outpatient Medications Prior to Visit  Medication Sig Dispense Refill  . albuterol (VENTOLIN HFA) 108 (90 Base) MCG/ACT inhaler Inhale 2 puffs into the lungs every 6 (six) hours as needed for wheezing or shortness of breath.    . Guaifenesin (MUCINEX MAXIMUM STRENGTH) 1200 MG TB12 Take 1,200 mg by mouth every 12 (twelve) hours.    07/31/2019  fluticasone furoate-vilanterol (BREO ELLIPTA) 200-25 MCG/INH AEPB Inhale 1 puff into the lungs daily. (Patient not taking: Reported on 10/10/2019)     No facility-administered medications prior to visit.    Review of  Systems  Review of Systems  Constitutional: Negative for fever.  Respiratory: Positive for cough and wheezing.    Physical Exam  BP 120/80 (BP Location: Left Arm, Cuff Size: Normal)   Pulse 93   Temp (!) 97.3 F (36.3 C) (Oral)   Ht 5\' 9"  (1.753 m)   Wt 188 lb 12.8 oz (85.6 kg)   SpO2 97%   BMI 27.88 kg/m  Physical Exam Constitutional:      Appearance: Normal appearance.  HENT:     Head: Normocephalic and atraumatic.     Mouth/Throat:     Mouth: Mucous membranes are moist.     Pharynx: Oropharynx is clear.  Cardiovascular:     Rate and Rhythm: Normal rate and regular rhythm.  Pulmonary:     Effort: Pulmonary effort is normal.     Breath sounds: Normal breath sounds. No wheezing.  Skin:    General: Skin is warm and dry.  Neurological:     General: No focal deficit present.     Mental Status: He is alert and oriented to person, place, and time. Mental status is at baseline.  Psychiatric:        Mood and Affect: Mood normal.        Behavior: Behavior normal.        Thought Content: Thought content normal.        Judgment: Judgment normal.     Lab Results:  CBC    Component Value Date/Time   WBC 7.4 09/30/2019 1215   RBC 4.69 09/30/2019 1215   HGB 13.8 09/30/2019 1215   HCT 42.3 09/30/2019 1215   PLT 205 09/30/2019 1215   MCV 90.2 09/30/2019 1215   MCH 29.4 09/30/2019 1215   MCHC 32.6 09/30/2019 1215   RDW 12.3 09/30/2019 1215   LYMPHSABS 2.3 08/06/2019 1313   MONOABS 0.5 08/06/2019 1313   EOSABS 0.3 08/06/2019 1313   BASOSABS 0.0 08/06/2019 1313    BMET    Component Value Date/Time   NA 138 09/30/2019 1215   K 3.9 09/30/2019 1215   CL 105 09/30/2019 1215   CO2 25 09/30/2019 1215   GLUCOSE 106 (H) 09/30/2019 1215   BUN 11 09/30/2019 1215   CREATININE 0.89 09/30/2019 1215   CALCIUM 9.3 09/30/2019 1215   GFRNONAA >60 09/30/2019 1215   GFRAA >60 09/30/2019 1215    BNP No results found for: BNP  ProBNP No results found for:  PROBNP  Imaging: DG Chest 2 View  Addendum Date: 10/01/2019   ADDENDUM REPORT: 10/01/2019 15:35 Electronically Signed   By: 10/03/2019 M.D.   On: 10/01/2019 15:35   Result Date: 10/01/2019 CLINICAL DATA:  Preop for bronchoscopy. EXAM: CHEST - 2 VIEW COMPARISON:  CT of same day. FINDINGS: The heart size and mediastinal contours are within normal limits. No pneumothorax or pleural effusion is noted. Right lung is clear. Rounded density is seen in the left retrohilar region concerning for mass or neoplasm as described on CT scan. The visualized skeletal structures are unremarkable. IMPRESSION: Rounded density seen in left retrohilar region concerning for mass or neoplasm as described on prior CT scan. Electronically Signed: By: 10/03/2019 M.D. On: 10/01/2019 15:33   CT Super D Chest Wo Contrast  Result Date: 09/30/2019 CLINICAL DATA:  39 year old male with history of pulmonary mass. Follow-up study. EXAM: CT CHEST WITHOUT CONTRAST TECHNIQUE: Multidetector CT imaging of the chest was performed using thin slice collimation for electromagnetic bronchoscopy planning purposes, without intravenous contrast. COMPARISON:  PET-CT 08/21/2019. FINDINGS: Cardiovascular: Heart size is normal. There is no significant pericardial fluid, thickening or pericardial calcification. No atherosclerotic calcifications in the thoracic aorta or the coronary arteries. Mediastinum/Nodes: No pathologically enlarged mediastinal or hilar lymph nodes. Please note that accurate exclusion of hilar adenopathy is limited on noncontrast CT scans. Esophagus is unremarkable in appearance. No axillary lymphadenopathy. Lungs/Pleura: Persistent mass in the superior segment of the left lower lobe (axial image 60 of series 8 and coronal image 95 of series 4) measuring 3.7 x 3.2 x 4.2 cm with some surrounding ground-glass attenuation, septal thickening and regional architectural distortion which likely reflects adjacent postobstructive  changes. No other definite suspicious pulmonary nodules or masses are noted. Scattered areas of cylindrical and varicose bronchiectasis in the lower lobes of the lungs bilaterally. No pleural effusions. Upper Abdomen: Unremarkable. Musculoskeletal: There are no aggressive appearing lytic or blastic lesions noted in the visualized portions of the skeleton. IMPRESSION: 1. Persistent mass in the superior segment of the left lower lobe measuring 3.7 x 3.2 x 4.2 cm. This remains suspicious for neoplasm, although the possibility of an infectious etiology such as a mycetoma also warrants consideration. 2. Bronchiectasis in the lower lobes of the lungs bilaterally. Electronically Signed   By: Trudie Reed M.D.   On: 09/30/2019 11:16   DG C-ARM BRONCHOSCOPY  Result Date: 10/02/2019 C-ARM BRONCHOSCOPY: Fluoroscopy was utilized by the requesting physician.  No radiographic interpretation.     Assessment & Plan:   Lung mass - Patient underwent Navigation bronchoscopy with Dr. [name] on 10/02/19, surgical pathology showed aspergilloma left lower lobe. Cytology had no malignant cells - Checking aspergillus/mold panel (possibility for ABPA); he will likely need oral antifungal for several months - Patient does not have insurance, we have referred him to social worker/ may want to touch base with pharmacist as well to see cost of medication  - Follow-up in 1-2 weeks to continue to work on sorting out his care, recommend checking FENO at next visit when patient brings in vaccination card   > 45 mins spent face to face with patient   Glenford Bayley, NP 10/10/2019

## 2019-10-10 ENCOUNTER — Ambulatory Visit (INDEPENDENT_AMBULATORY_CARE_PROVIDER_SITE_OTHER): Payer: Self-pay | Admitting: Primary Care

## 2019-10-10 ENCOUNTER — Telehealth: Payer: Self-pay | Admitting: Primary Care

## 2019-10-10 ENCOUNTER — Encounter: Payer: Self-pay | Admitting: Primary Care

## 2019-10-10 ENCOUNTER — Other Ambulatory Visit: Payer: Self-pay

## 2019-10-10 VITALS — BP 120/80 | HR 93 | Temp 97.3°F | Ht 69.0 in | Wt 188.8 lb

## 2019-10-10 DIAGNOSIS — Z598 Other problems related to housing and economic circumstances: Secondary | ICD-10-CM

## 2019-10-10 DIAGNOSIS — B449 Aspergillosis, unspecified: Secondary | ICD-10-CM

## 2019-10-10 DIAGNOSIS — R918 Other nonspecific abnormal finding of lung field: Secondary | ICD-10-CM

## 2019-10-10 DIAGNOSIS — Z599 Problem related to housing and economic circumstances, unspecified: Secondary | ICD-10-CM

## 2019-10-10 NOTE — Telephone Encounter (Signed)
We would need dosing information to do test claim.  [facility] has their own pharmacy team and would be familiar with pricing/programs available for uninsured patients.

## 2019-10-10 NOTE — Telephone Encounter (Signed)
He did not want to proceed with resection from my understanding and yes was discussed today. He is very nervous but may be agreeable with a lot of hand holding. He continues to have hemoptysis but has lessened. BREO helped his breathing but can not afford and we do not have samples today. He is using albuterol with improvement in wheezing. He had normal PFTs and would likely be a fine surgical candidate.

## 2019-10-10 NOTE — Telephone Encounter (Signed)
Sounds good, thanks.

## 2019-10-10 NOTE — Telephone Encounter (Signed)
Thanks MR.   Triage- please refer to ID re: aspergilloma.   Pharmacy- can you let me know how much either voricanazole or itraconazole would cost patient to help smooth process for ID? Patient does not have insurance

## 2019-10-10 NOTE — Telephone Encounter (Signed)
Patient of yours whom you sent to Dr. [name] for evaluation for left lower lobe nodule low PET uptake, consideration for surgical resection. 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 I'm checking aspergillus/mold panel  Do you recommend treating with antifungal or sending to infectious disease? I will connect with our pharmacist to see how much medication would cost as he has no insurance. Will also refer to social work for help. I am having him come back in 1-2 weeks to ensure we have straightened some of this out   -[employer]

## 2019-10-10 NOTE — Patient Instructions (Addendum)
Pleasure meeting you today [name]  Pathology from bronchoscopy showed fungal infection called Aspergilloma/Aspergillus. No evidence of lung cancer. We will check labs today and likely treat you will a long course of antifungal medication. We may refer you to infectious disease  Your job or home environment may have contributed to this condition, you may want to consider filing for workers compensation  Please look into getting medical insurance/obamacare  Referral: Social work re: financial difficulties   Follow-up: 1-2 weeks with Dr. [name] (preferred) - [facility] if no availability  _____________________________________________________________________________   Eduardo Moore placer conocerte hoy [name]  La patologa de la broncoscopia mostr una infeccin por hongos llamada Aspergiloma / Aspergillus. No hay evidencia de cncer de pulmn. Revisaremos los laboratorios hoy y probablemente lo trataremos con un tratamiento prolongado de medicamentos antimicticos. Podemos derivarlo a enfermedades infecciosas.  Su Aleen Campi o entorno familiar puede haber contribuido a esta condicin, es posible que desee considerar solicitar una compensacin para trabajadores.  Considere obtener un seguro mdico / obamacare  Remisin: Trabajo social re: paciente no asegurado  Hacer un seguimiento: 1-2 semanas con el Dr. [name] (preferido) - [facility] si no hay disponibilidad

## 2019-10-10 NOTE — Assessment & Plan Note (Addendum)
-   Patient underwent Navigation bronchoscopy with Dr. [name] on 10/02/19, surgical pathology showed aspergilloma left lower lobe. Cytology had no malignant cells - Checking aspergillus/mold panel (possibility for ABPA); he will likely need oral antifungal for several months - Patient does not have insurance, we have referred him to social worker/ may want to touch base with pharmacist as well to see cost of medication  - Follow-up in 1-2 weeks to continue to work on sorting out his care, recommend checking FENO at next visit when patient brings in vaccination card

## 2019-10-10 NOTE — Telephone Encounter (Signed)
Referral to ID made.

## 2019-10-10 NOTE — Telephone Encounter (Signed)
sO patient told me he cancelled all his Rx and moving to [employer]. Then I noticed he had bx instead of reseaction and it showed aspergilloma. Unless I am mistaken the Rx for solitary aspergilloma (ensure HIV neg as well by the way) is surgical removal - this is because aspergillus is invasive and they can have life threatening hemoptysis. This is first line. We reserve azoles for people who cannot tolerate surgery   Plan - DId you talk to him about reseaction?  - LMK  - if he is nervious or something I can curb side ID  . Might even needs a MDD discussion (have listed him for aug 2021 ILD conference but really should be in tumor conference and I can change)  - calll me if you have any quesions

## 2019-10-10 NOTE — Telephone Encounter (Signed)
Thanks a lot. Then he needs ID referreal to Rx with azoles - voricanazole likely or itraconazole.

## 2019-10-13 LAB — INTERPRETATION:

## 2019-10-13 LAB — ALLERGY PANEL 11, MOLD GROUP
Allergen, A. alternata, m6: <0.1 kU/L
Allergen, Mucor Racemosus, M4: <0.1 kU/L
Aspergillus fumigatus, m3: <0.1 kU/L
CLADOSPORIUM HERBARUM (M2) IGE: <0.1 kU/L
CLASS: 0
Candida Albicans: 0.12 kU/L — ABNORMAL HIGH
Class: 0
Class: 0
Class: 0

## 2019-10-13 NOTE — H&P (Signed)
PCP is Patient, No Pcp Per Referring Provider is Kalman Shan, MD      Chief Complaint  Patient presents with  . Lung Lesion    Surgical eval, PET Scan 08/21/19, Chest CT 07/29/19-[facility]   [name] is accompanied by an interpreter Eduardo Moore.  He speaks good [place] but she did interpret medical issues for him.  HPI: [name] was sent for consultation regarding a left lung nodule  Eduardo Moore is a 39 year old Hispanic male with a history of allergies and wheezing.  He is a lifelong non-smoker and works in [employer] .  He started noticing wheezing mostly in the spring a few years ago.  He presented this year with worsening wheezing and pain in his left back with deep inspiration.  He was treated with a Z-Pak and his wheezing improved to some degree.  Along that time he had a chest x-ray which showed a left upper lobe lung mass.  A CT of the chest showed a 3 x 2.3 cm masslike lesion either in the superior segment of the lower lobe or the posterior segment of the upper lobe.  He was seen by Dr. [name].  QuantiFERON gold test was negative.  A PET/CT showed only mild metabolic uptake with an SUV of 3.3.  There is no evidence of regional or distant metastasis.  Differential diagnosis included carcinoid tumor versus infection or some unusual inflammatory mass.  Garden-variety lung cancer very unlikely given his young age and non-smoker.  He saw Dr. [name] again on 09/12/2019.  He was complaining of hemoptysis.  Given that he referred him for surgical resection.  Past medical history Allergies Asthma   No past surgical history on file.       Family History  Problem Relation Age of Onset  . Asthma Mother     Social History Social History        Tobacco Use  . Smoking status: Never Smoker  . Smokeless tobacco: Never Used  Substance Use Topics  . Alcohol use: Yes    Comment: occasional  . Drug use: Never          Current  Outpatient Medications  Medication Sig Dispense Refill  . fluticasone furoate-vilanterol (BREO ELLIPTA) 200-25 MCG/INH AEPB Inhale 1 puff into the lungs daily.    Eduardo Moore Kitchen guaiFENesin (MUCINEX) 600 MG 12 hr tablet Take by mouth 2 (two) times daily.     No current facility-administered medications for this visit.         Allergies  Allergen Reactions  . Other Shortness Of Breath and Rash    Reaction to green peas and lentils    Review of Systems  Constitutional: Positive for unexpected weight change (Has gained 5 pounds in 3 months). Negative for activity change and appetite change.  HENT: Negative for trouble swallowing and voice change.   Eyes: Negative for visual disturbance.  Respiratory: Positive for wheezing. Negative for shortness of breath.        Pleuritic pain with deep breath.  Hemoptysis  Cardiovascular: Negative for chest pain and leg swelling.  Gastrointestinal: Negative for abdominal distention and abdominal pain.  Genitourinary: Negative for difficulty urinating and dysuria.  Neurological: Positive for numbness (In hands, 2 years ago, resolved).  Hematological: Negative for adenopathy. Does not bruise/bleed easily.  Psychiatric/Behavioral: The patient is nervous/anxious.   All other systems reviewed and are negative.   BP 140/87   Pulse 78   Resp 20   Ht 5\' 9"  (1.753 m)  Wt 192 lb (87.1 kg)   SpO2 100% Comment: RA  BMI 28.35 kg/m  Physical Exam Vitals reviewed.  Constitutional:      General: He is not in acute distress.    Appearance: Normal appearance.  HENT:     Head: Normocephalic and atraumatic.  Eyes:     General: No scleral icterus.    Extraocular Movements: Extraocular movements intact.  Cardiovascular:     Rate and Rhythm: Normal rate and regular rhythm.     Pulses: Normal pulses.     Heart sounds: Normal heart sounds. No murmur heard.  No friction rub. No gallop.   Pulmonary:     Effort: Pulmonary effort is normal. No respiratory  distress.     Breath sounds: Normal breath sounds. No wheezing.  Abdominal:     General: There is no distension.     Palpations: Abdomen is soft.     Tenderness: There is no abdominal tenderness.  Musculoskeletal:        General: No swelling.     Cervical back: Neck supple. No rigidity.  Lymphadenopathy:     Cervical: No cervical adenopathy.  Skin:    General: Skin is warm and dry.  Neurological:     General: No focal deficit present.     Mental Status: He is alert and oriented to person, place, and time.     Cranial Nerves: No cranial nerve deficit.     Motor: No weakness.     Gait: Gait normal.    Diagnostic Tests: NUCLEAR MEDICINE PET SKULL BASE TO THIGH  TECHNIQUE: 9.6 mCi F-18 FDG was injected intravenously. Full-ring PET imaging was performed from the skull base to thigh after the radiotracer. CT data was obtained and used for attenuation correction and anatomic localization.  Fasting blood glucose: 96 mg/dl  COMPARISON: None.  FINDINGS: Mediastinal blood pool activity: SUV max 2.7  Liver activity: SUV max NA  NECK: No hypermetabolic lymph nodes in the neck.  Incidental CT findings: none  CHEST: The superior segment of the LEFT lower lobe rounded nodule measuring 2.6 by 2.7 cm. Nodule has mild peripheral metabolic activity SUV max equal 3.3. Centrally the lesion is near fluid density. No comparison exam.  Prominent LEFT hilar lymph node measuring 9 mm has minimal metabolic activity SUV max equal 2.3.  No central hypermetabolic mediastinal lymph nodes. No additional pulmonary nodules.  Incidental CT findings: none  ABDOMEN/PELVIS: No abnormal hypermetabolic activity within the liver, pancreas, adrenal glands, or spleen. No hypermetabolic lymph nodes in the abdomen or pelvis.  Incidental CT findings: none  SKELETON: No focal hypermetabolic activity to suggest skeletal metastasis.  Incidental CT findings: none  IMPRESSION: 1. Low  peripheral metabolic activity associated with the LEFT lower lobe rounded pulmonary nodule. The relatively low metabolic activity for size and central fluid density favor benign infectious process. Cannot exclude a low metabolically active lesion such is mucinous carcinoma. Recommend follow-up CT of the thorax with contrast in 1-3 months to demonstrate reduction in volume. 2. Prominent LEFT hilar lymph node is likely reactive. 3. No distant metastatic disease. 4. No evidence of primary malignancy elsewhere on the whole-body scan.   Electronically Signed By: Genevive Bi M.D. On: 08/21/2019 16:34  I personally reviewed the PET/CT images and concur with the findings noted above  Pulmonary function testing FVC 4.70 (93%) FEV1 3.60 (89%) TLC 6.10 (92%) DLCO 34.70 (117%)   Impression: Eduardo Moore is a 39 year old previously healthy man who presented with left-sided pleuritic chest pain, wheezing,  and hemoptysis.  A chest x-ray showed a left lung nodule.  A CT done at an outside facility showed a 3.0 x 2.3 cm round nodule in the superior segment of the lower lobe (versus posterior upper lobe).  On PET CT there was mild hypermetabolic activity with SUV of 3.3.  There was no regional or distant metastatic disease.  Differential diagnosis includes primary bronchogenic carcinoma, carcinoid tumor, as well as infectious and inflammatory nodules.  Of these either carcinoid tumor or infection are the most likely.  In either event I think given the size of the nodule the best option would be to go ahead and remove it.  This would be amenable to bronchoscopic biopsy, but that may be nondiagnostic and given the ongoing hemoptysis the nodule probably needs to be removed regardless.  I described the proposed operation of robotic left VATS for wedge resection and possible lobectomy with [name].  I informed him of the general nature of the procedure including the need for  general anesthesia, the incisions to be used, use of a drainage tube postoperatively, the expected hospital stay, and the overall recovery.  I informed him of the indications, risks, benefits, and alternatives.  He understands the risks include, but not limited to death, MI, DVT, PE, bleeding, possible need for transfusion, infection, air leak, irregular heart rhythms, as well as possibility of other unforeseeable complications.  He accepts the risks and agrees to proceed.  Plan: Robotic left VATS for wedge resection possible lobectomy on Thursday 10/02/2019.  I spent 45 minutes in review of images, records, and consultation with [name] today. Loreli Slot, MD [facility] [phone]

## 2019-10-14 ENCOUNTER — Other Ambulatory Visit: Payer: Self-pay

## 2019-10-14 ENCOUNTER — Ambulatory Visit (INDEPENDENT_AMBULATORY_CARE_PROVIDER_SITE_OTHER): Payer: Self-pay | Admitting: Thoracic Surgery (Cardiothoracic Vascular Surgery)

## 2019-10-14 VITALS — BP 145/78 | HR 79 | Resp 20 | Ht 69.0 in | Wt 192.0 lb

## 2019-10-14 DIAGNOSIS — Z9889 Other specified postprocedural states: Secondary | ICD-10-CM

## 2019-10-14 DIAGNOSIS — R911 Solitary pulmonary nodule: Secondary | ICD-10-CM

## 2019-10-14 NOTE — Progress Notes (Signed)
[address]       [name] [address]             [phone]      HPI: [name] returns to discuss the results of his biopsies.  He is accompanied by an interpreter.  Eduardo [place] is a 39 year old Hispanic male with a history of allergies and wheezing.  He is a lifelong smoker.  He does work in [employer] .  He developed worsening wheezing in the spring.  He then developed hemoptysis.  He eventually was found to have a left lung mass.  This initially was read as being left upper lobe but it actually is in the superior segment of the left lower lobe.  He saw Dr. [name].  QuantiFERON gold was negative.  A PET/CT showed mild uptake with an SUV of 3.3.  We had scheduled him for robotic surgical resection but he changed his mind and did not want to proceed with surgery.  He did agree to have a bronchoscopic biopsy.  I did that on 10/02/2019.  He did well and went home on the same day.  Biopsy showed aspergilloma.  He continues to have some mild hemoptysis.  Only small amounts of blood noted in his sputum.  His wife that did give birth to a baby girl this morning at 3 AM.   Current Outpatient Medications  Medication Sig Dispense Refill  . albuterol (VENTOLIN HFA) 108 (90 Base) MCG/ACT inhaler Inhale 2 puffs into the lungs every 6 (six) hours as needed for wheezing or shortness of breath.    . fluticasone furoate-vilanterol (BREO ELLIPTA) 200-25 MCG/INH AEPB Inhale 1 puff into the lungs daily.  (Patient not taking: Reported on 10/14/2019)     No current facility-administered medications for this visit.    Physical Exam BP (!) 145/78   Pulse 79   Resp 20   Ht 5\' 9"  (1.753 m)   Wt 192 lb (87.1 kg)   SpO2 98% Comment: RA  BMI 28.35 kg/m  Well-appearing 39 year old man in no acute distress Lungs clear bilaterally  Diagnostic Tests: FINAL MICROSCOPIC DIAGNOSIS:   A. LUNG, LEFT LOWER LOBE, BIOPSY:  - Aspergilloma, see comment    B. LUNG, LEFT LOWER LOBE, BIOPSY:  - Aspergilloma, see comment    Impression: Eduardo [place] is a 39 year old man who presented with wheezing and hemoptysis.  He was found to have a mass in the superior segment of the left lower lobe.  That was mildly hypermetabolic on PET/CT.  He canceled surgery but did agree to bronchoscopic biopsy.  He did that because he was considering a move to [place].  That biopsy showed aspergilloma.  His wife gave birth to a baby girl this morning.  He has decided not to move to [employer].  I discussed the finding of aspergilloma with him.  He understands this is not cancerous, but is a serious infection that could potentially be life-threatening.  Typically in the setting of hemoptysis surgical resection is the best treatment.  In his case it would require lobectomy because in addition to the active lesion in the superior segment there are multiple other cavitary lesions in the lower lobe.  He is reluctant to have surgery.  He has an appointment with Dr. [employer] with infectious disease in 2 weeks.  He wants to talk to him before he makes any decisions.  I am fine with that but will schedule him to come back and see  me after that.  Plan: Consultation with Dr. Orvan Falconer of infectious disease Return in 3 weeks.  10 minutes in review of images, records, and in consultation with [name] today. Loreli Slot, MD [facility] [phone]

## 2019-10-14 NOTE — Progress Notes (Signed)
Mold panel look fine, candida (yeast) slightly elevated which is insignificant. This is usually related to normal gut flora.

## 2019-10-14 NOTE — Telephone Encounter (Signed)
Spoke with pt and informed that ID referral was placed and Pharmacy will reach out regarding medications.  Routing to pharmacy as [facility]

## 2019-10-14 NOTE — Telephone Encounter (Signed)
Patient is returning phone call. Patient phone number is [phone].

## 2019-10-16 ENCOUNTER — Other Ambulatory Visit: Payer: Self-pay

## 2019-10-16 ENCOUNTER — Emergency Department (HOSPITAL_COMMUNITY)
Admission: EM | Admit: 2019-10-16 | Discharge: 2019-10-17 | Disposition: A | Discharge status: Home / Self Care | Payer: Self-pay | Attending: Emergency Medicine | Admitting: Emergency Medicine

## 2019-10-16 ENCOUNTER — Encounter (HOSPITAL_COMMUNITY): Payer: Self-pay | Admitting: Pediatrics

## 2019-10-16 ENCOUNTER — Telehealth: Payer: Self-pay | Admitting: Primary Care

## 2019-10-16 DIAGNOSIS — R197 Diarrhea, unspecified: Secondary | ICD-10-CM | POA: Insufficient documentation

## 2019-10-16 DIAGNOSIS — R109 Unspecified abdominal pain: Secondary | ICD-10-CM | POA: Insufficient documentation

## 2019-10-16 DIAGNOSIS — R531 Weakness: Secondary | ICD-10-CM | POA: Insufficient documentation

## 2019-10-16 LAB — COMPREHENSIVE METABOLIC PANEL
ALT: 60 U/L — ABNORMAL HIGH (ref 0–44)
AST: 23 U/L (ref 15–41)
Albumin: 4.1 g/dL (ref 3.5–5.0)
Alkaline Phosphatase: 75 U/L (ref 38–126)
Anion gap: 8 (ref 5–15)
BUN: 8 mg/dL (ref 6–20)
CO2: 27 mmol/L (ref 22–32)
Calcium: 9.6 mg/dL (ref 8.9–10.3)
Chloride: 104 mmol/L (ref 98–111)
Creatinine, Ser: 0.94 mg/dL (ref 0.61–1.24)
GFR calc Af Amer: >60 mL/min (ref >60)
GFR calc non Af Amer: >60 mL/min (ref >60)
Glucose, Bld: 162 mg/dL — ABNORMAL HIGH (ref 70–99)
Potassium: 3.7 mmol/L (ref 3.5–5.1)
Sodium: 139 mmol/L (ref 135–145)
Total Bilirubin: 1 mg/dL (ref 0.3–1.2)
Total Protein: 7 g/dL (ref 6.5–8.1)

## 2019-10-16 LAB — CBC
HCT: 43.1 % (ref 39.0–52.0)
Hemoglobin: 14 g/dL (ref 13.0–17.0)
MCH: 29 pg (ref 26.0–34.0)
MCHC: 32.5 g/dL (ref 30.0–36.0)
MCV: 89.4 fL (ref 80.0–100.0)
Platelets: 197 10*3/uL (ref 150–400)
RBC: 4.82 MIL/uL (ref 4.22–5.81)
RDW: 11.8 % (ref 11.5–15.5)
WBC: 8.7 10*3/uL (ref 4.0–10.5)
nRBC: 0 % (ref 0.0–0.2)

## 2019-10-16 LAB — URINALYSIS, ROUTINE W REFLEX MICROSCOPIC
Bilirubin Urine: NEGATIVE
Glucose, UA: NEGATIVE mg/dL
Hgb urine dipstick: NEGATIVE
Ketones, ur: NEGATIVE mg/dL
Leukocytes,Ua: NEGATIVE
Nitrite: NEGATIVE
Protein, ur: NEGATIVE mg/dL
Specific Gravity, Urine: 1.002 — ABNORMAL LOW (ref 1.005–1.030)
pH: 8 (ref 5.0–8.0)

## 2019-10-16 LAB — LIPASE, BLOOD: Lipase: 35 U/L (ref 11–51)

## 2019-10-16 MED ORDER — SODIUM CHLORIDE 0.9% FLUSH: 3.0000 mL | Freq: Once | INTRAVENOUS | Status: DC

## 2019-10-16 MED ORDER — BREO ELLIPTA 200-25 MCG/INH IN AEPB: 1.0000 | INHALATION_SPRAY | Freq: Every day | RESPIRATORY_TRACT | 11 refills | Status: DC

## 2019-10-16 NOTE — Telephone Encounter (Signed)
Printed off [employer] patient assistance forms and a prescription for Breo and placed in Eduardo [place]'s look-at folder.  Will contact patient to pick up forms after they are signed.

## 2019-10-16 NOTE — Telephone Encounter (Signed)
For Breo, since patient is uninsured he would need to apply for patient assistance. No out of pocket spending is required for uninsured patients.

## 2019-10-16 NOTE — Telephone Encounter (Signed)
Patient contacted and patient reports being dizzy, nausea, weak, diarrhea, frequent urination and pressure. Patient directed to the ED as he does not have insurance and feels very sick. Patient will go the ED after call completed.

## 2019-10-16 NOTE — Telephone Encounter (Signed)
Please help patient apply for patient assistance for [facility]

## 2019-10-16 NOTE — ED Triage Notes (Signed)
C/o diarrhea, weakness and increased frequency in urination along w/ abdominal pain and nausea in the morning.

## 2019-10-16 NOTE — ED Triage Notes (Signed)
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

## 2019-10-17 ENCOUNTER — Emergency Department (HOSPITAL_COMMUNITY): Payer: Self-pay

## 2019-10-17 MED ORDER — IOHEXOL 300 MG/ML  SOLN
100.0000 mL | Freq: Once | INTRAMUSCULAR | Status: AC | PRN
  Administered 2019-10-17: 100 mL via INTRAVENOUS

## 2019-10-18 LAB — ASPERGILLUS IGE PANEL
A. Amstel/Glaucu Class Interp: 0
A. Flavus Class Interp: 0
A. Fumigatus Class Interp: 0
A. Nidulans Class Interp: 0
A. Niger Class Interp: 0
A. Versicolor Class Interp: 0
Aspergillus amstel/glaucu IgE*: <0.35 kU/L (ref <0.35)
Aspergillus flavus IgE: <0.35 kU/L (ref <0.35)
Aspergillus fumigatus IgE: <0.1 kU/L (ref <0.35)
Aspergillus nidulans IgE: <0.35 kU/L (ref <0.35)
Aspergillus niger IgE: <0.1 kU/L (ref <0.35)
Aspergillus versicolor IgE: <0.1 kU/L (ref <0.35)

## 2019-10-18 NOTE — ED Provider Notes (Signed)
[facility] EMERGENCY DEPARTMENT Provider Note   CSN: [id number] Arrival date & time: 10/16/19  1256     History Chief Complaint  Patient presents with  . Diarrhea  . Weakness  . Abdominal Pain    Eduardo [place] is a 39 y.o. male.   Diarrhea Quality:  Malodorous and semi-solid Severity:  Mild Onset quality:  Gradual Duration:  2 days Timing:  Intermittent Progression:  Unchanged Relieved by:  Nothing Worsened by:  Nothing Ineffective treatments:  None tried Associated symptoms: abdominal pain   Weakness Associated symptoms: abdominal pain and diarrhea   Abdominal Pain Associated symptoms: diarrhea        History reviewed. No pertinent past medical history.  Patient Active Problem List   Diagnosis Date Noted  . Lung mass 08/25/2019    Past Surgical History:  Procedure Laterality Date  . VIDEO BRONCHOSCOPY WITH ENDOBRONCHIAL NAVIGATION N/A 10/02/2019   Procedure: VIDEO BRONCHOSCOPY WITH ENDOBRONCHIAL NAVIGATION;  Surgeon: Loreli Slot, MD;  Location: [facility] OR;  Service: Thoracic;  Laterality: N/A;       Family History  Problem Relation Age of Onset  . Asthma Mother     Social History   Tobacco Use  . Smoking status: Never Smoker  . Smokeless tobacco: Never Used  Vaping Use  . Vaping Use: Never used  Substance Use Topics  . Alcohol use: Yes    Comment: occasional  . Drug use: Never    Home Medications Prior to Admission medications   Medication Sig Start Date End Date Taking? Authorizing Provider  albuterol (VENTOLIN HFA) 108 (90 Base) MCG/ACT inhaler Inhale 2 puffs into the lungs every 6 (six) hours as needed for wheezing or shortness of breath. 06/23/19  Yes [provider]  fluticasone furoate-vilanterol (BREO ELLIPTA) 200-25 MCG/INH AEPB Inhale 1 puff into the lungs daily. 10/16/19  Yes Glenford Bayley, NP    Allergies    Lentil and Pea  Review of Systems   Review of Systems  Gastrointestinal:  Positive for abdominal pain and diarrhea.  Neurological: Positive for weakness.  All other systems reviewed and are negative.   Physical Exam Updated Vital Signs BP 130/77 (BP Location: Left Arm)   Pulse 65   Temp 98.7 F (37.1 C) (Oral)   Resp 18   SpO2 100%   Physical Exam Vitals and nursing note reviewed.  Constitutional:      Appearance: He is well-developed.  HENT:     Head: Normocephalic and atraumatic.     Nose: No congestion or rhinorrhea.     Mouth/Throat:     Mouth: Mucous membranes are moist.     Pharynx: Oropharynx is clear.  Eyes:     Pupils: Pupils are equal, round, and reactive to light.  Cardiovascular:     Rate and Rhythm: Normal rate.  Pulmonary:     Effort: Pulmonary effort is normal. No respiratory distress.  Abdominal:     General: There is no distension.  Musculoskeletal:        General: Normal range of motion.     Cervical back: Normal range of motion.  Skin:    General: Skin is warm and dry.  Neurological:     General: No focal deficit present.     Mental Status: He is alert.     ED Results / Procedures / Treatments   Labs (all labs ordered are listed, but only abnormal results are displayed) Labs Reviewed  COMPREHENSIVE METABOLIC PANEL - Abnormal; Notable for the  following components:      Result Value   Glucose, Bld 162 (*)    ALT 60 (*)    All other components within normal limits  URINALYSIS, ROUTINE W REFLEX MICROSCOPIC - Abnormal; Notable for the following components:   Color, Urine COLORLESS (*)    Specific Gravity, Urine 1.002 (*)    All other components within normal limits  LIPASE, BLOOD  CBC    EKG None  Radiology CT ABDOMEN PELVIS W CONTRAST  Result Date: 10/17/2019 CLINICAL DATA:  Abdominal abscess or infection suspected. Diarrhea and weakness. EXAM: CT ABDOMEN AND PELVIS WITH CONTRAST TECHNIQUE: Multidetector CT imaging of the abdomen and pelvis was performed using the standard protocol following bolus  administration of intravenous contrast. CONTRAST:  OMNIPAQUE IOHEXOL 300 MG/ML  SOLN COMPARISON:  PET-CT 08/21/2019 FINDINGS: Lower chest:  Cystic bronchiectasis in the left lower lobe Hepatobiliary: No focal liver abnormality.No evidence of biliary obstruction or stone. Pancreas: Unremarkable. Spleen: Unremarkable. Adrenals/Urinary Tract: Negative adrenals. No hydronephrosis or stone. Unremarkable bladder. Stomach/Bowel:  No obstruction. No appendicitis. Vascular/Lymphatic: No acute vascular abnormality. No mass or adenopathy. Reproductive:No pathologic findings. Other: No ascites or pneumoperitoneum. Musculoskeletal: No acute abnormalities. IMPRESSION: No explanation for symptoms.  No appendicitis or obstruction. Electronically Signed   By: Marnee Spring M.D.   On: 10/17/2019 07:47    Procedures Procedures (including critical care time)  Medications Ordered in ED Medications  iohexol (OMNIPAQUE) 300 MG/ML solution 100 mL (100 mLs Intravenous Contrast Given 10/17/19 0726)    ED Course  I have reviewed the triage vital signs and the nursing notes.  Pertinent labs & imaging results that were available during my care of the patient were reviewed by me and considered in my medical decision making (see chart for details).    MDM Rules/Calculators/A&P                          Ct unremarkable for GI aspergilloma. Tolerating PO. No obvious pain. Stable for dc w/ pcp follow up.  Final Clinical Impression(s) / ED Diagnoses Final diagnoses:  Diarrhea, unspecified type  Abdominal pain, unspecified abdominal location    Rx / DC Orders ED Discharge Orders    None       Iman Orourke, Barbara Cower, MD 10/18/19 [phone]

## 2019-10-22 ENCOUNTER — Ambulatory Visit (INDEPENDENT_AMBULATORY_CARE_PROVIDER_SITE_OTHER): Payer: Self-pay | Admitting: Primary Care

## 2019-10-22 ENCOUNTER — Encounter: Payer: Self-pay | Admitting: Primary Care

## 2019-10-22 ENCOUNTER — Other Ambulatory Visit: Payer: Self-pay

## 2019-10-22 DIAGNOSIS — B449 Aspergillosis, unspecified: Secondary | ICD-10-CM

## 2019-10-22 DIAGNOSIS — Z8619 Personal history of other infectious and parasitic diseases: Secondary | ICD-10-CM | POA: Insufficient documentation

## 2019-10-22 DIAGNOSIS — R918 Other nonspecific abnormal finding of lung field: Secondary | ICD-10-CM

## 2019-10-22 MED ORDER — ONDANSETRON HCL 4 MG PO TABS: 4.0000 mg | ORAL_TABLET | Freq: Three times a day (TID) | ORAL | 1 refills | Status: DC | PRN

## 2019-10-22 MED ORDER — OMEPRAZOLE 20 MG PO CPDR
20.0000 mg | DELAYED_RELEASE_CAPSULE | Freq: Two times a day (BID) | ORAL | 1 refills | Status: DC
Start: 2019-10-22 — End: 2019-10-23

## 2019-10-22 NOTE — Assessment & Plan Note (Deleted)
-  Patient underwent navigation bronchoscopy with Dr. Hendrickson on 10/02/2019, surgical pathology showed aspergilloma left lower lobe.  Cytology had no malignant cells.  He continues to have sporadic hemoptysis with new symptoms of nausea, diarrhea and night sweats.  Symptoms started 6 months ago after inhaling a piece of insulation at work.  Breathing improved with Breo, patient assistance paperwork completed today.  We will give him a prescription for omeprazole 20 mg twice daily and as needed Zofran 4 mg every 8 hours for nausea.  Encourage patient push oral fluids. Surgical resection remains best treatment approach, however, patient is reluctant to proceed with surgery.  He will follow up with infectious disease in 1 week and make final decision after that visit.  He has a follow-up with Dr. Hendrickson end of August. 

## 2019-10-22 NOTE — Progress Notes (Addendum)
@Patient  ID: , male    DOB: 29-Feb-1981, 39 y.o.   MRN: [id number]  Chief Complaint  Patient presents with  . Follow-up    Referring provider: No ref. provider found  HPI: 39 year old male, never smoked. PMH significant for left upper lobe lung mass, dyspnea, asthma symtpoms. Patient of Dr. 41, last seen in office on 09/12/19. Patient is spanish speaking. 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 He does not have insurance. Normal pulmonary function testing. Eosinophils 300. Has not had FENO or IgE.   CT chest 07/31/19 that showed a smooth left upper lobe posterior segment subpleural groundglass. PET scan on 08/21/19 showed a superior segment left lower lobe rounded nodule measuring 2.6 x 2.7 cm, SUV max 3.3. Left hilar lymph node measuring 9 mm has minimal metabolic activity SUV max equal 2.3. No central hypermetabolic mediastinal lymph nodes  Previous [facility] pulmonary encounters: 10/10/2019 Patient presents today for 4 week follow-up. He had hemoptysis at last visit which has been going on for some time. PET scan in June showed persistence of mass suspicious of carcinoid although low uptake. Employer not providing proper PPE, considering changing his job. He needs to be wearing N95 mask or respirator. Suspected respiratory symptoms are actually being driven by suspected underlying carcinoid. Referred to Dr. July. Encouraged patient to get health insurance. Patient had surgical consult on 09/16/19 with Dr. 11/17/19 for lung mass. Plan robotic left VATS for wedge resection possible lobectomy on 10/02/19. Patient elected for biopsy versus surgery. 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   Patient speaks little english, used translator via ipad today. He continues to experience wheezing and hemoptysis in the evening and morning. Hemoptysis has lessened in amount. Breathing was improved on ICS/LABA but he can not afford  BREO d/t no medical insurance. He has an albuterol rescue inhaler which helps his wheezing symptoms. Stopped working but still employed by company. Plan check aspergillus/mold panel and FENO at next visit.   "Aspergillus antibody titers for months or years to detect relapse of active infection. Patients with nodules that develop in the context of allergic bronchopulmonary aspergillosis (ABPA) or severe asthma may derive improvement of their asthma control with antifungal therapy"   10/22/2019- Interim hx Patient presents today for 1 month follow-up. During last visit referred to ID. He saw Dr. 12/22/2019 on 10/14/2019.  Findings were again discussed with patient significant for Aspergilloma.  He understands that this is not cancerous but a serious infection that could potentially be life-threatening.  Typically insetting hemoptysis surgical resection is best treatment.  Patient is reluctant to have surgery.  He has an appointment with Dr. 12/14/2019 with ID in 2 weeks and would like to talk with him before making any decisions.  Patient will return to see Dr. Orvan Falconer end of August.  He tells me his symptoms started 6 months ago after a large piece of insultation fell while he was working at [employer] and got into his mask. He was unable to get it out and inhaled material into his lungs. Hemoptysis started after this event at work. Cough is mostly brown. He did have several episodes of hemoptysis yesterday. He also reports diarrhea, nasuea and night sweats x 1 week. Nausea in more prominent in the morning. He was seen in ED twice. WBC 10.2, BUN 7. His appointment with ID is scheduled for August 17th     Significant Testing: 10/02/19- AFB negative   10/02/19 Surgical pathology A. LUNG LEFT LOWER  LOBE, BIOPSY - Aspergilloma  10/02/19 Cytology A. LUNG, LLL, FINE NEEDLE ASPIRATION:  - No malignant cells identified  - Fungal organisms present consistent with Aspergillus  - Acute inflammation   B.  LUNG, LLL, BRUSHING:  - No malignant cells identified  - Fungal organisms present consistent with Aspergillus  - Acute inflammation   Allergies  Allergen Reactions  . Lentil Shortness Of Breath and Rash    lentils  . Pea Shortness Of Breath and Rash    Green Peas    Immunization History  Administered Date(s) Administered  . PFIZER SARS-COV-2 Vaccination 07/08/2019, 07/29/2019    History reviewed. No pertinent past medical history.  Tobacco History: Social History   Tobacco Use  Smoking Status Never Smoker  Smokeless Tobacco Never Used   Counseling given: Not Answered   Outpatient Medications Prior to Visit  Medication Sig Dispense Refill  . albuterol (VENTOLIN HFA) 108 (90 Base) MCG/ACT inhaler Inhale 2 puffs into the lungs every 6 (six) hours as needed for wheezing or shortness of breath.    . fluticasone furoate-vilanterol (BREO ELLIPTA) 200-25 MCG/INH AEPB Inhale 1 puff into the lungs daily. 60 each 11   No facility-administered medications prior to visit.   Review of Systems  Review of Systems  Constitutional: Negative.   HENT: Negative.   Respiratory: Positive for cough and shortness of breath.   Cardiovascular: Negative.   Gastrointestinal: Positive for diarrhea and nausea.   Physical Exam  BP 132/78 (BP Location: Left Arm, Cuff Size: Normal)   Pulse 86   Temp (!) 97.3 F (36.3 C) (Temporal)   Ht 5\' 9"  (1.753 m)   Wt 190 lb 12.8 oz (86.5 kg)   SpO2 97% Comment: RA  BMI 28.18 kg/m  Physical Exam Constitutional:      Appearance: Normal appearance.  HENT:     Head: Normocephalic and atraumatic.  Cardiovascular:     Rate and Rhythm: Normal rate and regular rhythm.  Pulmonary:     Effort: Pulmonary effort is normal.     Breath sounds: Normal breath sounds.  Neurological:     General: No focal deficit present.     Mental Status: He is alert and oriented to person, place, and time. Mental status is at baseline.  Psychiatric:        Mood and  Affect: Mood normal.        Behavior: Behavior normal.        Thought Content: Thought content normal.        Judgment: Judgment normal.      Lab Results:  CBC    Component Value Date/Time   WBC 8.7 10/16/2019 1325   RBC 4.82 10/16/2019 1325   HGB 14.0 10/16/2019 1325   HCT 43.1 10/16/2019 1325   PLT 197 10/16/2019 1325   MCV 89.4 10/16/2019 1325   MCH 29.0 10/16/2019 1325   MCHC 32.5 10/16/2019 1325   RDW 11.8 10/16/2019 1325   LYMPHSABS 2.3 08/06/2019 1313   MONOABS 0.5 08/06/2019 1313   EOSABS 0.3 08/06/2019 1313   BASOSABS 0.0 08/06/2019 1313    BMET    Component Value Date/Time   NA 139 10/16/2019 1325   K 3.7 10/16/2019 1325   CL 104 10/16/2019 1325   CO2 27 10/16/2019 1325   GLUCOSE 162 (H) 10/16/2019 1325   BUN 8 10/16/2019 1325   CREATININE 0.94 10/16/2019 1325   CALCIUM 9.6 10/16/2019 1325   GFRNONAA >60 10/16/2019 1325   GFRAA >60 10/16/2019 1325  BNP No results found for: BNP  ProBNP No results found for: PROBNP  Imaging: DG Chest 2 View  Addendum Date: 10/01/2019   ADDENDUM REPORT: 10/01/2019 15:35 Electronically Signed   By: Lupita RaiderJames  Green Jr M.D.   On: 10/01/2019 15:35   Result Date: 10/01/2019 CLINICAL DATA:  Preop for bronchoscopy. EXAM: CHEST - 2 VIEW COMPARISON:  CT of same day. FINDINGS: The heart size and mediastinal contours are within normal limits. No pneumothorax or pleural effusion is noted. Right lung is clear. Rounded density is seen in the left retrohilar region concerning for mass or neoplasm as described on CT scan. The visualized skeletal structures are unremarkable. IMPRESSION: Rounded density seen in left retrohilar region concerning for mass or neoplasm as described on prior CT scan. Electronically Signed: By: Lupita RaiderJames  Green Jr M.D. On: 10/01/2019 15:33   CT ABDOMEN PELVIS W CONTRAST  Result Date: 10/17/2019 CLINICAL DATA:  Abdominal abscess or infection suspected. Diarrhea and weakness. EXAM: CT ABDOMEN AND PELVIS WITH CONTRAST  TECHNIQUE: Multidetector CT imaging of the abdomen and pelvis was performed using the standard protocol following bolus administration of intravenous contrast. CONTRAST:  100mL OMNIPAQUE IOHEXOL 300 MG/ML  SOLN COMPARISON:  PET-CT 08/21/2019 FINDINGS: Lower chest:  Cystic bronchiectasis in the left lower lobe Hepatobiliary: No focal liver abnormality.No evidence of biliary obstruction or stone. Pancreas: Unremarkable. Spleen: Unremarkable. Adrenals/Urinary Tract: Negative adrenals. No hydronephrosis or stone. Unremarkable bladder. Stomach/Bowel:  No obstruction. No appendicitis. Vascular/Lymphatic: No acute vascular abnormality. No mass or adenopathy. Reproductive:No pathologic findings. Other: No ascites or pneumoperitoneum. Musculoskeletal: No acute abnormalities. IMPRESSION: No explanation for symptoms.  No appendicitis or obstruction. Electronically Signed   By: Marnee SpringJonathon  Watts M.D.   On: 10/17/2019 07:47   CT Super D Chest Wo Contrast  Result Date: 09/30/2019 CLINICAL DATA:  39 year old male with history of pulmonary mass. Follow-up study. EXAM: CT CHEST WITHOUT CONTRAST TECHNIQUE: Multidetector CT imaging of the chest was performed using thin slice collimation for electromagnetic bronchoscopy planning purposes, without intravenous contrast. COMPARISON:  PET-CT 08/21/2019. FINDINGS: Cardiovascular: Heart size is normal. There is no significant pericardial fluid, thickening or pericardial calcification. No atherosclerotic calcifications in the thoracic aorta or the coronary arteries. Mediastinum/Nodes: No pathologically enlarged mediastinal or hilar lymph nodes. Please note that accurate exclusion of hilar adenopathy is limited on noncontrast CT scans. Esophagus is unremarkable in appearance. No axillary lymphadenopathy. Lungs/Pleura: Persistent mass in the superior segment of the left lower lobe (axial image 60 of series 8 and coronal image 95 of series 4) measuring 3.7 x 3.2 x 4.2 cm with some surrounding  ground-glass attenuation, septal thickening and regional architectural distortion which likely reflects adjacent postobstructive changes. No other definite suspicious pulmonary nodules or masses are noted. Scattered areas of cylindrical and varicose bronchiectasis in the lower lobes of the lungs bilaterally. No pleural effusions. Upper Abdomen: Unremarkable. Musculoskeletal: There are no aggressive appearing lytic or blastic lesions noted in the visualized portions of the skeleton. IMPRESSION: 1. Persistent mass in the superior segment of the left lower lobe measuring 3.7 x 3.2 x 4.2 cm. This remains suspicious for neoplasm, although the possibility of an infectious etiology such as a mycetoma also warrants consideration. 2. Bronchiectasis in the lower lobes of the lungs bilaterally. Electronically Signed   By: Trudie Reedaniel  Entrikin M.D.   On: 09/30/2019 11:16   DG C-ARM BRONCHOSCOPY  Result Date: 10/02/2019 C-ARM BRONCHOSCOPY: Fluoroscopy was utilized by the requesting physician.  No radiographic interpretation.     Assessment & Plan:   Aspergilloma (  [facility]) -Patient underwent navigation bronchoscopy with Dr. [name] on 10/02/2019, surgical pathology showed aspergilloma left lower lobe.  Cytology had no malignant cells.  He continues to have sporadic hemoptysis with new symptoms of nausea, diarrhea and night sweats.  Symptoms started 6 months ago after inhaling a piece of insulation at work.  Breathing improved with Breo, patient assistance paperwork completed today.  We will give him a prescription for omeprazole 20 mg twice daily and as needed Zofran 4 mg every 8 hours for nausea.  Encourage patient push oral fluids. Surgical resection remains best treatment approach, however, patient is reluctant to proceed with surgery.  He will follow up with infectious disease in 1 week and make final decision after that visit.  He has a follow-up with Dr. [name] end of August.  Glenford Bayley,  NP 10/22/2019

## 2019-10-22 NOTE — Assessment & Plan Note (Signed)
-  Patient underwent navigation bronchoscopy with Dr. [name] on 10/02/2019, surgical pathology showed aspergilloma left lower lobe.  Cytology had no malignant cells.  He continues to have sporadic hemoptysis with new symptoms of nausea, diarrhea and night sweats.  Symptoms started 6 months ago after inhaling a piece of insulation at work.  Breathing improved with Breo, patient assistance paperwork completed today.  We will give him a prescription for omeprazole 20 mg twice daily and as needed Zofran 4 mg every 8 hours for nausea.  Encourage patient push oral fluids. Surgical resection remains best treatment approach, however, patient is reluctant to proceed with surgery.  He will follow up with infectious disease in 1 week and make final decision after that visit.  He has a follow-up with Dr. [name] end of August.

## 2019-10-22 NOTE — Patient Instructions (Addendum)
Pleasure seeing you today Eduardo Moore  Recommendations: - Encourage oral fluids EIGHT to TEN glasses of water (8oz each) - Take pedialyte three times a day  - Follow-up with Dr. Orvan Falconer (infectious disease) and Dr. [name] as scheduled  Rx:  - Omperazole 20mg  twice daily before meals - Zofran 4mg  tablet every 8 hours AS NEEDED for nausea    Follow-up: - 2-3 months with Dr.   _____________________________________________________________________________  Es un placer verte hoy Eduardo Moore  Recomendaciones: - Fomente el consumo de lquidos por va oral OCHO a DIEZ vasos de agua (8 oz cada uno) - Tome pedialyte tres veces al . - Seguimiento con el Dr. [name] (enfermedad infecciosa) y el Dr. Futures trader segn lo programado  Rx: - Omperazol 20 mg Orvan Falconer al da antes de las comidas - Tableta de Zofran 4 mg cada 8 horas SEGN SE NECESITE para las nuseas   Hacer un seguimiento: - 2-3 meses con el Dr. [name]

## 2019-10-23 ENCOUNTER — Telehealth: Payer: Self-pay | Admitting: Primary Care

## 2019-10-23 LAB — CULTURE, FUNGUS WITHOUT SMEAR: Culture: NO GROWTH

## 2019-10-23 MED ORDER — ONDANSETRON HCL 4 MG PO TABS: 4.0000 mg | ORAL_TABLET | Freq: Three times a day (TID) | ORAL | 5 refills | Status: DC | PRN

## 2019-10-23 MED ORDER — OMEPRAZOLE 20 MG PO CPDR: 20.0000 mg | DELAYED_RELEASE_CAPSULE | Freq: Two times a day (BID) | ORAL | 5 refills | Status: DC

## 2019-10-23 NOTE — Telephone Encounter (Signed)
Zofran and Prilosec sent to preferred pharmacy. Sent to wrong pharmacy yesterday.

## 2019-10-28 ENCOUNTER — Ambulatory Visit: Payer: Self-pay | Admitting: Internal Medicine

## 2019-11-02 NOTE — Progress Notes (Signed)
Subjective:    Patient ID: Eduardo Moore, male    DOB: [birth year], 39 y.o.   MRN: [id number]  11/03/2019 this visit was accomplished with Spanish interpreter [name] This is a 39 year old [place] who presents to the clinic for primary care to establish.  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.  The patient's history dates back to March of this year when after being exposed to ceiling tile that he thought had dust and other collected material that he inhaled precipitated increased breathing difficulties.  Subsequent to that he saw Dr. Chase Caller of pulmonary end of May and found to have a mass in the left lower lobe.  The patient underwent a variety of medical work-ups without a resolved diagnosis.  The patient also has a history of asthma-like symptoms.  He has continued had cough shortness of breath and episodic hemoptysis.  There is been other pulmonary work-ups done including a PET scan in June showing superior segment left lower lobe rounded nodule.  Other work-ups have shown eosinophil count of 300 and normal pulmonary function testing.  The patient subsequently went to [name] for a another opinion September 16, 2019.  He subsequent underwent biopsy and navigational bronchoscopy which was performed with [name] July 22.  Results of this did show aspergilloma on surgical biopsy from the biopsy of the bronc and as well from the micro biology studies.  Since that time the patient is pondering whether he should have a left lower lobe resection or not he was seeking other opinions.  Interestingly when I interviewed this patient with the use of a Spanish interpreter it was apparent no one had ever actually explained to him that his specific condition and also had not really shown him his imaging studies before  The patient was to see infectious disease however that appointment has  not been achieved as of yet.  He does have a return visit this week with [name].  The patient was given a Breo inhaler but he cannot afford it so he is not been taking it.  He does have a as needed albuterol inhaler.  He is still having episodes of hemoptysis.  Patient is also had nausea vomiting and low back pain along with left upper quadrant abdominal pain.  He has been in the emergency room twice for this with negative evaluations.  Again the patient is coming in today to establish for primary care.  Note this patient also is in need of a hepatitis C and HIV assay    No past medical history on file.   Family History  Problem Relation Age of Onset  . Asthma Mother      Social History   Socioeconomic History  . Marital status: Single    Spouse name: Not on file  . Number of children: Not on file  . Years of education: Not on file  . Highest education level: Not on file  Occupational History  . Not on file  Tobacco Use  . Smoking status: Never Smoker  . Smokeless tobacco: Never Used  Vaping Use  . Vaping Use: Never used  Substance and Sexual Activity  . Alcohol use: Yes    Comment: occasional  . Drug use: Never  . Sexual activity: Not on file  Other Topics Concern  . Not on file  Social History Narrative  . Not on file   Social Determinants  of Health   Financial Resource Strain:   . Difficulty of Paying Living Expenses: Not on file  Food Insecurity:   . Worried About Charity fundraiser in the Last Year: Not on file  . Ran Out of Food in the Last Year: Not on file  Transportation Needs:   . Lack of Transportation (Medical): Not on file  . Lack of Transportation (Non-Medical): Not on file  Physical Activity:   . Days of Exercise per Week: Not on file  . Minutes of Exercise per Session: Not on file  Stress:   . Feeling of Stress : Not on file  Social Connections:   . Frequency of Communication with Friends and Family: Not on file  . Frequency of  Social Gatherings with Friends and Family: Not on file  . Attends Religious Services: Not on file  . Active Member of Clubs or Organizations: Not on file  . Attends Archivist Meetings: Not on file  . Marital Status: Not on file  Intimate Partner Violence:   . Fear of Current or Ex-Partner: Not on file  . Emotionally Abused: Not on file  . Physically Abused: Not on file  . Sexually Abused: Not on file     Allergies  Allergen Reactions  . Lentil Shortness Of Breath and Rash    lentils  . Pea Shortness Of Breath and Rash    Green Peas     Outpatient Medications Prior to Visit  Medication Sig Dispense Refill  . albuterol (VENTOLIN HFA) 108 (90 Base) MCG/ACT inhaler Inhale 2 puffs into the lungs every 6 (six) hours as needed for wheezing or shortness of breath.    Marland Kitchen omeprazole (PRILOSEC) 20 MG capsule Take 1 capsule (20 mg total) by mouth 2 (two) times daily before a meal. 60 capsule 5  . ondansetron (ZOFRAN) 4 MG tablet Take 1 tablet (4 mg total) by mouth every 8 (eight) hours as needed for nausea or vomiting. 30 tablet 5  . fluticasone furoate-vilanterol (BREO ELLIPTA) 200-25 MCG/INH AEPB Inhale 1 puff into the lungs daily. (Patient not taking: Reported on 11/03/2019) 60 each 11   No facility-administered medications prior to visit.      Review of Systems  Constitutional: Negative.   HENT: Negative.   Eyes: Negative.   Respiratory: Positive for cough, chest tightness, shortness of breath and wheezing.        Hemoptysis  Cardiovascular: Positive for chest pain.  Gastrointestinal: Positive for abdominal pain, diarrhea and nausea. Negative for anal bleeding, blood in stool, constipation, rectal pain and vomiting.  Endocrine: Negative.   Genitourinary: Positive for flank pain.  Musculoskeletal: Positive for back pain.  Skin: Negative.   Neurological: Negative.   Hematological: Negative.   Psychiatric/Behavioral: Negative.        Objective:   Physical  Exam Vitals:   11/03/19 1026  BP: 124/78  Pulse: 93  Resp: 16  Temp: (!) 96.3 F (35.7 C)  SpO2: 97%  Weight: 198 lb (89.8 kg)  Height: _0  (1.727 m)    Gen: Pleasant, well-nourished, in no distress,  normal affect  ENT: No lesions,  mouth clear,  oropharynx clear, no postnasal drip  Neck: No JVD, no TMG, no carotid bruits  Lungs: No use of accessory muscles, no dullness to percussion, decreased breath sounds left lower lobe with scattered rhonchi  Cardiovascular: RRR, heart sounds normal, no murmur or gallops, no peripheral edema  Abdomen: soft and NT, no HSM,  BS normal  Musculoskeletal: No  deformities, no cyanosis or clubbing  Neuro: alert, non focal  Skin: Warm, no lesions or rashes All labs reviewed  DESCRIPTION OF PROCEDURE:  The patient was brought to the operating room on 10/02/2019.  He had induction of general anesthesia and was intubated.  A timeout was performed.  Flexible fiberoptic bronchoscopy was performed via the endotracheal tube.  It  revealed normal endobronchial anatomy with no endobronchial lesions to the level of the subsegmental bronchi.  There were some thick, clear secretions in the superior segmental bronchus.  Planning for the procedure had been done on the computer preoperatively.  The locatable guide for navigation was placed and registration was performed.  The bronchoscope then was directed to the left lower lobe bronchus and the locatable guide was advanced into the superior segmental bronchus, into the  appropriate subsegmental bronchus and then to within a centimeter of the center of the lesion with good alignment.  Position was confirmed with fluoroscopy.  Sampling then was performed.  All sampling was performed with fluoroscopy.  Needle aspirations  were performed.  The first 2 were placed on the slides.  The third aspiration was placed only in the cell block.  Next, 3 needle brushings were performed and then multiple biopsies were obtained.   Biopsies were sent for both AFB and fungal cultures, as  well as for permanent pathology.  The quick preps on the needle aspirations and brushings showed acute inflammation, no tumor cells were seen, nor were there any definite granulomas seen.  There was some mild bleeding with the biopsies.  After obtaining  sufficient biopsies for both pathology and cultures,  bronchoalveolar lavage was performed. 100 mL of saline was instilled and 10 mL was obtained with aspiration.  This was sent for AFB and fungal cultures as well.  A final inspection was made with the  bronchoscope and there was no ongoing bleeding.  The total fluoroscopy time was 4.5 minutes with a total dose of 42 milligray.  The patient then was extubated in the operating room and taken to the [facility] in good condition.   LEFT LOWER LOBE SPECIMEN C  Performed at [facility], [place] [address] [place], [place] [address]   Culture RARE FUNGUS (MOLD) ISOLATED, PROBABLE CONTAMINANT/COLONIZER (SAPROPHYTE). CONTACT MICROBIOLOGY IF FURTHER IDENTIFICATION REQUIRED [phone].    7/22 FINAL MICROSCOPIC DIAGNOSIS:   A. LUNG, LEFT LOWER LOBE, BIOPSY:  - Aspergilloma, see comment   B. LUNG, LEFT LOWER LOBE, BIOPSY:  - Aspergilloma, see comment    COMMENT:   A and B. GMS stain is confirmatory      Assessment & Plan:  I personally reviewed all images and lab data in the [facility] system as well as any outside material available during this office visit and agree with the  radiology impressions.   Aspergilloma ([place]) Left lower lobe aspergilloma proven on recent bronc biopsy.  With associated hemoptysis  Plan for this patient to follow-up with thoracic surgery this week and give consideration for resection  No additional medication changes were made at this visit  Anxiety about health This patient scored high on his depression and anxiety scale today due to anxiety over his health  After discussing with him his  condition I think he has a better understanding of what he is facing and now is ready to take on the potential surgery   [place] was seen today for hospitalization follow-up.  Diagnoses and all orders for this visit:  Dysuria -  POCT URINALYSIS DIP (CLINITEK)  Need for hepatitis C screening test -     HIV Antibody (routine testing w rflx)  Encounter for screening for HIV -     HCV Ab w/Rflx to Verification  Aspergilloma ([place])  Anxiety about health   I spent 30 minutes reviewing this patient's records prior to the visit and also 40 minutes with the patient face-to-face to review all of his records and his imaging studies.  I showed him his CT scans and spent time conferring with the patient and his spouse using the Spanish interpreter to explain his disease complex.  After this intervention the patient had full understanding of his condition and is now ready to undertake any potential surgical intervention for his left lower lobe aspergilloma.  I told him that many patients with this condition can do quite well once the primary fungus ball is removed.  Infectious disease follow-up will also be necessary  Complex medical decision making was needed  For primary care will obtain hepatitis C and HIV studies   The patient had previously already received a Covid vaccine series Note a flu vaccine and tetanus vaccine were administered at this visit

## 2019-11-03 ENCOUNTER — Encounter: Payer: Self-pay | Admitting: Critical Care Medicine

## 2019-11-03 ENCOUNTER — Other Ambulatory Visit: Payer: Self-pay

## 2019-11-03 ENCOUNTER — Ambulatory Visit: Payer: Self-pay | Attending: Critical Care Medicine | Admitting: Critical Care Medicine

## 2019-11-03 VITALS — BP 124/78 | HR 93 | Temp 96.3°F | Resp 16 | Ht 68.0 in | Wt 198.0 lb

## 2019-11-03 DIAGNOSIS — Z1159 Encounter for screening for other viral diseases: Secondary | ICD-10-CM

## 2019-11-03 DIAGNOSIS — R4589 Other symptoms and signs involving emotional state: Secondary | ICD-10-CM | POA: Insufficient documentation

## 2019-11-03 DIAGNOSIS — F418 Other specified anxiety disorders: Secondary | ICD-10-CM

## 2019-11-03 DIAGNOSIS — Z114 Encounter for screening for human immunodeficiency virus [HIV]: Secondary | ICD-10-CM

## 2019-11-03 DIAGNOSIS — B449 Aspergillosis, unspecified: Secondary | ICD-10-CM

## 2019-11-03 DIAGNOSIS — R300 Dysuria: Secondary | ICD-10-CM

## 2019-11-03 LAB — POCT URINALYSIS DIP (CLINITEK)
Bilirubin, UA: NEGATIVE
Blood, UA: NEGATIVE
Glucose, UA: NEGATIVE mg/dL
Ketones, POC UA: NEGATIVE mg/dL
Leukocytes, UA: NEGATIVE
Nitrite, UA: NEGATIVE
POC PROTEIN,UA: NEGATIVE
Spec Grav, UA: <=1.005 — AB (ref 1.010–1.025)
Urobilinogen, UA: 0.2 E.U./dL
pH, UA: 5.5 (ref 5.0–8.0)

## 2019-11-03 NOTE — Patient Instructions (Signed)
Keep your appointment with Dr [name] and consider having your lung surgery  No medication changes  Flu and Tetanus vaccine given  Lab:  Hepatitis C and HIV studies  Return Dr Delford Field two months

## 2019-11-03 NOTE — Assessment & Plan Note (Signed)
Left lower lobe aspergilloma proven on recent bronc biopsy.  With associated hemoptysis  Plan for this patient to follow-up with thoracic surgery this week and give consideration for resection  No additional medication changes were made at this visit

## 2019-11-03 NOTE — Progress Notes (Signed)
Nausea, weakness, and pressure in abdomen. Also c/o urinary frequency and dysuria.

## 2019-11-03 NOTE — Assessment & Plan Note (Signed)
This patient scored high on his depression and anxiety scale today due to anxiety over his health  After discussing with him his condition I think he has a better understanding of what he is facing and now is ready to take on the potential surgery

## 2019-11-04 ENCOUNTER — Encounter: Payer: Self-pay | Admitting: *Deleted

## 2019-11-04 ENCOUNTER — Telehealth: Payer: Self-pay | Admitting: *Deleted

## 2019-11-04 ENCOUNTER — Other Ambulatory Visit: Payer: Self-pay | Admitting: *Deleted

## 2019-11-04 ENCOUNTER — Ambulatory Visit (INDEPENDENT_AMBULATORY_CARE_PROVIDER_SITE_OTHER): Payer: Self-pay | Admitting: Thoracic Surgery (Cardiothoracic Vascular Surgery)

## 2019-11-04 VITALS — BP 135/82 | HR 82 | Temp 97.9°F | Resp 20 | Ht 68.0 in | Wt 196.0 lb

## 2019-11-04 DIAGNOSIS — B449 Aspergillosis, unspecified: Secondary | ICD-10-CM

## 2019-11-04 DIAGNOSIS — R042 Hemoptysis: Secondary | ICD-10-CM

## 2019-11-04 LAB — HIV ANTIBODY (ROUTINE TESTING W REFLEX): HIV Screen 4th Generation wRfx: NONREACTIVE

## 2019-11-04 LAB — HCV INTERPRETATION

## 2019-11-04 LAB — HCV AB W/RFLX TO VERIFICATION: HCV Ab: <0.1 s/co ratio (ref 0.0–0.9)

## 2019-11-04 NOTE — H&P (View-Only) (Signed)
[address]       [place] [address]             [phone]     HPI: [name] Moore returns to discuss management of his pulmonary aspergilloma.  [name] Moore is accompanied by a [place] interpreter.  Eduardo Moore Moore is a 39 year old Hispanic man with a history of allergies and wheezing.  He is a lifelong non-smoker.  He works Cytogeneticist.  Last spring he developed worsening wheezing and then later on hemoptysis as well.  He was found to have a left lower lobe mass as well as other cavitary areas in the left lower lobe on CT.   He saw Dr.Ramaswamy.  A QuantiFERON gold test was negative. On PET CT there was uptake with an SUV of 3.3.   He was scheduled for a robotic surgical resection but then opted not to have surgery.  He finally agreed to a bronchoscopic biopsy.  That was done on 10/02/2019.  Biopsy showed aspergilloma.  At one point he was going to move to [place] but now has decided not to move.  He was advised to have surgical resection because of the risk of life-threatening hemoptysis.  He was very reluctant to consider surgery.  He had an appointment with infectious disease, but missed that due to some abdominal discomfort.  He was seen in the emergency room for that.  A CT of the abdomen and pelvis did not show any source for his discomfort.  Patient Active Problem List   Diagnosis Date Noted  . Anxiety about health 11/03/2019  . 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 VIDEO BRONCHOSCOPY WITH ENDOBRONCHIAL NAVIGATION N/A 10/02/2019   Procedure: VIDEO BRONCHOSCOPY WITH ENDOBRONCHIAL NAVIGATION;  Surgeon: Loreli Slot, MD;  Location: [facility] OR;  Service: Thoracic;  Laterality: N/A;   Social History   Socioeconomic History  . Marital status: Single    Spouse name: Not on file  . Number of children: Not on file  . Years of education: Not on file  . Highest education level:  Not on file  Occupational History  . Not on file  Tobacco Use  . Smoking status: Never Smoker  . Smokeless tobacco: Never Used  Vaping Use  . Vaping Use: Never used  Substance and Sexual Activity  . Alcohol use: Yes    Comment: occasional  . Drug use: Never  . Sexual activity: Not on file  Other Topics Concern  . Not on file  Social History Narrative  . Not on file   Social Determinants of Health   Financial Resource Strain:   . Difficulty of Paying Living Expenses: Not on file  Food Insecurity:   . Worried About Programme researcher, broadcasting/film/video in the Last Year: Not on file  . Ran Out of Food in the Last Year: Not on file  Transportation Needs:   . Lack of Transportation (Medical): Not on file  . Lack of Transportation (Non-Medical): Not on file  Physical Activity:   . Days of Exercise per Week: Not on file  . Minutes of Exercise per Session: Not on file  Stress:   . Feeling of Stress : Not on file  Social Connections:   . Frequency of Communication with Friends and Family: Not on file  . Frequency of Social Gatherings with Friends and Family: Not on file  . Attends Religious Services: Not on file  . Active  Member of Clubs or Organizations: Not on file  . Attends Banker Meetings: Not on file  . Marital Status: Not on file  Intimate Partner Violence:   . Fear of Current or Ex-Partner: Not on file  . Emotionally Abused: Not on file  . Physically Abused: Not on file  . Sexually Abused: Not on file     Current Outpatient Medications  Medication Sig Dispense Refill  . albuterol (VENTOLIN HFA) 108 (90 Base) MCG/ACT inhaler Inhale 2 puffs into the lungs every 6 (six) hours as needed for wheezing or shortness of breath.    Marland Kitchen omeprazole (PRILOSEC) 20 MG capsule Take 1 capsule (20 mg total) by mouth 2 (two) times daily before a meal. 60 capsule 5  . ondansetron (ZOFRAN) 4 MG tablet Take 1 tablet (4 mg total) by mouth every 8 (eight) hours as needed for nausea or vomiting.  30 tablet 5  . albuterol (PROVENTIL) (2.5 MG/3ML) 0.083% nebulizer solution Take 2.5 mg by nebulization 2 (two) times daily as needed for wheezing or shortness of breath.    . B Complex Vitamins (VITAMIN B COMPLEX 100) INJ Inject 1 Dose as directed every other day.    . Cholecalciferol (VITAMIN D-3) 125 MCG (5000 UT) TABS Take 5,000 Units by mouth daily in the afternoon.    . fluticasone furoate-vilanterol (BREO ELLIPTA) 200-25 MCG/INH AEPB Inhale 1 puff into the lungs daily. (Patient not taking: Reported on 11/04/2019) 60 each 11  . Menthol (ICY HOT) 5 % PTCH Place 1 patch onto the skin daily as needed (pain).     No current facility-administered medications for this visit.    Physical Exam 39 year old Hispanic male in no acute distress Well-developed and well-nourished Alert and oriented x3 with no focal deficits Lungs clear with no wheezing bilaterally No cervical or supraclavicular adenopathy Cardiac regular rate and rhythm with a normal S1 and S2 No clubbing cyanosis or edema  Diagnostic Tests: CT CHEST WITHOUT CONTRAST  TECHNIQUE: Multidetector CT imaging of the chest was performed using thin slice collimation for electromagnetic bronchoscopy planning purposes, without intravenous contrast.  COMPARISON:  PET-CT 08/21/2019.  FINDINGS: Cardiovascular: Heart size is normal. There is no significant pericardial fluid, thickening or pericardial calcification. No atherosclerotic calcifications in the thoracic aorta or the coronary arteries.  Mediastinum/Nodes: No pathologically enlarged mediastinal or hilar lymph nodes. Please note that accurate exclusion of hilar adenopathy is limited on noncontrast CT scans. Esophagus is unremarkable in appearance. No axillary lymphadenopathy.  Lungs/Pleura: Persistent mass in the superior segment of the left lower lobe (axial image 60 of series 8 and coronal image 95 of series 4) measuring 3.7 x 3.2 x 4.2 cm with some  surrounding ground-glass attenuation, septal thickening and regional architectural distortion which likely reflects adjacent postobstructive changes. No other definite suspicious pulmonary nodules or masses are noted. Scattered areas of cylindrical and varicose bronchiectasis in the lower lobes of the lungs bilaterally. No pleural effusions.  Upper Abdomen: Unremarkable.  Musculoskeletal: There are no aggressive appearing lytic or blastic lesions noted in the visualized portions of the skeleton.  IMPRESSION: 1. Persistent mass in the superior segment of the left lower lobe measuring 3.7 x 3.2 x 4.2 cm. This remains suspicious for neoplasm, although the possibility of an infectious etiology such as a mycetoma also warrants consideration. 2. Bronchiectasis in the lower lobes of the lungs bilaterally.   Electronically Signed   By: Trudie Reed M.D.   On: 09/30/2019 11:16 I personally reviewed the CT images and concur  with the findings noted above.  FINAL MICROSCOPIC DIAGNOSIS:   A. LUNG, LEFT LOWER LOBE, BIOPSY:  - Aspergilloma, see comment   B. LUNG, LEFT LOWER LOBE, BIOPSY:  - Aspergilloma, see comment    Impression: Eduardo Moore Moore is a 39 year old Hispanic male who presented with wheezing and hemoptysis.  Work-up revealed a masslike lesion in the superior segment of the left lower lobe in addition to several other cavitary lesions.  There also is bronchiectasis in the lower lobes bilaterally.  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  He continues to have hemoptysis although it has been rather mild recently.  I again discussed with him the seriousness of an aspergilloma particularly with active hemoptysis.  In the setting of hemoptysis surgical resection is the best treatment.  I suspect he will need a lobectomy because of the other cavitary lesions and bronchiectasis.  He is a non-smoker with normal pulmonary function testing and can tolerate a lobectomy without issue.   He should be able to work at full capacity.  He remains very anxious about surgery.  I informed him of the general nature of the procedure.  We would plan to do a robotic approach, although it is possible we might have to convert to thoracotomy.  He understands the need for general anesthesia, the incisions to be used, the use of drains postoperatively, the expected hospital stay and the overall recovery.  I informed him of the indications, risks, benefits, and alternatives.  He understands the risks include, but are not limited to death, MI, DVT, PE, bleeding, possible need for transfusion, infection, prolonged air leak, cardiac arrhythmias, as well as the possibility of other unforeseeable complications.  After a long discussion and answering his questions he does wish to proceed with surgery.  We have tentatively scheduled him for Friday, 11/14/2019.  Plan: Robotic left lower lobectomy or segmentectomy on Friday, 11/14/2019  I spent over 30 minutes in review of records, images, and in consultation with [name] Moore today. Loreli Slot, MD [facility] [phone]

## 2019-11-04 NOTE — Progress Notes (Signed)
[address]       [name] [address]             [phone]     HPI: Eduardo Moore returns to discuss management of his pulmonary aspergilloma.  Eduardo Moore is accompanied by a [place] interpreter.  Eduardo Moore is a 39 year old Hispanic man with a history of allergies and wheezing.  He is a lifelong non-smoker.  He works Cytogeneticist.  Last spring he developed worsening wheezing and then later on hemoptysis as well.  He was found to have a left lower lobe mass as well as other cavitary areas in the left lower lobe on CT.   He saw Dr.Ramaswamy.  A QuantiFERON gold test was negative. On PET CT there was uptake with an SUV of 3.3.   He was scheduled for a robotic surgical resection but then opted not to have surgery.  He finally agreed to a bronchoscopic biopsy.  That was done on 10/02/2019.  Biopsy showed aspergilloma.  At one point he was going to move to [place] but now has decided not to move.  He was advised to have surgical resection because of the risk of life-threatening hemoptysis.  He was very reluctant to consider surgery.  He had an appointment with infectious disease, but missed that due to some abdominal discomfort.  He was seen in the emergency room for that.  A CT of the abdomen and pelvis did not show any source for his discomfort.  Patient Active Problem List   Diagnosis Date Noted  . Anxiety about health 11/03/2019  . 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 VIDEO BRONCHOSCOPY WITH ENDOBRONCHIAL NAVIGATION N/A 10/02/2019   Procedure: VIDEO BRONCHOSCOPY WITH ENDOBRONCHIAL NAVIGATION;  Surgeon: Loreli Slot, MD;  Location: [facility] OR;  Service: Thoracic;  Laterality: N/A;   Social History   Socioeconomic History  . Marital status: Single    Spouse name: Not on file  . Number of children: Not on file  . Years of education: Not on file  . Highest education level:  Not on file  Occupational History  . Not on file  Tobacco Use  . Smoking status: Never Smoker  . Smokeless tobacco: Never Used  Vaping Use  . Vaping Use: Never used  Substance and Sexual Activity  . Alcohol use: Yes    Comment: occasional  . Drug use: Never  . Sexual activity: Not on file  Other Topics Concern  . Not on file  Social History Narrative  . Not on file   Social Determinants of Health   Financial Resource Strain:   . Difficulty of Paying Living Expenses: Not on file  Food Insecurity:   . Worried About Programme researcher, broadcasting/film/video in the Last Year: Not on file  . Ran Out of Food in the Last Year: Not on file  Transportation Needs:   . Lack of Transportation (Medical): Not on file  . Lack of Transportation (Non-Medical): Not on file  Physical Activity:   . Days of Exercise per Week: Not on file  . Minutes of Exercise per Session: Not on file  Stress:   . Feeling of Stress : Not on file  Social Connections:   . Frequency of Communication with Friends and Family: Not on file  . Frequency of Social Gatherings with Friends and Family: Not on file  . Attends Religious Services: Not on file  . Active  Member of Clubs or Organizations: Not on file  . Attends Club or Organization Meetings: Not on file  . Marital Status: Not on file  Intimate Partner Violence:   . Fear of Current or Ex-Partner: Not on file  . Emotionally Abused: Not on file  . Physically Abused: Not on file  . Sexually Abused: Not on file     Current Outpatient Medications  Medication Sig Dispense Refill  . albuterol (VENTOLIN HFA) 108 (90 Base) MCG/ACT inhaler Inhale 2 puffs into the lungs every 6 (six) hours as needed for wheezing or shortness of breath.    . omeprazole (PRILOSEC) 20 MG capsule Take 1 capsule (20 mg total) by mouth 2 (two) times daily before a meal. 60 capsule 5  . ondansetron (ZOFRAN) 4 MG tablet Take 1 tablet (4 mg total) by mouth every 8 (eight) hours as needed for nausea or vomiting.  30 tablet 5  . albuterol (PROVENTIL) (2.5 MG/3ML) 0.083% nebulizer solution Take 2.5 mg by nebulization 2 (two) times daily as needed for wheezing or shortness of breath.    . B Complex Vitamins (VITAMIN B COMPLEX 100) INJ Inject 1 Dose as directed every other day.    . Cholecalciferol (VITAMIN D-3) 125 MCG (5000 UT) TABS Take 5,000 Units by mouth daily in the afternoon.    . fluticasone furoate-vilanterol (BREO ELLIPTA) 200-25 MCG/INH AEPB Inhale 1 puff into the lungs daily. (Patient not taking: Reported on 11/04/2019) 60 each 11  . Menthol (ICY HOT) 5 % PTCH Place 1 patch onto the skin daily as needed (pain).     No current facility-administered medications for this visit.    Physical Exam 39-year-old Hispanic male in no acute distress Well-developed and well-nourished Alert and oriented x3 with no focal deficits Lungs clear with no wheezing bilaterally No cervical or supraclavicular adenopathy Cardiac regular rate and rhythm with a normal S1 and S2 No clubbing cyanosis or edema  Diagnostic Tests: CT CHEST WITHOUT CONTRAST  TECHNIQUE: Multidetector CT imaging of the chest was performed using thin slice collimation for electromagnetic bronchoscopy planning purposes, without intravenous contrast.  COMPARISON:  PET-CT 08/21/2019.  FINDINGS: Cardiovascular: Heart size is normal. There is no significant pericardial fluid, thickening or pericardial calcification. No atherosclerotic calcifications in the thoracic aorta or the coronary arteries.  Mediastinum/Nodes: No pathologically enlarged mediastinal or hilar lymph nodes. Please note that accurate exclusion of hilar adenopathy is limited on noncontrast CT scans. Esophagus is unremarkable in appearance. No axillary lymphadenopathy.  Lungs/Pleura: Persistent mass in the superior segment of the left lower lobe (axial image 60 of series 8 and coronal image 95 of series 4) measuring 3.7 x 3.2 x 4.2 cm with some  surrounding ground-glass attenuation, septal thickening and regional architectural distortion which likely reflects adjacent postobstructive changes. No other definite suspicious pulmonary nodules or masses are noted. Scattered areas of cylindrical and varicose bronchiectasis in the lower lobes of the lungs bilaterally. No pleural effusions.  Upper Abdomen: Unremarkable.  Musculoskeletal: There are no aggressive appearing lytic or blastic lesions noted in the visualized portions of the skeleton.  IMPRESSION: 1. Persistent mass in the superior segment of the left lower lobe measuring 3.7 x 3.2 x 4.2 cm. This remains suspicious for neoplasm, although the possibility of an infectious etiology such as a mycetoma also warrants consideration. 2. Bronchiectasis in the lower lobes of the lungs bilaterally.   Electronically Signed   By: Daniel  Entrikin M.D.   On: 09/30/2019 11:16 I personally reviewed the CT images and concur   with the findings noted above.  FINAL MICROSCOPIC DIAGNOSIS:   A. LUNG, LEFT LOWER LOBE, BIOPSY:  - Aspergilloma, see comment   B. LUNG, LEFT LOWER LOBE, BIOPSY:  - Aspergilloma, see comment    Impression: Eduardo Moore is a 39 year old Hispanic male who presented with wheezing and hemoptysis.  Work-up revealed a masslike lesion in the superior segment of the left lower lobe in addition to several other cavitary lesions.  There also is bronchiectasis in the lower lobes bilaterally.  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  He continues to have hemoptysis although it has been rather mild recently.  I again discussed with him the seriousness of an aspergilloma particularly with active hemoptysis.  In the setting of hemoptysis surgical resection is the best treatment.  I suspect he will need a lobectomy because of the other cavitary lesions and bronchiectasis.  He is a non-smoker with normal pulmonary function testing and can tolerate a lobectomy without issue.   He should be able to work at full capacity.  He remains very anxious about surgery.  I informed him of the general nature of the procedure.  We would plan to do a robotic approach, although it is possible we might have to convert to thoracotomy.  He understands the need for general anesthesia, the incisions to be used, the use of drains postoperatively, the expected hospital stay and the overall recovery.  I informed him of the indications, risks, benefits, and alternatives.  He understands the risks include, but are not limited to death, MI, DVT, PE, bleeding, possible need for transfusion, infection, prolonged air leak, cardiac arrhythmias, as well as the possibility of other unforeseeable complications.  After a long discussion and answering his questions he does wish to proceed with surgery.  We have tentatively scheduled him for Friday, 11/14/2019.  Plan: Robotic left lower lobectomy or segmentectomy on Friday, 11/14/2019  I spent over 30 minutes in review of records, images, and in consultation with Eduardo Moore today. Loreli Slot, MD [facility] [phone]

## 2019-11-04 NOTE — Telephone Encounter (Signed)
Patient's family member called [facility] Spanish line, asked if they could move patient's 8/31 ID appointment to something earlier, as he is having surgery on 8/30. Per chart, patient was referred to ID for aspergillosis but was unable to keep the appointment 8/17. He is scheduled for lobectomy at [facility] 8/30 to remove the infection.  Will send message to Dr Luciana Axe, as he is scheduled to be at [facility] during that time. Andree Coss, RN

## 2019-11-05 ENCOUNTER — Telehealth: Payer: Self-pay | Admitting: Internal Medicine

## 2019-11-05 DIAGNOSIS — R911 Solitary pulmonary nodule: Secondary | ICD-10-CM

## 2019-11-05 DIAGNOSIS — Z419 Encounter for procedure for purposes other than remedying health state, unspecified: Secondary | ICD-10-CM

## 2019-11-05 DIAGNOSIS — R0609 Other forms of dyspnea: Secondary | ICD-10-CM

## 2019-11-05 NOTE — Telephone Encounter (Signed)
Patient Eduardo Moore - called me saying that he has seen Dr Roxan Hockey again an LLL lobectomy is being recommended. He wants to know if a) best option; b) if he can survive it' c) how he will survive this' d) also dealing with abd pain  Explained - Dr Roxan Hockey is right that surgery is correct option . Explained lower risk than older people. Explained residual lung function is diminished but his functional status should be good. Explained per literature anntifugnals not good option. He wanted to know why he got this - advised to meet with ID but he missed the appt.   Having abd symptoms now  Plan  - check autommune - ANA, DS-DNA, ANCA, PR-3, RF, GBM, sed rate, ACE, scl-70

## 2019-11-05 NOTE — Telephone Encounter (Signed)
Looks like surgery is the correct option   Scheduled for 9/3.   Not much from ID standpoint to change No indication to move up appt

## 2019-11-07 ENCOUNTER — Other Ambulatory Visit (HOSPITAL_COMMUNITY): Payer: Self-pay

## 2019-11-07 ENCOUNTER — Inpatient Hospital Stay (HOSPITAL_COMMUNITY): Admission: RE | Admit: 2019-11-07 | Payer: Self-pay | Source: Ambulatory Visit

## 2019-11-07 NOTE — Telephone Encounter (Signed)
Called and spoke with pt about the info stated by MR and he verbalized understanding. Lab orders have been placed. Pt stated he would probably come to office next week for labwork.nothing further needed.

## 2019-11-10 ENCOUNTER — Telehealth: Payer: Self-pay

## 2019-11-10 NOTE — Telephone Encounter (Signed)
COVID-19 Pre-Screening Questions:11/10/19  Do you currently have a fever (>100 F), chills or unexplained body aches? NO  Are you currently experiencing new cough, shortness of breath, sore throat, runny nose? NO  Have you been in contact with someone that is currently pending confirmation of Covid19 testing or has been confirmed to have the Covid19 virus?NO  **If the patient answers NO to ALL questions -  advise the patient to please call the clinic before coming to the office should any symptoms develop.     

## 2019-11-11 ENCOUNTER — Ambulatory Visit (INDEPENDENT_AMBULATORY_CARE_PROVIDER_SITE_OTHER): Payer: Self-pay | Admitting: Internal Medicine

## 2019-11-11 ENCOUNTER — Telehealth: Payer: Self-pay | Admitting: Internal Medicine

## 2019-11-11 ENCOUNTER — Ambulatory Visit: Payer: Self-pay | Admitting: Internal Medicine

## 2019-11-11 ENCOUNTER — Other Ambulatory Visit: Payer: Self-pay | Admitting: *Deleted

## 2019-11-11 ENCOUNTER — Other Ambulatory Visit: Payer: Self-pay

## 2019-11-11 ENCOUNTER — Other Ambulatory Visit (INDEPENDENT_AMBULATORY_CARE_PROVIDER_SITE_OTHER): Payer: Self-pay

## 2019-11-11 ENCOUNTER — Encounter: Payer: Self-pay | Admitting: Internal Medicine

## 2019-11-11 DIAGNOSIS — F320 Major depressive disorder, single episode, mild: Secondary | ICD-10-CM

## 2019-11-11 DIAGNOSIS — R918 Other nonspecific abnormal finding of lung field: Secondary | ICD-10-CM

## 2019-11-11 DIAGNOSIS — B449 Aspergillosis, unspecified: Secondary | ICD-10-CM

## 2019-11-11 DIAGNOSIS — Z419 Encounter for procedure for purposes other than remedying health state, unspecified: Secondary | ICD-10-CM

## 2019-11-11 DIAGNOSIS — R042 Hemoptysis: Secondary | ICD-10-CM | POA: Insufficient documentation

## 2019-11-11 DIAGNOSIS — R0600 Dyspnea, unspecified: Secondary | ICD-10-CM

## 2019-11-11 DIAGNOSIS — F32A Depression, unspecified: Secondary | ICD-10-CM | POA: Insufficient documentation

## 2019-11-11 DIAGNOSIS — R0609 Other forms of dyspnea: Secondary | ICD-10-CM

## 2019-11-11 DIAGNOSIS — R911 Solitary pulmonary nodule: Secondary | ICD-10-CM

## 2019-11-11 LAB — SEDIMENTATION RATE: Sed Rate: 15 mm/hr (ref 0–15)

## 2019-11-11 MED ORDER — BREO ELLIPTA 200-25 MCG/INH IN AEPB
1.0000 | INHALATION_SPRAY | Freq: Every day | RESPIRATORY_TRACT | 11 refills | Status: DC
Start: 2019-11-11 — End: 2019-12-22

## 2019-11-11 NOTE — Telephone Encounter (Signed)
Phone visit was scheduled for pt. Nothing further needed.

## 2019-11-11 NOTE — Assessment & Plan Note (Signed)
He appears to have a solitary left lower lobe aspergilloma causing intermittent hemoptysis.  He is scheduled for definitive surgery in 2 days.  That is not enough time to warrant starting voriconazole now.  The only likely indication for postoperative voriconazole therapy would be if there was any spillage of the aspergilloma during surgery that could lead to pleural seeding.  I will ask my partner, Dr. Merceda Elks, to assess the situation when he is hospitalized.  I tried to reassure [name] that he should be able to get through his surgery safely and have a full recovery.

## 2019-11-11 NOTE — Telephone Encounter (Signed)
TRiage  Eduardo Moore called me. HE ius anxious about upcoming surgery late rthis wek. HE sees ID today . Can you giuve him 1 phone visit tomorrow 11/12/19. So, I can regroup with hium  please . Either 8.30am or 2pm?  THanks  MR

## 2019-11-11 NOTE — Telephone Encounter (Signed)
Spoke with patient regarding message. Patient stated he never got his medication Breo from the pharmacy. I resent in a script to [facility] per patient's choice. Also Dr.Ramaswamy wanted to do a telephone visit with patient on 9/1 and offered him a appt at 2pm. Patient voice was understanding . Nothing else further needed

## 2019-11-11 NOTE — Progress Notes (Addendum)
[id number] [place], [place] - [address] AT [facility] [address] [facility] [address] [place] [address] Phone: [phone] Fax: [phone]      Your procedure is scheduled on Friday, November 14, 2019.  Report to [facility] Main Entrance "A" at 5:30 A.M., and check in at the Admitting office.  Call this number if you have problems the morning of surgery:  [phone]  Call [phone] if you have any questions prior to your surgery date Monday-Friday 8am-4pm    Remember:  Do not eat or drink after midnight the night before your surgery    Take these medicines the morning of surgery with A SIP OF WATER:  fluticasone furoate-vilanterol (BREO ELLIPTA) omeprazole (PRILOSEC)  albuterol (PROVENTIL) - as needed albuterol (VENTOLIN HFA) - as needed ondansetron (ZOFRAN) - as needed  As of today, STOP taking any Aspirin (unless otherwise instructed by your surgeon) Aleve, Naproxen, Ibuprofen, Motrin, Advil, Goody's, BC's, all herbal medications, fish oil, and all vitamins.                      Do not wear jewelry.            Do not wear lotions, powders, colognes, or deodorant.            Men may shave face and neck.            Do not bring valuables to the hospital.            [facility] is not responsible for any belongings or valuables.  Do NOT Smoke (Tobacco/Vaping) or drink Alcohol 24 hours prior to your procedure If you use a CPAP at night, you may bring all equipment for your overnight stay.   Contacts, glasses, dentures or bridgework may not be worn into surgery.      For patients admitted to the hospital, discharge time will be determined by your treatment team.   Patients discharged the day of surgery will not be allowed to drive home, and someone needs to stay with them for 24 hours.    Special instructions:   [place]- Preparing For Surgery  Before surgery, you can play an important role. Because skin is not sterile, your skin  needs to be as free of germs as possible. You can reduce the number of germs on your skin by washing with CHG (chlorahexidine gluconate) Soap before surgery.  CHG is an antiseptic cleaner which kills germs and bonds with the skin to continue killing germs even after washing.    Oral Hygiene is also important to reduce your risk of infection.  Remember - BRUSH YOUR TEETH THE MORNING OF SURGERY WITH YOUR REGULAR TOOTHPASTE  Please do not use if you have an allergy to CHG or antibacterial soaps. If your skin becomes reddened/irritated stop using the CHG.  Do not shave (including legs and underarms) for at least 48 hours prior to first CHG shower. It is OK to shave your face.  Please follow these instructions carefully.   1. Shower the NIGHT BEFORE SURGERY and the MORNING OF SURGERY with CHG Soap.   2. If you chose to wash your hair, wash your hair first as usual with your normal shampoo.  3. After you shampoo, rinse your hair and body thoroughly to remove the shampoo.  4. Use CHG as you would any other liquid soap. You can apply CHG directly to the skin and wash gently with  a scrungie or a clean washcloth.   5. Apply the CHG Soap to your body ONLY FROM THE NECK DOWN.  Do not use on open wounds or open sores. Avoid contact with your eyes, ears, mouth and genitals (private parts). Wash Face and genitals (private parts)  with your normal soap.   6. Wash thoroughly, paying special attention to the area where your surgery will be performed.  7. Thoroughly rinse your body with warm water from the neck down.  8. DO NOT shower/wash with your normal soap after using and rinsing off the CHG Soap.  9. Pat yourself dry with a CLEAN TOWEL.  10. Wear CLEAN PAJAMAS to bed the night before surgery  11. Place CLEAN SHEETS on your bed the night of your first shower and DO NOT SLEEP WITH PETS.   Day of Surgery: Wear Clean/Comfortable clothing the morning of surgery Do not apply any deodorants/lotions.    Remember to brush your teeth WITH YOUR REGULAR TOOTHPASTE.   Please read over the following fact sheets that you were given.

## 2019-11-11 NOTE — Progress Notes (Signed)
[facility]  Reason for Consult: Aspergilloma Referring Provider: Ames Dura, NP  Assessment: He appears to have a solitary left lower lobe aspergilloma causing intermittent hemoptysis.  He is scheduled for definitive surgery in 2 days.  That is not enough time to warrant starting voriconazole now.  The only likely indication for postoperative voriconazole therapy would be if there was any spillage of the aspergilloma during surgery that could lead to pleural seeding.  I will ask my partner, Dr. Merceda Elks, to assess the situation when he is hospitalized.  I tried to reassure Eduardo Moore that he should be able to get through his surgery safely and have a full recovery.   Plan: 1. We will follow-up with him when he is hospitalized for his upcoming surgery  Patient Active Problem List   Diagnosis Date Noted  . Hemoptysis 11/11/2019    Priority: High  . 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 Depression 11/11/2019  . Anxiety about health 11/03/2019    Patient's Medications  New Prescriptions   No medications on file  Previous Medications   ALBUTEROL (PROVENTIL) (2.5 MG/3ML) 0.083% NEBULIZER SOLUTION    Take 2.5 mg by nebulization 2 (two) times daily as needed for wheezing or shortness of breath.   ALBUTEROL (VENTOLIN HFA) 108 (90 BASE) MCG/ACT INHALER    Inhale 2 puffs into the lungs every 6 (six) hours as needed for wheezing or shortness of breath.   B COMPLEX VITAMINS (VITAMIN B COMPLEX 100) INJ    Inject 1 Dose as directed every other day.   CHOLECALCIFEROL (VITAMIN D-3) 125 MCG (5000 UT) TABS    Take 5,000 Units by mouth daily in the afternoon.   FLUTICASONE FUROATE-VILANTEROL (BREO ELLIPTA) 200-25 MCG/INH AEPB    Inhale 1 puff into the lungs daily.   MENTHOL (ICY HOT) 5 % PTCH    Place 1 patch onto the skin daily as needed (pain).   OMEPRAZOLE (PRILOSEC) 20 MG CAPSULE    Take 1 capsule (20 mg total) by mouth 2 (two) times daily  before a meal.   ONDANSETRON (ZOFRAN) 4 MG TABLET    Take 1 tablet (4 mg total) by mouth every 8 (eight) hours as needed for nausea or vomiting.  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  He began having problems with intermittent wheezing about 2 years ago.  Earlier this year he began to develop dyspnea on exertion and hemoptysis.  A CT scan was done at [facility] on 07/31/2019.  It showed a masslike lesion within the left upper lobe with peripheral areas of gas.  A PET scan done on 08/21/2019 showed low peripheral metabolic activity associated with the left lung nodule.  He underwent repeat CT on 09/30/2019.  It showed that the mass was actually in the superior segment of the left lower lobe.  The official report reads:  IMPRESSION: 1. Persistent mass in the superior segment of the left lower lobe measuring 3.7 x 3.2 x 4.2 cm. This remains suspicious for neoplasm, although the possibility of an infectious etiology such as a mycetoma also warrants consideration. 2. Bronchiectasis in the lower lobes of the lungs bilaterally.  He underwent bronchoscopy and needle biopsy on 10/02/2019.  The biopsy and culture revealed aspergilloma.  He was found to have a normal eosinophil level, normal serum IgE and a negative aspergillus IgE panel.  He is scheduled for a left lower lobectomy on 11/14/2019.  He says that he has been extremely worried about the surgery.  He is afraid that he will not be able to work and support his family.  His wife just had their fourth child.  He has been out of work since May and at home caring for the 3 older children.  His wife is currently hospitalized with postpartum depression.  Review of Systems: Review of Systems  Constitutional: Positive for malaise/fatigue and weight loss. Negative for chills,  diaphoresis and fever.  HENT: Negative for congestion and sore throat.   Respiratory: Positive for cough, hemoptysis, sputum production, shortness of breath and wheezing.   Cardiovascular: Negative for chest pain.  Gastrointestinal: Positive for nausea. Negative for abdominal pain, constipation, diarrhea and vomiting.  Genitourinary: Negative for dysuria.  Musculoskeletal: Positive for back pain. Negative for joint pain.  Skin: Negative for rash.  Psychiatric/Behavioral: Positive for depression. The patient is nervous/anxious.       No past medical history on file.  Social History   Tobacco Use  . Smoking status: Never Smoker  . Smokeless tobacco: Never Used  Vaping Use  . Vaping Use: Never used  Substance Use Topics  . Alcohol use: Yes    Comment: occasional  . Drug use: Never    Family History  Problem Relation Age of Onset  . Asthma Mother    Allergies  Allergen Reactions  . Lentil Shortness Of Breath and Rash    lentils  . Pea Shortness Of Breath and Rash    Green Peas    OBJECTIVE: Vitals:   11/11/19 1404 11/11/19 1405  BP:  (!) 146/85  Pulse:  90  Temp:  98.3 F (36.8 C)  SpO2:  97%  Weight: 194 lb (88 kg)   Height: 5\' 8"  (1.727 m)    Body mass index is 29.5 kg/m.   Physical Exam Constitutional:      Comments: He has recorded weights in Epic have been stable over the past 2 years.  Cardiovascular:     Rate and Rhythm: Normal rate and regular rhythm.     Heart sounds: No murmur heard.   Pulmonary:     Effort: Pulmonary effort is normal.     Breath sounds: Normal breath sounds. No wheezing, rhonchi or rales.  Abdominal:     Palpations: Abdomen is soft.     Tenderness: There is no abdominal tenderness.  Musculoskeletal:        General: No swelling or tenderness.  Skin:    Findings: No rash.  Neurological:     General: No focal deficit present.  Psychiatric:     Comments: He was quite anxious and worried during the exam.  He became tearful  when talking about what he and his wife are going through right now.     Microbiology: No results found for this or any previous visit (from the past 240 hour(s)).  , MD [facility] [phone] pager   [phone] cell 11/11/2019, 2:41 PM

## 2019-11-12 ENCOUNTER — Other Ambulatory Visit: Payer: Self-pay

## 2019-11-12 ENCOUNTER — Ambulatory Visit (INDEPENDENT_AMBULATORY_CARE_PROVIDER_SITE_OTHER): Payer: Self-pay | Admitting: Internal Medicine

## 2019-11-12 ENCOUNTER — Ambulatory Visit (HOSPITAL_COMMUNITY)
Admission: RE | Admit: 2019-11-12 | Discharge: 2019-11-12 | Disposition: A | Discharge status: Home / Self Care | Payer: Self-pay | Source: Ambulatory Visit | Attending: Thoracic Surgery (Cardiothoracic Vascular Surgery) | Admitting: Thoracic Surgery (Cardiothoracic Vascular Surgery)

## 2019-11-12 ENCOUNTER — Encounter (HOSPITAL_COMMUNITY)
Admission: RE | Admit: 2019-11-12 | Discharge: 2019-11-12 | Disposition: A | Discharge status: Home / Self Care | Payer: Self-pay | Source: Ambulatory Visit | Attending: Thoracic Surgery (Cardiothoracic Vascular Surgery) | Admitting: Thoracic Surgery (Cardiothoracic Vascular Surgery)

## 2019-11-12 ENCOUNTER — Other Ambulatory Visit (HOSPITAL_COMMUNITY)
Admission: RE | Admit: 2019-11-12 | Discharge: 2019-11-12 | Disposition: A | Discharge status: Home / Self Care | Payer: Self-pay | Source: Ambulatory Visit | Attending: Thoracic Surgery (Cardiothoracic Vascular Surgery) | Admitting: Thoracic Surgery (Cardiothoracic Vascular Surgery)

## 2019-11-12 ENCOUNTER — Encounter (HOSPITAL_COMMUNITY): Payer: Self-pay

## 2019-11-12 DIAGNOSIS — R042 Hemoptysis: Secondary | ICD-10-CM

## 2019-11-12 DIAGNOSIS — B449 Aspergillosis, unspecified: Secondary | ICD-10-CM

## 2019-11-12 DIAGNOSIS — Z20822 Contact with and (suspected) exposure to covid-19: Secondary | ICD-10-CM | POA: Insufficient documentation

## 2019-11-12 DIAGNOSIS — Z6379 Other stressful life events affecting family and household: Secondary | ICD-10-CM

## 2019-11-12 DIAGNOSIS — Z01818 Encounter for other preprocedural examination: Secondary | ICD-10-CM | POA: Insufficient documentation

## 2019-11-12 DIAGNOSIS — Z638 Other specified problems related to primary support group: Secondary | ICD-10-CM

## 2019-11-12 DIAGNOSIS — Z01811 Encounter for preprocedural respiratory examination: Secondary | ICD-10-CM

## 2019-11-12 HISTORY — DX: Gastro-esophageal reflux disease without esophagitis: K21.9

## 2019-11-12 HISTORY — DX: Dyspnea, unspecified: R06.00

## 2019-11-12 HISTORY — DX: Depression, unspecified: F32.A

## 2019-11-12 HISTORY — DX: Anxiety disorder, unspecified: F41.9

## 2019-11-12 LAB — BLOOD GAS, ARTERIAL
Acid-Base Excess: 1.1 mmol/L (ref 0.0–2.0)
Bicarbonate: 25.4 mmol/L (ref 20.0–28.0)
Drawn by: 58793
FIO2: 21
O2 Saturation: 96.8 %
Patient temperature: 37
pCO2 arterial: 41.8 mmHg (ref 32.0–48.0)
pH, Arterial: 7.4 (ref 7.350–7.450)
pO2, Arterial: 90.3 mmHg (ref 83.0–108.0)

## 2019-11-12 LAB — GLOMERULAR BASEMENT MEMBRANE ANTIBODIES: GBM Ab: <1 AI

## 2019-11-12 LAB — URINALYSIS, ROUTINE W REFLEX MICROSCOPIC
Bilirubin Urine: NEGATIVE
Glucose, UA: NEGATIVE mg/dL
Hgb urine dipstick: NEGATIVE
Ketones, ur: NEGATIVE mg/dL
Leukocytes,Ua: NEGATIVE
Nitrite: NEGATIVE
Protein, ur: NEGATIVE mg/dL
Specific Gravity, Urine: 1.003 — ABNORMAL LOW (ref 1.005–1.030)
pH: 6 (ref 5.0–8.0)

## 2019-11-12 LAB — CBC
HCT: 42.8 % (ref 39.0–52.0)
Hemoglobin: 13.7 g/dL (ref 13.0–17.0)
MCH: 28.9 pg (ref 26.0–34.0)
MCHC: 32 g/dL (ref 30.0–36.0)
MCV: 90.3 fL (ref 80.0–100.0)
Platelets: 169 10*3/uL (ref 150–400)
RBC: 4.74 MIL/uL (ref 4.22–5.81)
RDW: 12 % (ref 11.5–15.5)
WBC: 7.6 10*3/uL (ref 4.0–10.5)
nRBC: 0 % (ref 0.0–0.2)

## 2019-11-12 LAB — COMPREHENSIVE METABOLIC PANEL
ALT: 34 U/L (ref 0–44)
AST: 16 U/L (ref 15–41)
Albumin: 3.9 g/dL (ref 3.5–5.0)
Alkaline Phosphatase: 68 U/L (ref 38–126)
Anion gap: 10 (ref 5–15)
BUN: 8 mg/dL (ref 6–20)
CO2: 21 mmol/L — ABNORMAL LOW (ref 22–32)
Calcium: 9.1 mg/dL (ref 8.9–10.3)
Chloride: 106 mmol/L (ref 98–111)
Creatinine, Ser: 0.81 mg/dL (ref 0.61–1.24)
GFR calc Af Amer: >60 mL/min (ref >60)
GFR calc non Af Amer: >60 mL/min (ref >60)
Glucose, Bld: 104 mg/dL — ABNORMAL HIGH (ref 70–99)
Potassium: 4.1 mmol/L (ref 3.5–5.1)
Sodium: 137 mmol/L (ref 135–145)
Total Bilirubin: 1 mg/dL (ref 0.3–1.2)
Total Protein: 6.9 g/dL (ref 6.5–8.1)

## 2019-11-12 LAB — SARS CORONAVIRUS 2 (TAT 6-24 HRS): SARS Coronavirus 2: NEGATIVE

## 2019-11-12 LAB — ANGIOTENSIN CONVERTING ENZYME: Angiotensin-Converting Enzyme: 20 U/L (ref 9–67)

## 2019-11-12 LAB — SEDIMENTATION RATE

## 2019-11-12 LAB — ANTI-DNA ANTIBODY, DOUBLE-STRANDED: ds DNA Ab: <1 [IU]/mL

## 2019-11-12 LAB — SURGICAL PCR SCREEN
MRSA, PCR: POSITIVE — AB
Staphylococcus aureus: POSITIVE — AB

## 2019-11-12 LAB — MPO/PR-3 (ANCA) ANTIBODIES
Myeloperoxidase Abs: <1 AI
Serine Protease 3: <1 AI

## 2019-11-12 LAB — ANTI-SCLERODERMA ANTIBODY: Scleroderma (Scl-70) (ENA) Antibody, IgG: <1 AI

## 2019-11-12 LAB — PROTIME-INR
INR: 1 (ref 0.8–1.2)
Prothrombin Time: 13.2 seconds (ref 11.4–15.2)

## 2019-11-12 LAB — ANCA SCREEN W REFLEX TITER: ANCA Screen: NEGATIVE

## 2019-11-12 LAB — RHEUMATOID FACTOR: Rheumatoid fact SerPl-aCnc: <14 IU/mL (ref <14)

## 2019-11-12 LAB — ANA: Anti Nuclear Antibody (ANA): NEGATIVE

## 2019-11-12 LAB — APTT: aPTT: 31 seconds (ref 24–36)

## 2019-11-12 NOTE — Progress Notes (Addendum)
PCP - Shan Levans Cardiologist - denies Pulm.: Ramaswamy   Chest x-ray -  11/12/19 EKG - 11/12/19 Stress Test - na ECHO - na Cardiac Cath - na  Sleep Study - na   Fasting Blood Sugar - na Checks Blood Sugar _____ times a day  Blood Thinner Instructions: na Aspirin Instructions: na   COVID TEST- 11/12/19  Today an  Interpreter was present: Estill Bamberg  Anesthesia review: Pt. C/o difficulty is swallowing in morning and it goes away in the afternoon. Just started 11/11/19.  Patient denies shortness of breath, fever, cough and chest pain at PAT appointment   All instructions explained to the patient, with a verbal understanding of the material. Patient agrees to go over the instructions while at home for a better understanding. Patient also instructed to self quarantine after being tested for COVID-19. The opportunity to ask questions was provided.

## 2019-11-12 NOTE — Patient Instructions (Addendum)
ICD-10-CM   1. 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 Hemoptysis  R04.2   3. Preop respiratory exam  Z01.811   4. Wife unwell  Z63.8     Recommended surgery  Recommend anxiety/depression - Rx

## 2019-11-12 NOTE — Progress Notes (Signed)
OV 11/12/2019  - telephone visit  Subjective:  Patient ID: Eduardo Moore, male , DOB: 09-Dec-1980 , age 39 y.o. , MRN: [id number] , ADDRESS: [address] [place] [address]   11/12/2019 -   Chief Complaint  Patient presents with  . Follow-up    denies cough, reports occasional shortness of breath   Type of visit: Telephone/Video Circumstance: COVID-19 national emergency Identification of patient Eduardo Moore with [birth year] and MRN [id number] - 2 person identifier Risks: Risks, benefits, limitations of telephone visit explained. Patient understood and verbalized agreement to proceed Anyone else on call:  - NO Patient location: - at home This provider location: [address], [place], Pulm office  HPI Eduardo Moore 39 y.o. 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 Says he is very concerned about surgery. Says lot of things going on in family. Wife admitted with post partum depression - [facility]. Says he is anxious about surgery. Says his sister and mother in law will be with family including his teen child. Has newborn baby 15 month old. Says wife saw him crying about his own health.   Says he willl do surgeyr if this is best option (he has been told many times this is best option). He still does occasionally cough blood intermittent every few weeks. Explained aspergilloma can cause massive hemoptysis unexpectedly. Also worried abot loss of lung function (this has been explained in past) but again explained generally speaking he should be functional without o2 but there will be restriction to lung function  ID consult note from  Yesterday reviewed.  ID consult likely will folllow posoperatively  ROS - per HPI  Results for Eduardo Moore (MRN [id number]) as of 11/12/2019 12:53  Ref. Range 09/12/2019 11:07 10/10/2019 10:44 11/11/2019 09:14  Angiotensin-Converting Enzyme Latest Ref Range: 9 - 67 U/L   20  RA Latex Turbid. Latest Ref  Range: <14 IU/mL   <14  IgE (Immunoglobulin E), Serum Latest Ref Range: 6 - 495 IU/mL 238     IMPRESSION: 1. Low peripheral metabolic activity associated with the LEFT lower lobe rounded pulmonary nodule. The relatively low metabolic activity for size and central fluid density favor benign infectious process. Cannot exclude a low metabolically active lesion such is mucinous carcinoma. Recommend follow-up CT of the thorax with contrast in 1-3 months to demonstrate reduction in volume. 2. Prominent LEFT hilar lymph node is likely reactive. 3. No distant metastatic disease. 4. No evidence of primary malignancy elsewhere on the whole-body scan.   Electronically Signed   By: Genevive Bi M.D.   On: 08/21/2019 16:34     has a past medical history of Anxiety, Depression, Dyspnea, and GERD (gastroesophageal reflux disease).   reports that he has never smoked. He has never used smokeless tobacco.  Past Surgical History:  Procedure Laterality Date  . VIDEO BRONCHOSCOPY WITH ENDOBRONCHIAL NAVIGATION N/A 10/02/2019   Procedure: VIDEO BRONCHOSCOPY WITH ENDOBRONCHIAL NAVIGATION;  Surgeon: Loreli Slot, MD;  Location: [facility] OR;  Service: Thoracic;  Laterality: N/A;    Allergies  Allergen Reactions  . Lentil Shortness Of Breath and Rash    lentils  . Pea Shortness Of Breath and Rash    Green Peas    Immunization History  Administered Date(s) Administered  . Influenza,inj,Quad PF,6+ Mos 11/03/2019  . PFIZER SARS-COV-2 Vaccination 07/08/2019, 07/29/2019  . Tdap 11/03/2019    Family History  Problem Relation Age of Onset  . Asthma Mother  Current Outpatient Medications:  .  albuterol (PROVENTIL) (2.5 MG/3ML) 0.083% nebulizer solution, Take 2.5 mg by nebulization 2 (two) times daily as needed for wheezing or shortness of breath., Disp: , Rfl:  .  albuterol (VENTOLIN HFA) 108 (90 Base) MCG/ACT inhaler, Inhale 2 puffs into the lungs every 6 (six) hours as needed for  wheezing or shortness of breath., Disp: , Rfl:  .  B Complex Vitamins (VITAMIN B COMPLEX 100) INJ, Inject 1 Dose as directed every other day. , Disp: , Rfl:  .  Cholecalciferol (VITAMIN D-3) 125 MCG (5000 UT) TABS, Take 5,000 Units by mouth daily in the afternoon., Disp: , Rfl:  .  fluticasone furoate-vilanterol (BREO ELLIPTA) 200-25 MCG/INH AEPB, Inhale 1 puff into the lungs daily., Disp: 60 each, Rfl: 11 .  Menthol (ICY HOT) 5 % PTCH, Place 1 patch onto the skin daily as needed (pain). , Disp: , Rfl:  .  omeprazole (PRILOSEC) 20 MG capsule, Take 1 capsule (20 mg total) by mouth 2 (two) times daily before a meal., Disp: 60 capsule, Rfl: 5 .  ondansetron (ZOFRAN) 4 MG tablet, Take 1 tablet (4 mg total) by mouth every 8 (eight) hours as needed for nausea or vomiting., Disp: 30 tablet, Rfl: 5      Objective:   There were no vitals filed for this visit.  Estimated body mass index is 30.24 kg/m as calculated from the following:   Height as of 11/11/19: 5\' 8"  (1.727 m).   Weight as of an earlier encounter on 11/12/19: 198 lb 14.4 oz (90.2 kg).  @WEIGHTCHANGE @  There were no vitals filed for this visit.   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 Hemoptysis  R04.2   3. Preop respiratory exam  Z01.811   4. Wife unwell  Z26.8    Wife with postpartum depression    Plan:     Patient Instructions     ICD-10-CM   1. 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 Hemoptysis  R04.2   3. Preop respiratory exam  Z01.811   4. Wife unwell  Z63.8     Recommended surgery    Likely needs psychosocial counseling and Rx for anxiety/depression. Will route message to Dr    (Telephone visit - Level 02 visit: Estb 11-20 for this visit type which was visit type: telephone visit in total care time and counseling or/and coordination of care by this undersigned MD - Dr Delford Field. This includes one or more of  the following for care delivered on 11/12/2019 same day: pre-charting, chart review, note writing, documentation discussion of test results, diagnostic or treatment recommendations, prognosis, risks and benefits of management options, instructions, education, compliance or risk-factor reduction. It excludes time spent by the CMA or office staff in the care of the patient. Actual time was 15 min. E&M code is [phone])    SIGNATURE    Dr. 01/12/2020, M.D., F.C.C.P,  Pulmonary and Critical Care Medicine Staff Physician, [facility] Center Director - Interstitial Lung Disease  Program  Pulmonary Fibrosis [facility] at [facility] [place], [facility], [place]  Pager: [phone], If no answer or between  15:00h - 7:00h: call [phone] Telephone: [phone]  1:05 PM 11/12/2019

## 2019-11-13 NOTE — Anesthesia Preprocedure Evaluation (Addendum)
Anesthesia Evaluation  Patient identified by MRN, date of birth, ID band Patient awake    Reviewed: Allergy & Precautions, NPO status , Patient's Chart, lab work & pertinent test results  History of Anesthesia Complications Negative for: history of anesthetic complications  Airway Mallampati: I  TM Distance: >3 FB Neck ROM: Full    Dental  (+) Missing, Partial Lower, Dental Advisory Given   Pulmonary COPD,  COPD inhaler,  11/12/2019 SARS coronavirus NEG  Persistent mass in the superior segment of the left lower lobe measuring 3.7 x 3.2 x 4.2 cm   breath sounds clear to auscultation       Cardiovascular negative cardio ROS   Rhythm:Regular Rate:Normal     Neuro/Psych Anxiety Depression negative neurological ROS     GI/Hepatic Neg liver ROS, GERD  Medicated and Controlled,  Endo/Other  negative endocrine ROS  Renal/GU negative Renal ROS     Musculoskeletal   Abdominal   Peds  Hematology negative hematology ROS (+)   Anesthesia Other Findings   Reproductive/Obstetrics                            Anesthesia Physical Anesthesia Plan  ASA: III  Anesthesia Plan: General   Post-op Pain Management:    Induction: Intravenous  PONV Risk Score and Plan: 2 and Ondansetron, Dexamethasone and Treatment may vary due to age or medical condition  Airway Management Planned: Double Lumen EBT  Additional Equipment: Arterial line  Intra-op Plan:   Post-operative Plan: Extubation in OR  Informed Consent: I have reviewed the patients History and Physical, chart, labs and discussed the procedure including the risks, benefits and alternatives for the proposed anesthesia with the patient or authorized representative who has indicated his/her understanding and acceptance.     Dental advisory given and Interpreter used for interveiw  Plan Discussed with: CRNA and Surgeon  Anesthesia Plan Comments:  (PAT note by Antionette Poles, PA-C: I was called to examine patient at PAT appointment due to his report of tonsillar swelling.  He describes a 1 day history of feeling like his tonsils are swollen, was worse this morning but is somewhat better now.  He denies any fever or other signs/symptoms of systemic illness.  On my exam I do not appreciate any tonsillar erythema or exudate.  I do not appreciate any lymphadenopathy.  He does seem very concerned about his symptoms with upcoming surgery.  He was advised to follow-up with primary care provider for further evaluation.  He does also admit there is extremely anxious about his upcoming surgery.  He is canceled several times previously.  Some of the symptoms may certainly be anxiety related.  I also called and spoke with Darius Bump, RN at Dr. Sunday Corn office to make her aware of patient report.  She said she would advise Dr. [name].  Addendum 11/13/19: I called and spoke with patient to followup on report of swollen tonsils. He states this has resolved, woke up today with no issues, overall feeling well.   Last spring he developed worsening wheezing and then later on hemoptysis as well.  He was found to have a left lower lobe mass as well as other cavitary areas in the left lower lobe on CT.   He saw Dr.Ramaswamy.  A QuantiFERON gold test was negative. On PET CT there was uptake with an SUV of 3.3. He was scheduled for a robotic surgical resection but then opted not to have surgery.  He finally agreed to a bronchoscopic biopsy.  That was done on 10/02/2019.  Biopsy showed aspergilloma.  Preop labs reviewed, unremarkable.  EKG 8/2/1: Normal sinus rhythm. Rate 77.  CHEST - 2 VIEW 11/12/19: COMPARISON:  CT chest and chest radiograph 09/30/2019.  FINDINGS: Trachea is midline. Heart size normal. Superior segment left lower lobe masslike consolidation appears grossly unchanged. Lungs are otherwise clear. No pleural fluid.  IMPRESSION: Superior segment  left lower lobe masslike consolidation appears grossly unchanged and better evaluated on cross-sectional imaging 09/30/2019 as well as PET 08/21/2019.  CT Chest 09/30/19: IMPRESSION: 1. Persistent mass in the superior segment of the left lower lobe measuring 3.7 x 3.2 x 4.2 cm. This remains suspicious for neoplasm, although the possibility of an infectious etiology such as a mycetoma also warrants consideration. 2. Bronchiectasis in the lower lobes of the lungs bilaterally.  )     Anesthesia Quick Evaluation

## 2019-11-13 NOTE — Progress Notes (Addendum)
Anesthesia Chart Review:  I was called to examine patient at PAT appointment due to his report of tonsillar swelling.  He describes a 1 day history of feeling like his tonsils are swollen, was worse this morning but is somewhat better now.  He denies any fever or other signs/symptoms of systemic illness.  On my exam I do not appreciate any tonsillar erythema or exudate.  I do not appreciate any lymphadenopathy.  He does seem very concerned about his symptoms with upcoming surgery.  He was advised to follow-up with primary care provider for further evaluation.  He does also admit there is extremely anxious about his upcoming surgery.  He is canceled several times previously.  Some of the symptoms may certainly be anxiety related.  I also called and spoke with Darius Bump, RN at Dr. Sunday Corn office to make her aware of patient report.  She said she would advise Dr. [name].  Addendum 11/13/19: I called and spoke with patient to followup on report of swollen tonsils. He states this has resolved, woke up today with no issues, overall feeling well.   Last spring he developed worsening wheezing and then later on hemoptysis as well.  He was found to have a left lower lobe mass as well as other cavitary areas in the left lower lobe on CT.   He saw Dr.Ramaswamy.  A QuantiFERON gold test was negative. On PET CT there was uptake with an SUV of 3.3. He was scheduled for a robotic surgical resection but then opted not to have surgery.  He finally agreed to a bronchoscopic biopsy.  That was done on 10/02/2019.  Biopsy showed aspergilloma.  Preop labs reviewed, unremarkable.  EKG 8/2/1: Normal sinus rhythm. Rate 77.  CHEST - 2 VIEW 11/12/19: COMPARISON:  CT chest and chest radiograph 09/30/2019.  FINDINGS: Trachea is midline. Heart size normal. Superior segment left lower lobe masslike consolidation appears grossly unchanged. Lungs are otherwise clear. No pleural fluid.  IMPRESSION: Superior segment left  lower lobe masslike consolidation appears grossly unchanged and better evaluated on cross-sectional imaging 09/30/2019 as well as PET 08/21/2019.  CT Chest 09/30/19: IMPRESSION: 1. Persistent mass in the superior segment of the left lower lobe measuring 3.7 x 3.2 x 4.2 cm. This remains suspicious for neoplasm, although the possibility of an infectious etiology such as a mycetoma also warrants consideration. 2. Bronchiectasis in the lower lobes of the lungs bilaterally.   Zannie Cove [facility] Short Stay Center/Anesthesiology Phone [phone] 11/13/2019 10:34 AM

## 2019-11-14 ENCOUNTER — Encounter (HOSPITAL_COMMUNITY): Payer: Self-pay | Admitting: Thoracic Surgery (Cardiothoracic Vascular Surgery)

## 2019-11-14 ENCOUNTER — Inpatient Hospital Stay (HOSPITAL_COMMUNITY): Payer: Self-pay | Admitting: Physician Assistant

## 2019-11-14 ENCOUNTER — Encounter (HOSPITAL_COMMUNITY)
Admission: RE | Disposition: A | Discharge status: Home / Self Care | Payer: Self-pay | Source: Home / Self Care | Attending: Thoracic Surgery (Cardiothoracic Vascular Surgery)

## 2019-11-14 ENCOUNTER — Inpatient Hospital Stay (HOSPITAL_COMMUNITY)
Admission: RE | Admit: 2019-11-14 | Discharge: 2019-11-18 | DRG: 854 | Disposition: A | Discharge status: Home / Self Care | Payer: Self-pay | Attending: Thoracic Surgery (Cardiothoracic Vascular Surgery) | Admitting: Thoracic Surgery (Cardiothoracic Vascular Surgery)

## 2019-11-14 ENCOUNTER — Inpatient Hospital Stay (HOSPITAL_COMMUNITY): Payer: Self-pay

## 2019-11-14 ENCOUNTER — Other Ambulatory Visit: Payer: Self-pay

## 2019-11-14 ENCOUNTER — Inpatient Hospital Stay (HOSPITAL_COMMUNITY): Payer: Self-pay | Admitting: Certified Registered Nurse Anesthetist

## 2019-11-14 DIAGNOSIS — K219 Gastro-esophageal reflux disease without esophagitis: Secondary | ICD-10-CM | POA: Diagnosis present

## 2019-11-14 DIAGNOSIS — F329 Major depressive disorder, single episode, unspecified: Secondary | ICD-10-CM | POA: Diagnosis present

## 2019-11-14 DIAGNOSIS — J948 Other specified pleural conditions: Secondary | ICD-10-CM | POA: Diagnosis present

## 2019-11-14 DIAGNOSIS — B449 Aspergillosis, unspecified: Secondary | ICD-10-CM

## 2019-11-14 DIAGNOSIS — B441 Other pulmonary aspergillosis: Principal | ICD-10-CM | POA: Diagnosis present

## 2019-11-14 DIAGNOSIS — Z825 Family history of asthma and other chronic lower respiratory diseases: Secondary | ICD-10-CM

## 2019-11-14 DIAGNOSIS — Z9889 Other specified postprocedural states: Secondary | ICD-10-CM

## 2019-11-14 DIAGNOSIS — J479 Bronchiectasis, uncomplicated: Secondary | ICD-10-CM | POA: Diagnosis present

## 2019-11-14 DIAGNOSIS — D62 Acute posthemorrhagic anemia: Secondary | ICD-10-CM | POA: Diagnosis not present

## 2019-11-14 DIAGNOSIS — I10 Essential (primary) hypertension: Secondary | ICD-10-CM | POA: Diagnosis present

## 2019-11-14 DIAGNOSIS — Z91018 Allergy to other foods: Secondary | ICD-10-CM

## 2019-11-14 DIAGNOSIS — Z09 Encounter for follow-up examination after completed treatment for conditions other than malignant neoplasm: Secondary | ICD-10-CM

## 2019-11-14 DIAGNOSIS — R042 Hemoptysis: Secondary | ICD-10-CM

## 2019-11-14 HISTORY — PX: INTERCOSTAL NERVE BLOCK: SHX5021

## 2019-11-14 HISTORY — PX: LYMPH NODE DISSECTION: SHX5087

## 2019-11-14 LAB — GLUCOSE, CAPILLARY: Glucose-Capillary: 141 mg/dL — ABNORMAL HIGH (ref 70–99)

## 2019-11-14 LAB — ABO/RH: ABO/RH(D): O POS

## 2019-11-14 LAB — IGE: IgE (Immunoglobulin E), Serum: 194 IU/mL (ref 6–495)

## 2019-11-14 LAB — PREPARE RBC (CROSSMATCH)

## 2019-11-14 SURGERY — LOBECTOMY, LUNG, ROBOT-ASSISTED, USING VATS
Anesthesia: General | Site: Chest

## 2019-11-14 MED ORDER — MIDAZOLAM HCL 2 MG/2ML IJ SOLN: 0.5000 mg | Freq: Once | INTRAMUSCULAR | Status: DC | PRN

## 2019-11-14 MED ORDER — ACETAMINOPHEN 500 MG PO TABS
1000.0000 mg | ORAL_TABLET | Freq: Four times a day (QID) | ORAL | Status: DC
  Administered 2019-11-14 – 2019-11-18 (×15): 1000 mg via ORAL
  Filled 2019-11-14 (×16): qty 2

## 2019-11-14 MED ORDER — ORAL CARE MOUTH RINSE: 15.0000 mL | Freq: Once | OROMUCOSAL | Status: AC

## 2019-11-14 MED ORDER — OXYCODONE HCL 5 MG PO TABS: 5.0000 mg | ORAL_TABLET | Freq: Once | ORAL | Status: DC | PRN

## 2019-11-14 MED ORDER — DIPHENHYDRAMINE HCL 50 MG/ML IJ SOLN
12.5000 mg | Freq: Four times a day (QID) | INTRAMUSCULAR | Status: DC | PRN
  Filled 2019-11-14: qty 0.25

## 2019-11-14 MED ORDER — ONDANSETRON HCL 4 MG/2ML IJ SOLN
INTRAMUSCULAR | Status: DC | PRN
  Administered 2019-11-14: 4 mg via INTRAVENOUS

## 2019-11-14 MED ORDER — FENTANYL 50 MCG/ML IV PCA SOLN
INTRAVENOUS | Status: DC
  Administered 2019-11-15: 15 ug via INTRAVENOUS
  Administered 2019-11-15: 30 ug via INTRAVENOUS
  Administered 2019-11-15: 75 ug via INTRAVENOUS
  Administered 2019-11-15: 45 ug via INTRAVENOUS
  Administered 2019-11-15: 195 ug via INTRAVENOUS
  Administered 2019-11-16: 15 ug via INTRAVENOUS
  Administered 2019-11-16: 0 ug via INTRAVENOUS

## 2019-11-14 MED ORDER — FENTANYL CITRATE (PF) 250 MCG/5ML IJ SOLN
INTRAMUSCULAR | Status: AC
Start: 2019-11-14 — End: ?
  Filled 2019-11-14: qty 5

## 2019-11-14 MED ORDER — PROPOFOL 10 MG/ML IV BOLUS
INTRAVENOUS | Status: DC | PRN
  Administered 2019-11-14: 50 mg via INTRAVENOUS
  Administered 2019-11-14: 150 mg via INTRAVENOUS

## 2019-11-14 MED ORDER — SODIUM CHLORIDE FLUSH 0.9 % IV SOLN
INTRAVENOUS | Status: DC | PRN
  Administered 2019-11-14: 100 mL

## 2019-11-14 MED ORDER — ALBUMIN HUMAN 5 % IV SOLN: INTRAVENOUS | Status: DC | PRN

## 2019-11-14 MED ORDER — CHLORHEXIDINE GLUCONATE 0.12 % MT SOLN
15.0000 mL | Freq: Once | OROMUCOSAL | Status: AC
  Administered 2019-11-14: 15 mL via OROMUCOSAL
  Filled 2019-11-14: qty 15

## 2019-11-14 MED ORDER — TRAMADOL HCL 50 MG PO TABS
50.0000 mg | ORAL_TABLET | Freq: Four times a day (QID) | ORAL | Status: DC | PRN
  Administered 2019-11-15 – 2019-11-16 (×2): 100 mg via ORAL
  Filled 2019-11-14 (×2): qty 2

## 2019-11-14 MED ORDER — ALBUTEROL SULFATE (2.5 MG/3ML) 0.083% IN NEBU: 3.0000 mL | INHALATION_SOLUTION | Freq: Four times a day (QID) | RESPIRATORY_TRACT | Status: DC | PRN

## 2019-11-14 MED ORDER — PROPOFOL 10 MG/ML IV BOLUS
INTRAVENOUS | Status: AC
  Filled 2019-11-14: qty 40

## 2019-11-14 MED ORDER — MIDAZOLAM HCL 2 MG/2ML IJ SOLN
INTRAMUSCULAR | Status: AC
  Filled 2019-11-14: qty 2

## 2019-11-14 MED ORDER — BUPIVACAINE LIPOSOME 1.3 % IJ SUSP
20.0000 mL | Freq: Once | INTRAMUSCULAR | Status: DC
  Filled 2019-11-14: qty 20

## 2019-11-14 MED ORDER — SODIUM CHLORIDE 0.9 % IV SOLN: INTRAVENOUS | Status: DC

## 2019-11-14 MED ORDER — KETOROLAC TROMETHAMINE 30 MG/ML IJ SOLN
INTRAMUSCULAR | Status: AC
  Filled 2019-11-14: qty 1

## 2019-11-14 MED ORDER — FLUTICASONE FUROATE-VILANTEROL 200-25 MCG/INH IN AEPB
1.0000 | INHALATION_SPRAY | Freq: Every day | RESPIRATORY_TRACT | Status: DC
  Administered 2019-11-15 – 2019-11-18 (×4): 1 via RESPIRATORY_TRACT
  Filled 2019-11-14: qty 28

## 2019-11-14 MED ORDER — KETOROLAC TROMETHAMINE 30 MG/ML IJ SOLN
INTRAMUSCULAR | Status: DC | PRN
  Administered 2019-11-14: 30 mg via INTRAVENOUS

## 2019-11-14 MED ORDER — LIDOCAINE 2% (20 MG/ML) 5 ML SYRINGE
INTRAMUSCULAR | Status: DC | PRN
  Administered 2019-11-14: 40 mg via INTRAVENOUS

## 2019-11-14 MED ORDER — EPHEDRINE 5 MG/ML INJ
INTRAVENOUS | Status: AC
  Filled 2019-11-14: qty 10

## 2019-11-14 MED ORDER — SUGAMMADEX SODIUM 200 MG/2ML IV SOLN
INTRAVENOUS | Status: DC | PRN
  Administered 2019-11-14: 200 mg via INTRAVENOUS
  Administered 2019-11-14: 50 mg via INTRAVENOUS

## 2019-11-14 MED ORDER — SODIUM CHLORIDE 0.9% IV SOLUTION: Freq: Once | INTRAVENOUS | Status: DC

## 2019-11-14 MED ORDER — LACTATED RINGERS IV SOLN: INTRAVENOUS | Status: DC | PRN

## 2019-11-14 MED ORDER — CHLORHEXIDINE GLUCONATE CLOTH 2 % EX PADS
6.0000 | MEDICATED_PAD | Freq: Every day | CUTANEOUS | Status: DC
  Administered 2019-11-15 – 2019-11-16 (×2): 6 via TOPICAL

## 2019-11-14 MED ORDER — SODIUM CHLORIDE 0.9 % IV SOLN: INTRAVENOUS | Status: DC | PRN

## 2019-11-14 MED ORDER — PHENYLEPHRINE 40 MCG/ML (10ML) SYRINGE FOR IV PUSH (FOR BLOOD PRESSURE SUPPORT)
PREFILLED_SYRINGE | INTRAVENOUS | Status: AC
  Filled 2019-11-14: qty 10

## 2019-11-14 MED ORDER — DEXAMETHASONE SODIUM PHOSPHATE 10 MG/ML IJ SOLN
INTRAMUSCULAR | Status: AC
  Filled 2019-11-14: qty 1

## 2019-11-14 MED ORDER — ENOXAPARIN SODIUM 40 MG/0.4ML ~~LOC~~ SOLN
40.0000 mg | Freq: Every day | SUBCUTANEOUS | Status: DC
  Administered 2019-11-15 – 2019-11-17 (×3): 40 mg via SUBCUTANEOUS
  Filled 2019-11-14 (×3): qty 0.4

## 2019-11-14 MED ORDER — ONDANSETRON HCL 4 MG/2ML IJ SOLN
4.0000 mg | Freq: Four times a day (QID) | INTRAMUSCULAR | Status: DC | PRN
  Filled 2019-11-14: qty 2

## 2019-11-14 MED ORDER — BUPIVACAINE HCL (PF) 0.5 % IJ SOLN
INTRAMUSCULAR | Status: AC
  Filled 2019-11-14: qty 30

## 2019-11-14 MED ORDER — KETOROLAC TROMETHAMINE 15 MG/ML IJ SOLN
15.0000 mg | Freq: Four times a day (QID) | INTRAMUSCULAR | Status: DC
  Administered 2019-11-14 – 2019-11-18 (×15): 15 mg via INTRAVENOUS
  Filled 2019-11-14 (×15): qty 1

## 2019-11-14 MED ORDER — BISACODYL 5 MG PO TBEC
10.0000 mg | DELAYED_RELEASE_TABLET | Freq: Every day | ORAL | Status: DC
  Administered 2019-11-16 – 2019-11-18 (×3): 10 mg via ORAL
  Filled 2019-11-14 (×3): qty 2

## 2019-11-14 MED ORDER — ACETAMINOPHEN 160 MG/5ML PO SOLN: 1000.0000 mg | Freq: Four times a day (QID) | ORAL | Status: DC

## 2019-11-14 MED ORDER — HYDROMORPHONE HCL 1 MG/ML IJ SOLN
0.2500 mg | INTRAMUSCULAR | Status: DC | PRN
  Administered 2019-11-14 (×2): 0.5 mg via INTRAVENOUS

## 2019-11-14 MED ORDER — HYDROMORPHONE HCL 1 MG/ML IJ SOLN
INTRAMUSCULAR | Status: AC
Start: 2019-11-14 — End: 2019-11-15
  Filled 2019-11-14: qty 1

## 2019-11-14 MED ORDER — LACTATED RINGERS IV SOLN: INTRAVENOUS | Status: DC

## 2019-11-14 MED ORDER — PROMETHAZINE HCL 25 MG/ML IJ SOLN: 6.2500 mg | INTRAMUSCULAR | Status: DC | PRN

## 2019-11-14 MED ORDER — OXYCODONE HCL 5 MG PO TABS
5.0000 mg | ORAL_TABLET | ORAL | Status: DC | PRN
  Administered 2019-11-15: 5 mg via ORAL
  Administered 2019-11-15 – 2019-11-16 (×2): 10 mg via ORAL
  Administered 2019-11-16 (×2): 5 mg via ORAL
  Administered 2019-11-17 – 2019-11-18 (×3): 10 mg via ORAL
  Filled 2019-11-14: qty 1
  Filled 2019-11-14 (×3): qty 2
  Filled 2019-11-14: qty 1
  Filled 2019-11-14 (×3): qty 2
  Filled 2019-11-14: qty 1

## 2019-11-14 MED ORDER — ONDANSETRON HCL 4 MG/2ML IJ SOLN
INTRAMUSCULAR | Status: AC
  Filled 2019-11-14: qty 2

## 2019-11-14 MED ORDER — CEFAZOLIN SODIUM-DEXTROSE 2-4 GM/100ML-% IV SOLN
2.0000 g | Freq: Three times a day (TID) | INTRAVENOUS | Status: AC
  Administered 2019-11-14 – 2019-11-15 (×2): 2 g via INTRAVENOUS
  Filled 2019-11-14 (×2): qty 100

## 2019-11-14 MED ORDER — CEFAZOLIN SODIUM-DEXTROSE 2-4 GM/100ML-% IV SOLN
2.0000 g | INTRAVENOUS | Status: AC
  Administered 2019-11-14 (×2): 2 g via INTRAVENOUS
  Filled 2019-11-14: qty 100

## 2019-11-14 MED ORDER — OXYCODONE HCL 5 MG/5ML PO SOLN: 5.0000 mg | Freq: Once | ORAL | Status: DC | PRN

## 2019-11-14 MED ORDER — MIDAZOLAM HCL 5 MG/5ML IJ SOLN
INTRAMUSCULAR | Status: DC | PRN
  Administered 2019-11-14 (×2): 1 mg via INTRAVENOUS

## 2019-11-14 MED ORDER — FENTANYL 50 MCG/ML IV PCA SOLN
INTRAVENOUS | Status: AC
  Filled 2019-11-14: qty 20

## 2019-11-14 MED ORDER — CEFAZOLIN SODIUM 1 G IJ SOLR
INTRAMUSCULAR | Status: AC
  Filled 2019-11-14: qty 20

## 2019-11-14 MED ORDER — 0.9 % SODIUM CHLORIDE (POUR BTL) OPTIME
TOPICAL | Status: DC | PRN
  Administered 2019-11-14: 2000 mL

## 2019-11-14 MED ORDER — DEXAMETHASONE SODIUM PHOSPHATE 10 MG/ML IJ SOLN
INTRAMUSCULAR | Status: DC | PRN
  Administered 2019-11-14: 10 mg via INTRAVENOUS

## 2019-11-14 MED ORDER — SODIUM CHLORIDE 0.9% FLUSH: 9.0000 mL | INTRAVENOUS | Status: DC | PRN

## 2019-11-14 MED ORDER — FENTANYL CITRATE (PF) 250 MCG/5ML IJ SOLN
INTRAMUSCULAR | Status: DC | PRN
  Administered 2019-11-14 (×6): 50 ug via INTRAVENOUS
  Administered 2019-11-14: 150 ug via INTRAVENOUS

## 2019-11-14 MED ORDER — ACETAMINOPHEN 500 MG PO TABS
1000.0000 mg | ORAL_TABLET | Freq: Once | ORAL | Status: AC
  Administered 2019-11-14: 1000 mg via ORAL
  Filled 2019-11-14: qty 2

## 2019-11-14 MED ORDER — MEPERIDINE HCL 25 MG/ML IJ SOLN: 6.2500 mg | INTRAMUSCULAR | Status: DC | PRN

## 2019-11-14 MED ORDER — ROCURONIUM BROMIDE 10 MG/ML (PF) SYRINGE
PREFILLED_SYRINGE | INTRAVENOUS | Status: AC
  Filled 2019-11-14: qty 10

## 2019-11-14 MED ORDER — INSULIN ASPART 100 UNIT/ML ~~LOC~~ SOLN
0.0000 [IU] | Freq: Three times a day (TID) | SUBCUTANEOUS | Status: DC
  Administered 2019-11-14: 2 [IU] via SUBCUTANEOUS

## 2019-11-14 MED ORDER — NALOXONE HCL 0.4 MG/ML IJ SOLN
0.4000 mg | INTRAMUSCULAR | Status: DC | PRN
  Filled 2019-11-14: qty 1

## 2019-11-14 MED ORDER — PHENYLEPHRINE 40 MCG/ML (10ML) SYRINGE FOR IV PUSH (FOR BLOOD PRESSURE SUPPORT)
PREFILLED_SYRINGE | INTRAVENOUS | Status: DC | PRN
  Administered 2019-11-14: 40 ug via INTRAVENOUS
  Administered 2019-11-14: 80 ug via INTRAVENOUS
  Administered 2019-11-14: 40 ug via INTRAVENOUS

## 2019-11-14 MED ORDER — FENTANYL CITRATE (PF) 250 MCG/5ML IJ SOLN
INTRAMUSCULAR | Status: AC
  Filled 2019-11-14: qty 5

## 2019-11-14 MED ORDER — DIPHENHYDRAMINE HCL 12.5 MG/5ML PO ELIX
12.5000 mg | ORAL_SOLUTION | Freq: Four times a day (QID) | ORAL | Status: DC | PRN
  Filled 2019-11-14: qty 5

## 2019-11-14 MED ORDER — SENNOSIDES-DOCUSATE SODIUM 8.6-50 MG PO TABS
1.0000 | ORAL_TABLET | Freq: Every day | ORAL | Status: DC
  Administered 2019-11-15 – 2019-11-17 (×3): 1 via ORAL
  Filled 2019-11-14 (×4): qty 1

## 2019-11-14 MED ORDER — ROCURONIUM BROMIDE 100 MG/10ML IV SOLN
INTRAVENOUS | Status: DC | PRN
  Administered 2019-11-14: 10 mg via INTRAVENOUS
  Administered 2019-11-14: 20 mg via INTRAVENOUS
  Administered 2019-11-14: 10 mg via INTRAVENOUS
  Administered 2019-11-14 (×2): 20 mg via INTRAVENOUS
  Administered 2019-11-14 (×2): 10 mg via INTRAVENOUS
  Administered 2019-11-14: 60 mg via INTRAVENOUS

## 2019-11-14 MED ORDER — LIDOCAINE 2% (20 MG/ML) 5 ML SYRINGE
INTRAMUSCULAR | Status: AC
  Filled 2019-11-14: qty 5

## 2019-11-14 SURGICAL SUPPLY — 120 items
ADH SKN CLS APL DERMABOND .7 (GAUZE/BANDAGES/DRESSINGS) ×2
APPLIER CLIP ROT 10 11.4 M/L (STAPLE)
APR CLP MED LRG 11.4X10 (STAPLE)
BAG SPEC RTRVL C1550 15 (MISCELLANEOUS) ×2
BAG TISS RTRVL C300 12X14 (MISCELLANEOUS) ×2
BLADE CLIPPER SURG (BLADE) IMPLANT
BLADE SURG SZ11 CARB STEEL (BLADE) ×3 IMPLANT
BNDG COHESIVE 6X5 TAN STRL LF (GAUZE/BANDAGES/DRESSINGS) IMPLANT
CANISTER SUCT 3000ML PPV (MISCELLANEOUS) ×6 IMPLANT
CANNULA REDUC XI 12-8 STAPL (CANNULA) ×2
CANNULA REDUCER 12-8 DVNC XI (CANNULA) ×4 IMPLANT
CATH THORACIC 28FR (CATHETERS) IMPLANT
CATH THORACIC 28FR RT ANG (CATHETERS) IMPLANT
CATH THORACIC 36FR (CATHETERS) IMPLANT
CATH THORACIC 36FR RT ANG (CATHETERS) IMPLANT
CLIP APPLIE ROT 10 11.4 M/L (STAPLE) IMPLANT
CLIP VESOCCLUDE MED 6/CT (CLIP) IMPLANT
CNTNR URN SCR LID CUP LEK RST (MISCELLANEOUS) ×28 IMPLANT
CONN ST 1/4X3/8  BEN (MISCELLANEOUS) ×1
CONN ST 1/4X3/8 BEN (MISCELLANEOUS) ×2 IMPLANT
CONN Y 3/8X3/8X3/8  BEN (MISCELLANEOUS)
CONN Y 3/8X3/8X3/8 BEN (MISCELLANEOUS) IMPLANT
CONT SPEC 4OZ STRL OR WHT (MISCELLANEOUS) ×42
DEFOGGER SCOPE WARMER CLEARIFY (MISCELLANEOUS) ×3 IMPLANT
DERMABOND ADVANCED (GAUZE/BANDAGES/DRESSINGS) ×1
DERMABOND ADVANCED .7 DNX12 (GAUZE/BANDAGES/DRESSINGS) ×2 IMPLANT
DRAIN CHANNEL 28F RND 3/8 FF (WOUND CARE) ×3 IMPLANT
DRAIN CHANNEL 32F RND 10.7 FF (WOUND CARE) IMPLANT
DRAPE ARM DVNC X/XI (DISPOSABLE) ×8 IMPLANT
DRAPE COLUMN DVNC XI (DISPOSABLE) ×2 IMPLANT
DRAPE CV SPLIT W-CLR ANES SCRN (DRAPES) ×3 IMPLANT
DRAPE DA VINCI XI ARM (DISPOSABLE) ×4
DRAPE DA VINCI XI COLUMN (DISPOSABLE) ×1
DRAPE INCISE IOBAN 66X45 STRL (DRAPES) IMPLANT
DRAPE ORTHO SPLIT 77X108 STRL (DRAPES) ×3
DRAPE SURG ORHT 6 SPLT 77X108 (DRAPES) ×2 IMPLANT
DRAPE WARM FLUID 44X44 (DRAPES) ×3 IMPLANT
ELECT BLADE 6.5 EXT (BLADE) IMPLANT
ELECT REM PT RETURN 9FT ADLT (ELECTROSURGICAL) ×3
ELECTRODE REM PT RTRN 9FT ADLT (ELECTROSURGICAL) ×2 IMPLANT
GAUZE KITTNER 4X5 RF (MISCELLANEOUS) ×21 IMPLANT
GAUZE SPONGE 4X4 12PLY STRL (GAUZE/BANDAGES/DRESSINGS) ×3 IMPLANT
GAUZE SPONGE 4X4 12PLY STRL LF (GAUZE/BANDAGES/DRESSINGS) ×3 IMPLANT
GLOVE SS N UNI LF 6.0 STRL (GLOVE) ×3 IMPLANT
GLOVE TRIUMPH SURG SIZE 7.5 (KITS) ×6 IMPLANT
GOWN STRL REUS W/ TWL LRG LVL3 (GOWN DISPOSABLE) ×4 IMPLANT
GOWN STRL REUS W/ TWL XL LVL3 (GOWN DISPOSABLE) ×6 IMPLANT
GOWN STRL REUS W/TWL 2XL LVL3 (GOWN DISPOSABLE) ×6 IMPLANT
GOWN STRL REUS W/TWL LRG LVL3 (GOWN DISPOSABLE) ×6
GOWN STRL REUS W/TWL XL LVL3 (GOWN DISPOSABLE) ×9
HEMOSTAT SURGICEL 2X14 (HEMOSTASIS) ×9 IMPLANT
IRRIGATION STRYKERFLOW (MISCELLANEOUS) ×2 IMPLANT
IRRIGATOR STRYKERFLOW (MISCELLANEOUS) ×3
KIT BASIN OR (CUSTOM PROCEDURE TRAY) ×3 IMPLANT
KIT SUCTION CATH 14FR (SUCTIONS) IMPLANT
KIT TURNOVER KIT B (KITS) ×3 IMPLANT
LOOP VESSEL SUPERMAXI WHITE (MISCELLANEOUS) IMPLANT
NEEDLE HYPO 25GX1X1/2 BEV (NEEDLE) ×3 IMPLANT
NEEDLE SPNL 22GX3.5 QUINCKE BK (NEEDLE) ×3 IMPLANT
NS IRRIG 1000ML POUR BTL (IV SOLUTION) ×6 IMPLANT
PACK CHEST (CUSTOM PROCEDURE TRAY) ×3 IMPLANT
PAD ARMBOARD 7.5X6 YLW CONV (MISCELLANEOUS) ×6 IMPLANT
PORT ACCESS TROCAR AIRSEAL 12 (TROCAR) ×2 IMPLANT
PORT ACCESS TROCAR AIRSEAL 5M (TROCAR) ×1
RELOAD STAPLER 2.5X45 WHT DVNC (STAPLE) ×6 IMPLANT
RELOAD STAPLER 4.3X45 GRN DVNC (STAPLE) ×2 IMPLANT
SCISSORS LAP 5X35 DISP (ENDOMECHANICALS) IMPLANT
SEAL CANN UNIV 5-8 DVNC XI (MISCELLANEOUS) ×4 IMPLANT
SEAL XI 5MM-8MM UNIVERSAL (MISCELLANEOUS) ×2
SEALANT PROGEL (MISCELLANEOUS) IMPLANT
SEALANT SURG COSEAL 4ML (VASCULAR PRODUCTS) IMPLANT
SEALANT SURG COSEAL 8ML (VASCULAR PRODUCTS) IMPLANT
SET TRI-LUMEN FLTR TB AIRSEAL (TUBING) ×3 IMPLANT
SET TUBE SMOKE EVAC HIGH FLOW (TUBING) ×3 IMPLANT
SHEARS HARMONIC HDI 20CM (ELECTROSURGICAL) IMPLANT
SOLUTION ELECTROLUBE (MISCELLANEOUS) IMPLANT
SPECIMEN JAR MEDIUM (MISCELLANEOUS) IMPLANT
SPONGE INTESTINAL PEANUT (DISPOSABLE) IMPLANT
SPONGE TONSIL TAPE 1 RFD (DISPOSABLE) IMPLANT
STAPLER 45 BLU RELOAD XI (STAPLE) ×18 IMPLANT
STAPLER 45 BLUE RELOAD XI (STAPLE) ×9
STAPLER CANNULA SEAL DVNC XI (STAPLE) ×4 IMPLANT
STAPLER CANNULA SEAL XI (STAPLE) ×2
STAPLER RELOAD 2.5X45 WHITE (STAPLE) ×3
STAPLER RELOAD 2.5X45 WHT DVNC (STAPLE) ×6
STAPLER RELOAD 4.3X45 GREEN (STAPLE) ×1
STAPLER RELOAD 4.3X45 GRN DVNC (STAPLE) ×2
SUT PDS AB 3-0 SH 27 (SUTURE) IMPLANT
SUT PROLENE 4 0 RB 1 (SUTURE)
SUT PROLENE 4-0 RB1 .5 CRCL 36 (SUTURE) IMPLANT
SUT SILK  1 MH (SUTURE) ×1
SUT SILK 1 MH (SUTURE) ×2 IMPLANT
SUT SILK 1 TIES 10X30 (SUTURE) ×3 IMPLANT
SUT SILK 2 0 SH (SUTURE) ×3 IMPLANT
SUT SILK 2 0SH CR/8 30 (SUTURE) IMPLANT
SUT SILK 3 0 SH 30 (SUTURE) IMPLANT
SUT SILK 3 0SH CR/8 30 (SUTURE) IMPLANT
SUT VIC AB 1 CTX 36 (SUTURE)
SUT VIC AB 1 CTX36XBRD ANBCTR (SUTURE) IMPLANT
SUT VIC AB 2-0 CTX 36 (SUTURE) IMPLANT
SUT VIC AB 3-0 MH 27 (SUTURE) IMPLANT
SUT VIC AB 3-0 X1 27 (SUTURE) ×6 IMPLANT
SUT VICRYL 0 TIES 12 18 (SUTURE) ×3 IMPLANT
SUT VICRYL 0 UR6 27IN ABS (SUTURE) ×12 IMPLANT
SUT VICRYL 2 TP 1 (SUTURE) IMPLANT
SYR 20ML ECCENTRIC (SYRINGE) ×3 IMPLANT
SYR 30ML LL (SYRINGE) ×3 IMPLANT
SYR TOOMEY IRRIG 70ML (MISCELLANEOUS) ×3
SYRINGE TOOMEY IRRIG 70ML (MISCELLANEOUS) ×2 IMPLANT
SYSTEM RETRIEVAL ANCHOR 12 (MISCELLANEOUS) ×3 IMPLANT
SYSTEM RETRIEVAL ANCHOR 15 (MISCELLANEOUS) ×3 IMPLANT
SYSTEM SAHARA CHEST DRAIN ATS (WOUND CARE) ×3 IMPLANT
TAPE CLOTH 4X10 WHT NS (GAUZE/BANDAGES/DRESSINGS) ×3 IMPLANT
TAPE CLOTH SURG 4X10 WHT LF (GAUZE/BANDAGES/DRESSINGS) ×3 IMPLANT
TIP APPLICATOR SPRAY EXTEND 16 (VASCULAR PRODUCTS) IMPLANT
TOWEL GREEN STERILE (TOWEL DISPOSABLE) ×3 IMPLANT
TRAY FOLEY MTR SLVR 16FR STAT (SET/KITS/TRAYS/PACK) ×3 IMPLANT
TROCAR BLADELESS 15MM (ENDOMECHANICALS) IMPLANT
TROCAR XCEL 12X100 BLDLESS (ENDOMECHANICALS) ×3 IMPLANT
WATER STERILE IRR 1000ML POUR (IV SOLUTION) ×3 IMPLANT

## 2019-11-14 NOTE — Hospital Course (Signed)
HPI:   Eduardo Moore is a 39 year old Hispanic man with a history of allergies and wheezing.  He is a lifelong non-smoker.  He works Cytogeneticist.  Last spring he developed worsening wheezing and then later on hemoptysis as well.  He was found to have a left lower lobe mass as well as other cavitary areas in the left lower lobe on CT.   He saw Dr.Ramaswamy.  A QuantiFERON gold test was negative. On PET CT there was uptake with an SUV of 3.3.    He was scheduled for a robotic surgical resection but then opted not to have surgery.  He finally agreed to a bronchoscopic biopsy.  That was done on 10/02/2019.  Biopsy showed aspergilloma.   At one point he was going to move to [place] but now has decided not to move.  He was advised to have surgical resection because of the risk of life-threatening hemoptysis.  He was very reluctant to consider surgery.  He had an appointment with infectious disease, but missed that due to some abdominal discomfort.  He was seen in the emergency room for that.  A CT of the abdomen and pelvis did not show any source for his discomfort.    Hospital course:   On 11/14/2019 Eduardo Moore underwent a roboti-assisted left lower lobectomy with Dr. [name]. He tolerated the procedure well and was transferred to the PACU in stable condition.

## 2019-11-14 NOTE — Brief Op Note (Addendum)
      [address]       [name] [address]             [phone]     11/14/2019  12:44 PM  PATIENT:  Eduardo Moore  39 y.o. male  PRE-OPERATIVE DIAGNOSIS:  ASPERGILLOMA LEFT LOWER LOBE HEMOPTYSIS  POST-OPERATIVE DIAGNOSIS:  ASPERGILLOMA LEFT LOWER LOBE  PROCEDURE:  Procedure(s): XI ROBOTIC ASSISTED THORASCOPY-LEFT LOWER LOBECTOMY (Left) LYMPH NODE DISSECTION (N/A) INTERCOSTAL NERVE BLOCK (Left)  SURGEON:  Surgeon(s) and Role:    * Loreli Slot, MD - Primary  PHYSICIAN ASSISTANT:   Jari Favre, PA-C   ANESTHESIA:   general  EBL:  300 mL   BLOOD ADMINISTERED:none  DRAINS: ONE BLAKE CHEST TUBE   LOCAL MEDICATIONS USED:  BUPIVICAINE   SPECIMEN:  Source of Specimen:  LEFT LOWER LOBE  DISPOSITION OF SPECIMEN:  PATHOLOGY  COUNTS:  YES  DICTATION: .Dragon Dictation  PLAN OF CARE: Admit to inpatient   PATIENT DISPOSITION:  PACU - hemodynamically stable.   Delay start of Pharmacological VTE agent (>24hrs) due to surgical blood loss or risk of bleeding: yes  FINDINGS: severe adhesions

## 2019-11-14 NOTE — Anesthesia Postprocedure Evaluation (Signed)
Anesthesia Post Note  Patient: Ihan Rosales Nolasco  Procedure(s) Performed: XI ROBOTIC ASSISTED THORASCOPY-LEFT LOWER LOBECTOMY (Left Chest) LYMPH NODE DISSECTION (N/A Chest) INTERCOSTAL NERVE BLOCK (Left Chest)     Patient location during evaluation: PACU Anesthesia Type: General Level of consciousness: awake and alert Pain management: pain level controlled Vital Signs Assessment: post-procedure vital signs reviewed and stable Respiratory status: spontaneous breathing, nonlabored ventilation, respiratory function stable and patient connected to nasal cannula oxygen Cardiovascular status: blood pressure returned to baseline and stable Postop Assessment: no apparent nausea or vomiting Anesthetic complications: no   No complications documented.  Last Vitals:  Vitals:   11/14/19 1345 11/14/19 1400  BP: 134/86 137/83  Pulse: 77 72  Resp: 12 (!) 9  Temp:    SpO2: 100% 100%    Last Pain:  Vitals:   11/14/19 1400  TempSrc:   PainSc: Asleep                 Ermine Stebbins

## 2019-11-14 NOTE — Anesthesia Procedure Notes (Signed)
Procedure Name: Intubation Date/Time: 11/14/2019 7:55 AM Performed by: Janene Harvey, CRNA Pre-anesthesia Checklist: Patient identified, Emergency Drugs available, Suction available and Patient being monitored Patient Re-evaluated:Patient Re-evaluated prior to induction Oxygen Delivery Method: Circle system utilized Preoxygenation: Pre-oxygenation with 100% oxygen Induction Type: IV induction Ventilation: Mask ventilation without difficulty Laryngoscope Size: Mac and 4 Grade View: Grade I Tube type: Oral Endobronchial tube: Left, Double lumen EBT, EBT position confirmed by auscultation and EBT position confirmed by fiberoptic bronchoscope and 37 Fr Number of attempts: 1 Airway Equipment and Method: Stylet and Oral airway Placement Confirmation: ETT inserted through vocal cords under direct vision,  positive ETCO2 and breath sounds checked- equal and bilateral Secured at: 29 cm Tube secured with: Tape Dental Injury: Teeth and Oropharynx as per pre-operative assessment

## 2019-11-14 NOTE — Interval H&P Note (Signed)
History and Physical Interval Note:  11/14/2019 7:21 AM  Eduardo Moore  has presented today for surgery, with the diagnosis of ASPERGILLOMA LEFT UPPER LOBE HEMOPTYSIS.  The various methods of treatment have been discussed with the patient and family. After consideration of risks, benefits and other options for treatment, the patient has consented to  Procedure(s): XI ROBOTIC ASSISTED THORASCOPY-LEFT LOWER LOBECTOMY (Left) as a surgical intervention.  The patient's history has been reviewed, patient examined, no change in status, stable for surgery.  I have reviewed the patient's chart and labs.  Questions were answered to the patient's satisfaction.     Loreli Slot

## 2019-11-14 NOTE — Discharge Instructions (Signed)
Ciruga torcica asistida por robot, Transport planner Surgery, Care After Lea esta informacin sobre cmo cuidarse despus del procedimiento. Su mdico tambin podr darle indicaciones ms especficas. Comunquese con su mdico si tiene problemas o preguntas. Qu puedo esperar despus del procedimiento? Despus del procedimiento, es comn DIRECTV siguientes sntomas:  Algo de dolor y Associate Professor en el rea de los cortes quirrgicos (incisiones).  Dolor al Soil scientist) y toser.  Cansancio (fatiga).  Dificultad para dormir.  Estreimiento. Siga estas indicaciones en su casa: Medicamentos  [employer] de venta libre y los recetados solamente como se lo haya indicado el mdico.  Si le recetaron un antibitico, tmelo como se lo haya indicado el mdico. No deje de tomar los antibiticos aunque comience a Actor.  Hable con su mdico sobre mtodos seguros y eficaces de Chief Operating Officer el dolor despus del procedimiento. El control del dolor debe adecuarse a sus necesidades de Systems analyst.  Tome los analgsicos recetados antes de que el dolor se torne intenso. El Cecil y el control del dolor facilitarn la respiracin. Actividad  Reanude sus actividades normales segn lo indicado por el mdico. Pregntele al mdico qu actividades son seguras para usted.  No levante ningn objeto que pese ms de 10libras (4,5kg) o el lmite de peso que le indiquen hasta que el mdico le diga que puede [place].  Evite estar sentado durante largos perodos sin moverse. Levntese y Mattel o ms veces en intervalos de unas pocas horas. Baarse  No tome baos de inmersin, no nade ni use el jacuzzi hasta que el mdico lo autorice. Puede ducharse. Cuidados de la incisin  Siga las indicaciones del mdico acerca del cuidado de las incisiones. Haga lo siguiente: ? Lvese las manos con agua y jabn antes de Multimedia programmer las vendas (vendajes). Use  desinfectante para manos si no dispone de [place] y [place]. ? Cambie el vendaje como se lo haya indicado el mdico. ? No retire los puntos (suturas), la goma para cerrar la piel o las tiras [place]. Es posible que estos cierres cutneos Conservation officer, nature en la piel durante 2semanas o ms tiempo. Si los bordes de las tiras [address] empiezan a despegarse y Scientific laboratory technician, puede recortar los que estn sueltos. No retire las tiras [employer] por completo a menos que el mdico se lo indique.  Controle todos los das la zona de la incisin para detectar signos de infeccin. Est atento a los siguientes signos: ? Dolor, hinchazn o enrojecimiento. ? Lquido o sangre. ? Calor. ? Pus o mal olor. Conducir  Pregntele al mdico cundo es seguro volver a Science writer.  No conduzca ni use maquinaria pesada mientras toma analgsicos recetados. Comida y bebida  Siga las indicaciones del mdico respecto de las restricciones de comidas o bebidas. Las indicaciones variarn en funcin del procedimiento que le hayan realizado. El mdico puede recomendarle lo siguiente: ? [place] dieta lquida o una dieta blanda para los [address] ? Comidas ms pequeas y ms frecuentes. ? Una dieta de frutas, verduras, cereales integrales y protenas con bajo contenido de [place] ? Restriccin en el consumo de alimentos ricos en azcares procesados y grasas, incluidos los alimentos fritos y dulces. Prevencin de la neumona   No consuma ningn producto que contenga nicotina o tabaco, como cigarrillos y Administrator, Civil Service. Si necesita ayuda para dejar de fumar, consulte al mdico.  Evite ser fumador pasivo.  Haga ejercicios de respiracin profunda y tosa con regularidad como le hayan indicado. Esto ayuda a eliminar la mucosidad y  prevenir la neumona. Si siente dolor al toser, pruebe alguno de estos mtodos para Acupuncturist dolor: ? Sostenga una almohada contra su pecho. ? Coloque las palmas de las [employer] incisiones (a  modo de entablillado).  Use el espirmetro de incentivo segn las indicaciones. Este dispositivo mide la cantidad de aire que reciben los pulmones cada vez que inhala. Su uso mejorar la respiracin.  Haga la rehabilitacin pulmonar como se lo indiquen. Este es un programa que incluye ejercicio, educacin y apoyo. Instrucciones generales  Use las medias de compresin como se lo haya indicado el mdico. Estas medias ayudan a evitar la formacin de cogulos de [place] y a reducir la hinchazn de las piernas.  Si tiene un tubo de drenaje: ? Siga las indicaciones del mdico acerca de cmo cuidarlo. ? No viaje en avin despus de que se lo saquen hasta que el [employer] diga que es seguro.  A fin de prevenir o tratar el estreimiento mientras toma analgsicos recetados, el mdico puede recomendarle lo siguiente: ? Beber suficiente lquido para mantener la orina de color amarillo plido. ? Tomar medicamentos recetados o de [employer]. ? Consumir alimentos ricos en fibra, como frutas y verduras frescas, cereales integrales y frijoles. ? Limitar el consumo de alimentos ricos en grasas y azcares procesados, como alimentos fritos o dulces.  Concurra a todas las visitas de control como se lo haya indicado el mdico. Esto es importante. Comunquese con un mdico si:  Nota enrojecimiento o hinchazn, o siente dolor alrededor de una incisin.  Le sale lquido o sangre de una incisin.  La incisin est caliente al tacto.  Tiene pus o percibe que sale mal olor del lugar de la incisin.  Tiene fiebre.  No puede comer ni beber sin vomitar.  No puede controlar el dolor con los analgsicos recetados. Solicite ayuda de inmediato si:  Midwife.  Su corazn late rpidamente.  Tiene dificultad para respirar.  Tiene dificultad para hablar.  Se siente confundido.  Se siente mareado, dbil o se desmaya. Estos sntomas pueden representar un problema grave que constituye Research scientist (physical sciences). No espere hasta que los sntomas desaparezcan. Solicite atencin mdica de inmediato. Comunquese con el servicio de emergencias de su localidad (911 en los [place]). No conduzca por sus propios medios [employer] [facility]. Resumen  Hable con su mdico sobre mtodos seguros y eficaces de controlar el dolor despus del procedimiento. El control del dolor debe adecuarse a sus necesidades de Systems analyst.  Reanude sus actividades normales segn lo indicado por el mdico. Pregntele al mdico qu actividades son seguras para usted.  Haga ejercicios de respiracin profunda y tosa con regularidad como le hayan indicado. Esto ayuda a eliminar la mucosidad y prevenir la neumona. Si siente dolor al toser, trate de aliviarlo sosteniendo una almohada contra el pecho o colocando las palmas de las manos sobre las incisiones (a modo de entablillado). Esta informacin no tiene Theme park manager el consejo del mdico. Asegrese de hacerle al mdico cualquier pregunta que tenga. Document Revised: 11/20/2016 Document Reviewed: 11/20/2016 Elsevier Patient Education  2020 Elsevier Inc.  ble to breathe and cough more comfortably.  Ask your health care provider what activities are safe for you. This information is not intended to replace advice given to you by your health care provider. Make sure you discuss any questions you have with your health care provider. Document Revised: 02/09/2017 Document Reviewed: 02/07/2016 Elsevier Patient Education  2020 [employer].

## 2019-11-14 NOTE — Op Note (Signed)
Eduardo, Moore MEDICAL RECORD WN:[id number] ACCOUNT [id number] DATE OF BIRTH:May 25, 1980 FACILITY: [facility] LOCATION: [facility] PHYSICIAN:Atalia Litzinger Lars Pinks, MD  OPERATIVE REPORT  DATE OF PROCEDURE:  11/14/2019  PREOPERATIVE DIAGNOSIS:  Aspergilloma left lower lobe with hemoptysis.  POSTOPERATIVE DIAGNOSIS:  Aspergilloma left lower lobe with hemoptysis.  PROCEDURE:   Xi robotic video-assisted left thoracoscopy Lysis of adhesions, Left lower lobectomy, Lymph node dissection, and Intercostal nerve blocks levels 3 through 10.  SURGEON:  Charlett Lango, MD  ASSISTANT:  Jari Favre, PA-C  ANESTHESIA:  General.  FINDINGS:  Severe adhesions throughout the pleural space.  Mass clearly visible in superior segment.  CLINICAL NOTE:  [name] is a 39 year old man who presented with cough, wheezing and hemoptysis.  He was found to have a mass in the superior segment of the left lower lobe.  He originally was scheduled for surgery, but decided instead he would like  to have a biopsy done.  That was done with navigational bronchoscopy and showed an aspergilloma.  Given the size of the mass and his hemoptysis, he was advised to undergo surgical resection.  In addition to the primary aspergilloma, there were multiple  other cavitary lesions in the left lower lobe.  He was advised to have a lower lobectomy for definitive treatment.  The indications, risks, benefits, and alternatives were discussed in detail with the patient.  He understood and accepted the risks and  agreed to proceed.  OPERATIVE NOTE:  [name] was brought to the preoperative holding area on 11/14/2019.  Anesthesia placed an arterial blood pressure monitoring line and established central venous access.  He was taken to the operating room, anesthetized and intubated  with a double lumen endotracheal tube.  Intravenous antibiotics were administered.  Sequential compression devices were placed on  the calves for DVT prophylaxis.  A Foley catheter was placed.  He was placed in a right lateral decubitus position and the  left chest was prepped and draped in the usual sterile fashion.  Active warming was in place.  Single-lung ventilation of the right lung was initiated and was tolerated well throughout the procedure.  The left chest was prepped and draped in the usual  sterile fashion.  A timeout was performed.  A solution containing 20 mL of liposomal bupivacaine, 30 mL of 0.5% bupivacaine and 50 mL of saline was prepared.  This was used for local at the incision sites, as well as for the intercostal nerve blocks.  An incision was made  in the 8th interspace in the midaxillary line.  An 8 mm port was inserted.  The thoracoscope was advanced into the chest.  It was an intrapleural placement.  There were extensive adhesions in the pleural space.  Carbon dioxide was insufflated to a  pressure of 10 mmHg.  Twelve mm ports were placed anterior and posterior to the camera port and then in the 9th interspace more posteriorly, a retraction port was placed.  A 12 mm assistant's port was placed in the 10th interspace centered between the 2  anterior ports.  The robotic instruments were inserted with thoracoscopic visualization.  A monopolar spatula was used to lyse the adhesions.  These were thin, filmy adhesions between the visceral and parietal pleura.  There also were adhesions to the  mediastinum and to the diaphragm inferiorly.  Adhesions were taken down laterally and up to the apex, then the anterior and posterior adhesions were taken down, and finally the diaphragmatic adhesions were taken down last.  The inferior  ligament was  divided with bipolar cautery.  All lymph nodes that were encountered during the dissection were sent as separate specimens.  The lymph nodes were primarily dissected out to expose the airways and vessels.  The pleural reflection was divided at the hilum  posteriorly and then  anteriorly.  Posteriorly over the pulmonary artery, there was a markedly enlarged lymph node.  This was sent both for pathology as well as for fungal cultures.  The fissure was incomplete.  The robotic stapler with a blue cartridge  was used to divide the anterior portion of the fissure.  The bronchus was identified and then the pulmonary artery.  Dissection was carried out superior to the pulmonary artery and the remainder of the fissure was completed with a firing of the robotic  stapler.  The inferior vein was encircled and divided with the vascular cartridge on the robotic stapler.  Next, the bronchus was taken.  A green cartridge was used with the stapler.  It was placed across the base of the left lower lobe bronchus and  closed.  A test inflation showed good aeration of the upper lobe.  The stapler was fired, transecting the bronchus.  The basilar and superior segmental arterial branches to the lower lobe then were divided separately, again using vascular cartridges on  the stapler.  The specimen was placed into an endoscopic retrieval bag and brought down to the inferior portion of the chest.  The chest was copiously irrigated with warm saline.  A test inflation to 30 cm of water revealed no leakage from the staple  lines or the bronchial stump.  The robot was undocked.  The anterior 8th interspace incision was lengthened and the specimen was removed.  This was sent for permanent pathology.  Intercostal nerve blocks and were performed from the 3rd to the 10th  interspace.  A needle was inserted from a posterior approach into a subpleural plane and 10 mL of the bupivacaine solution was injected at each level.  A 28-French Blake drain was placed through the original midaxillary line port incision.  It was  secured with a #1 silk suture.  The chest was again copiously irrigated with saline.  There was no ongoing bleeding.  The scope was removed.  The remaining incisions were closed in standard fashion.   Dermabond was applied.  The chest tube was placed to  Pleur-evac on waterseal.  The patient then was extubated in the operating room and taken to the Postanesthetic Care Unit in good condition.  All sponge, needle and instrument counts were correct.  VN/NUANCE  D:11/14/2019 T:11/14/2019 JOB:[id number]

## 2019-11-14 NOTE — Transfer of Care (Signed)
Immediate Anesthesia Transfer of Care Note  Patient: Daschel Rosales Nolasco  Procedure(s) Performed: XI ROBOTIC ASSISTED THORASCOPY-LEFT LOWER LOBECTOMY (Left Chest) LYMPH NODE DISSECTION (N/A Chest) INTERCOSTAL NERVE BLOCK (Left Chest)  Patient Location: PACU  Anesthesia Type:General  Level of Consciousness: drowsy  Airway & Oxygen Therapy: Patient Spontanous Breathing and Patient connected to face mask oxygen  Post-op Assessment: Report given to RN and Post -op Vital signs reviewed and stable  Post vital signs: Reviewed  Last Vitals:  Vitals Value Taken Time  BP 139/92 11/14/19 1301  Temp    Pulse 88 11/14/19 1311  Resp 14 11/14/19 1311  SpO2 98 % 11/14/19 1311  Vitals shown include unvalidated device data.  Last Pain:  Vitals:   11/14/19 0625  TempSrc:   PainSc: 0-No pain      Patients Stated Pain Goal: 3 (11/14/19 3435)  Complications: No complications documented.

## 2019-11-14 NOTE — Anesthesia Procedure Notes (Signed)
Arterial Line Insertion Start/End9/05/2019 7:00 AM, 11/14/2019 7:08 AM Performed by: Audie Pinto, CRNA  Patient location: Pre-op. Preanesthetic checklist: patient identified, IV checked and risks and benefits discussed Lidocaine 1% used for infiltration and patient sedated Right, radial was placed Catheter size: 20 G Hand hygiene performed  and maximum sterile barriers used  Allen's test indicative of satisfactory collateral circulation Attempts: 2 Procedure performed without using ultrasound guided technique. Following insertion, dressing applied and Biopatch. Patient tolerated the procedure well with no immediate complications.

## 2019-11-15 ENCOUNTER — Encounter (HOSPITAL_COMMUNITY): Payer: Self-pay | Admitting: Thoracic Surgery (Cardiothoracic Vascular Surgery)

## 2019-11-15 ENCOUNTER — Inpatient Hospital Stay (HOSPITAL_COMMUNITY): Payer: Self-pay

## 2019-11-15 DIAGNOSIS — B441 Other pulmonary aspergillosis: Principal | ICD-10-CM

## 2019-11-15 DIAGNOSIS — Z9889 Other specified postprocedural states: Secondary | ICD-10-CM

## 2019-11-15 LAB — BLOOD GAS, ARTERIAL
Acid-Base Excess: 2 mmol/L (ref 0.0–2.0)
Bicarbonate: 26.1 mmol/L (ref 20.0–28.0)
Drawn by: 58062
FIO2: 21
O2 Saturation: 97.4 %
Patient temperature: 36.8
pCO2 arterial: 41.1 mmHg (ref 32.0–48.0)
pH, Arterial: 7.419 (ref 7.350–7.450)
pO2, Arterial: 92.3 mmHg (ref 83.0–108.0)

## 2019-11-15 LAB — ACID FAST SMEAR (AFB, MYCOBACTERIA): Acid Fast Smear: NEGATIVE

## 2019-11-15 LAB — BASIC METABOLIC PANEL
Anion gap: 10 (ref 5–15)
BUN: 10 mg/dL (ref 6–20)
CO2: 23 mmol/L (ref 22–32)
Calcium: 9 mg/dL (ref 8.9–10.3)
Chloride: 101 mmol/L (ref 98–111)
Creatinine, Ser: 0.89 mg/dL (ref 0.61–1.24)
GFR calc Af Amer: >60 mL/min (ref >60)
GFR calc non Af Amer: >60 mL/min (ref >60)
Glucose, Bld: 133 mg/dL — ABNORMAL HIGH (ref 70–99)
Potassium: 4.2 mmol/L (ref 3.5–5.1)
Sodium: 134 mmol/L — ABNORMAL LOW (ref 135–145)

## 2019-11-15 LAB — GLUCOSE, CAPILLARY
Glucose-Capillary: 116 mg/dL — ABNORMAL HIGH (ref 70–99)
Glucose-Capillary: 100 mg/dL — ABNORMAL HIGH (ref 70–99)
Glucose-Capillary: 111 mg/dL — ABNORMAL HIGH (ref 70–99)
Glucose-Capillary: 106 mg/dL — ABNORMAL HIGH (ref 70–99)

## 2019-11-15 LAB — ACID FAST CULTURE WITH REFLEXED SENSITIVITIES (MYCOBACTERIA)
Acid Fast Culture: NEGATIVE
Acid Fast Culture: NEGATIVE

## 2019-11-15 LAB — CBC
HCT: 38.2 % — ABNORMAL LOW (ref 39.0–52.0)
Hemoglobin: 12.7 g/dL — ABNORMAL LOW (ref 13.0–17.0)
MCH: 29.8 pg (ref 26.0–34.0)
MCHC: 33.2 g/dL (ref 30.0–36.0)
MCV: 89.7 fL (ref 80.0–100.0)
Platelets: 157 10*3/uL (ref 150–400)
RBC: 4.26 MIL/uL (ref 4.22–5.81)
RDW: 12.2 % (ref 11.5–15.5)
WBC: 14.7 10*3/uL — ABNORMAL HIGH (ref 4.0–10.5)
nRBC: 0 % (ref 0.0–0.2)

## 2019-11-15 NOTE — Consult Note (Signed)
[facility]       Reason for Consult: aspergilloma    Referring Physician: Dr. [name]  Active Problems:   Status post robot-assisted surgical procedure   S/P robot-assisted surgical procedure   . acetaminophen  1,000 mg Oral Q6H   Or  . acetaminophen (TYLENOL) oral liquid 160 mg/5 mL  1,000 mg Oral Q6H  . bisacodyl  10 mg Oral Daily  . Chlorhexidine Gluconate Cloth  6 each Topical Daily  . enoxaparin (LOVENOX) injection  40 mg Subcutaneous Daily  . fentaNYL   Intravenous Q4H  . fluticasone furoate-vilanterol  1 puff Inhalation Daily  . insulin aspart  0-24 Units Subcutaneous TID AC & HS  . ketorolac  15 mg Intravenous Q6H  . 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  I discussed with the patient at length the surgery and indications for continued antibiotics and no current indication for antibiotic treatment.  Reassurance provided.   Discussed with Dr. [name] Call with any questions or concerns. thanks  Antibiotics: Perioperative cefazolin  HPI: Eduardo Moore is a 39 y.o. male with a history of allergies and wheezing with work as an Engineer, building services developed wheezing and hemoptysis and found a left lobe mass with cavitary areas on CT.  PET scan with some uptake and Dr. [name] did a biopsy and bronch and culture and path staining c/w aspergilloma.  Due to hemoptysis, he was to undergo resection but delayed intervention as he was reluctant to undergo surgery.  He though underwent successful resection yesterday by Dr. [name] with no spillage or concerns intraoperatively.    Review of Systems:  Constitutional: negative for fevers and chills Gastrointestinal: negative for nausea and diarrhea Behavioral/Psych:  positive for anxiety All other systems reviewed and are negative    Past Medical History:  Diagnosis Date  . Anxiety   . Depression   . Dyspnea    when walking  . GERD (gastroesophageal reflux disease)     Social History   Tobacco Use  . Smoking status: Never Smoker  . Smokeless tobacco: Never Used  Vaping Use  . Vaping Use: Never used  Substance Use Topics  . Alcohol use: Yes    Comment: occasional  . Drug use: Never    Family History  Problem Relation Age of Onset  . Asthma Mother     Allergies  Allergen Reactions  . Lentil Shortness Of Breath and Rash    lentils  . Pea Shortness Of Breath and Rash    Green Peas    Physical Exam: Constitutional: in no apparent distress  Vitals:   11/15/19 0828 11/15/19 0852  BP:    Pulse:    Resp: 20   Temp:    SpO2: 94% 97%   EYES: anicteric Cardiovascular: Cor RRR Respiratory: clear; Musculoskeletal: no pedal edema noted Skin: negatives: no rash Neuro: non-focal  Lab Results  Component Value Date   WBC 14.7 (H) 11/15/2019   HGB 12.7 (L) 11/15/2019   HCT 38.2 (L) 11/15/2019   MCV 89.7 11/15/2019   PLT 157 11/15/2019    Lab Results  Component Value Date   CREATININE 0.89 11/15/2019   BUN 10 11/15/2019   NA 134 (L) 11/15/2019   K 4.2 11/15/2019   CL 101 11/15/2019   CO2 23 11/15/2019  Lab Results  Component Value Date   ALT 34 11/12/2019   AST 16 11/12/2019   ALKPHOS 68 11/12/2019     Microbiology: Recent Results (from the past 240 hour(s))  Surgical pcr screen     Status: Abnormal   Collection Time: 11/12/19  9:56 AM   Specimen: Nasal Mucosa; Nasal Swab  Result Value Ref Range Status   MRSA, PCR POSITIVE (A) NEGATIVE Final   Staphylococcus aureus POSITIVE (A) NEGATIVE Final    Comment: (NOTE) The Xpert SA Assay (FDA approved for NASAL specimens in patients 25 years of age and older), is one component of a comprehensive surveillance program. It is not intended to diagnose infection nor  to guide or monitor treatment. Performed at [facility], [address] [place], [place] [address]   SARS CORONAVIRUS 2 (TAT 6-24 HRS) Nasopharyngeal Nasopharyngeal Swab     Status: None   Collection Time: 11/12/19 10:57 AM   Specimen: Nasopharyngeal Swab  Result Value Ref Range Status   SARS Coronavirus 2 NEGATIVE NEGATIVE Final    Comment: (NOTE) SARS-CoV-2 target nucleic acids are NOT DETECTED.  The SARS-CoV-2 RNA is generally detectable in upper and lower respiratory specimens during the acute phase of infection. Negative results do not preclude SARS-CoV-2 infection, do not rule out co-infections with other pathogens, and should not be used as the sole basis for treatment or other patient management decisions. Negative results must be combined with clinical observations, patient history, and epidemiological information. The expected result is Negative.  Fact Sheet for Patients: HairSlick.no  Fact Sheet for Healthcare Providers: quierodirigir.com  This test is not yet approved or cleared by the [place] FDA and  has been authorized for detection and/or diagnosis of SARS-CoV-2 by FDA under an Emergency Use Authorization (EUA). This EUA will remain  in effect (meaning this test can be used) for the duration of the COVID-19 declaration under Se ction 564(b)(1) of the Act, 21 U.S.C. section 360bbb-3(b)(1), unless the authorization is terminated or revoked sooner.  Performed at [facility], [address] [place], [place] [address]   Aerobic/Anaerobic Culture (surgical/deep wound)     Status: None (Preliminary result)   Collection Time: 11/14/19 10:00 AM   Specimen: Lymph Node  Result Value Ref Range Status   Specimen Description TISSUE LYMPH NODE SPEC 1  Final   Special Requests PATIENT ON FOLLOWING ANCEF  Final   Gram Stain   Final    ABUNDANT WBC PRESENT, PREDOMINANTLY MONONUCLEAR NO ORGANISMS  SEEN Performed at [facility], [address], [place], [place] [address]    Culture PENDING  Incomplete   Report Status PENDING  Incomplete    Gardiner Barefoot, MD [facility] [email or web address] 11/15/2019, 11:16 AM

## 2019-11-15 NOTE — Progress Notes (Addendum)
[address]       [place] [address]             [phone]      1 Day Post-Op Procedure(s) (LRB): XI ROBOTIC ASSISTED THORASCOPY-LEFT LOWER LOBECTOMY (Left) LYMPH NODE DISSECTION (N/A) INTERCOSTAL NERVE BLOCK (Left) Subjective: Some pain around the tube   Objective: Vital signs in last 24 hours: Temp:  [97.8 F (36.6 C)-98.4 F (36.9 C)] 98.1 F (36.7 C) (09/04 0800) Pulse Rate:  [72-96] 93 (09/04 0800) Cardiac Rhythm: Normal sinus rhythm (09/04 0709) Resp:  [9-25] 20 (09/04 0828) BP: (125-146)/(80-98) 133/80 (09/04 0800) SpO2:  [93 %-100 %] 97 % (09/04 0852) Arterial Line BP: (146-175)/(64-81) 146/64 (09/04 0800)  Hemodynamic parameters for last 24 hours:    Intake/Output from previous day: 09/03 0701 - 09/04 0700 In: 3520 [P.O.:550; I.V.:2240; IV Piggyback:730] Out: 2735 [Urine:2060; Blood:300; Chest Tube:375] Intake/Output this shift: Total I/O In: 100 [P.O.:100] Out: 65 [Chest Tube:65]  General appearance: alert, cooperative and no distress Heart: regular rate and rhythm Lungs: clear to auscultation bilaterally Abdomen: benign Extremities: no edema Wound: dressings intact  Lab Results: Recent Labs    11/15/19 0041  WBC 14.7*  HGB 12.7*  HCT 38.2*  PLT 157   BMET:  Recent Labs    11/15/19 0041  NA 134*  K 4.2  CL 101  CO2 23  GLUCOSE 133*  BUN 10  CREATININE 0.89  CALCIUM 9.0    PT/INR: No results for input(s): LABPROT, INR in the last 72 hours. ABG    Component Value Date/Time   PHART 7.419 11/15/2019 0228   HCO3 26.1 11/15/2019 0228   O2SAT 97.4 11/15/2019 0228   CBG (last 3)  Recent Labs    11/14/19 2137 11/15/19 0622  GLUCAP 141* 116*    Meds Scheduled Meds: . acetaminophen  1,000 mg Oral Q6H   Or  . acetaminophen (TYLENOL) oral liquid 160 mg/5 mL  1,000 mg Oral Q6H  . bisacodyl  10 mg Oral Daily  . Chlorhexidine Gluconate Cloth  6 each Topical Daily  . enoxaparin (LOVENOX) injection  40 mg  Subcutaneous Daily  . fentaNYL   Intravenous Q4H  . fluticasone furoate-vilanterol  1 puff Inhalation Daily  . insulin aspart  0-24 Units Subcutaneous TID AC & HS  . ketorolac  15 mg Intravenous Q6H  . senna-docusate  1 tablet Oral QHS   Continuous Infusions: . sodium chloride    . sodium chloride 100 mL/hr at 11/14/19 1320   PRN Meds:.Place/Maintain arterial line **AND** sodium chloride, albuterol, diphenhydrAMINE **OR** diphenhydrAMINE, naloxone **AND** sodium chloride flush, ondansetron (ZOFRAN) IV, oxyCODONE, traMADol  Xrays DG Chest Port 1 View  Result Date: 11/15/2019 CLINICAL DATA:  Postop EXAM: PORTABLE CHEST 1 VIEW COMPARISON:  Yesterday FINDINGS: Left chest tube with tip at the apex. Mildly improved atelectasis with better defined diaphragm on the left. Mild atelectasis at the right base. Normal heart size for technique. IMPRESSION: Unremarkable postoperative chest.  No visible pneumothorax. Electronically Signed   By: Marnee Spring M.D.   On: 11/15/2019 08:53   DG Chest Port 1 View  Result Date: 11/14/2019 CLINICAL DATA:  Status post LEFT LOWER lobectomy. EXAM: PORTABLE CHEST 1 VIEW COMPARISON:  11/12/2019 chest radiograph and prior studies FINDINGS: The cardiomediastinal silhouette is unremarkable. LEFT postoperative changes and LEFT thoracostomy tube noted. There is a tiny LEFT MEDIAL pneumothorax present. LEFT basilar atelectasis and trace LEFT pleural effusion noted. The RIGHT lung is clear IMPRESSION: LEFT  postoperative changes with LEFT thoracostomy tube, tiny LEFT pneumothorax, LEFT basilar atelectasis and trace LEFT effusion. Electronically Signed   By: Harmon Pier M.D.   On: 11/14/2019 13:24    Assessment/Plan: S/P Procedure(s) (LRB): XI ROBOTIC ASSISTED THORASCOPY-LEFT LOWER LOBECTOMY (Left) LYMPH NODE DISSECTION (N/A) INTERCOSTAL NERVE BLOCK (Left)  POD#1 1 afeb VSS 2 sats ok on RA 3 CT no air leak- will place to H2O seal, mod drainage 375 cc 4 normal renal  fxn 5 very minor acute blood loss anemia 6 BS well controlled 7 normal renal fxn 8 routine rehab and pulm toilet   LOS: 1 day    Eduardo Moore 11/15/2019 Pt seen and examined; chart/films reviewed. Agree with chest tube to WS. Eduardo Ericson Z. Vickey Sages, MD [phone]

## 2019-11-15 NOTE — Plan of Care (Signed)
  Problem: Education: Goal: Knowledge of General Education information will improve Description: Including pain rating scale, medication(s)/side effects and non-pharmacologic comfort measures Outcome: Progressing   Problem: Health Behavior/Discharge Planning: Goal: Ability to manage health-related needs will improve Outcome: Progressing   Problem: Clinical Measurements: Goal: Ability to maintain clinical measurements within normal limits will improve Outcome: Progressing   Problem: Clinical Measurements: Goal: Will remain free from infection Outcome: Progressing   Problem: Clinical Measurements: Goal: Diagnostic test results will improve Outcome: Progressing   Problem: Clinical Measurements: Goal: Respiratory complications will improve Outcome: Progressing   Problem: Activity: Goal: Risk for activity intolerance will decrease Outcome: Progressing   Problem: Nutrition: Goal: Adequate nutrition will be maintained Outcome: Progressing   Problem: Coping: Goal: Level of anxiety will decrease Outcome: Progressing   Problem: Elimination: Goal: Will not experience complications related to bowel motility Outcome: Progressing   Problem: Pain Managment: Goal: General experience of comfort will improve Outcome: Progressing   Problem: Skin Integrity: Goal: Risk for impaired skin integrity will decrease Outcome: Progressing   Problem: Education: Goal: Knowledge of disease or condition will improve Outcome: Progressing   Problem: Education: Goal: Knowledge of the prescribed therapeutic regimen will improve Outcome: Progressing   Problem: Cardiac: Goal: Will achieve and/or maintain hemodynamic stability Outcome: Progressing   Problem: Clinical Measurements: Goal: Postoperative complications will be avoided or minimized Outcome: Progressing   Problem: Pain Management: Goal: Pain level will decrease Outcome: Progressing   Problem: Skin Integrity: Goal: Wound  healing without signs and symptoms infection will improve Outcome: Progressing

## 2019-11-16 ENCOUNTER — Inpatient Hospital Stay (HOSPITAL_COMMUNITY): Payer: Self-pay

## 2019-11-16 LAB — CBC
HCT: 37.6 % — ABNORMAL LOW (ref 39.0–52.0)
Hemoglobin: 12.2 g/dL — ABNORMAL LOW (ref 13.0–17.0)
MCH: 29.5 pg (ref 26.0–34.0)
MCHC: 32.4 g/dL (ref 30.0–36.0)
MCV: 90.8 fL (ref 80.0–100.0)
Platelets: 131 10*3/uL — ABNORMAL LOW (ref 150–400)
RBC: 4.14 MIL/uL — ABNORMAL LOW (ref 4.22–5.81)
RDW: 12.2 % (ref 11.5–15.5)
WBC: 11.4 10*3/uL — ABNORMAL HIGH (ref 4.0–10.5)
nRBC: 0 % (ref 0.0–0.2)

## 2019-11-16 LAB — GLUCOSE, CAPILLARY: Glucose-Capillary: 107 mg/dL — ABNORMAL HIGH (ref 70–99)

## 2019-11-16 LAB — COMPREHENSIVE METABOLIC PANEL
ALT: 21 U/L (ref 0–44)
AST: 17 U/L (ref 15–41)
Albumin: 3.2 g/dL — ABNORMAL LOW (ref 3.5–5.0)
Alkaline Phosphatase: 49 U/L (ref 38–126)
Anion gap: 6 (ref 5–15)
BUN: 12 mg/dL (ref 6–20)
CO2: 26 mmol/L (ref 22–32)
Calcium: 8.7 mg/dL — ABNORMAL LOW (ref 8.9–10.3)
Chloride: 104 mmol/L (ref 98–111)
Creatinine, Ser: 0.79 mg/dL (ref 0.61–1.24)
GFR calc Af Amer: >60 mL/min (ref >60)
GFR calc non Af Amer: >60 mL/min (ref >60)
Glucose, Bld: 106 mg/dL — ABNORMAL HIGH (ref 70–99)
Potassium: 3.8 mmol/L (ref 3.5–5.1)
Sodium: 136 mmol/L (ref 135–145)
Total Bilirubin: 1.1 mg/dL (ref 0.3–1.2)
Total Protein: 5.9 g/dL — ABNORMAL LOW (ref 6.5–8.1)

## 2019-11-16 NOTE — Plan of Care (Signed)
  Problem: Education: Goal: Knowledge of General Education information will improve Description: Including pain rating scale, medication(s)/side effects and non-pharmacologic comfort measures Outcome: Progressing   Problem: Health Behavior/Discharge Planning: Goal: Ability to manage health-related needs will improve Outcome: Progressing   Problem: Clinical Measurements: Goal: Ability to maintain clinical measurements within normal limits will improve Outcome: Progressing   Problem: Clinical Measurements: Goal: Diagnostic test results will improve Outcome: Progressing   Problem: Clinical Measurements: Goal: Respiratory complications will improve Outcome: Progressing   Problem: Activity: Goal: Risk for activity intolerance will decrease Outcome: Progressing   Problem: Nutrition: Goal: Adequate nutrition will be maintained Outcome: Progressing   Problem: Coping: Goal: Level of anxiety will decrease Outcome: Progressing   Problem: Elimination: Goal: Will not experience complications related to bowel motility Outcome: Progressing   Problem: Pain Managment: Goal: General experience of comfort will improve Outcome: Progressing   Problem: Skin Integrity: Goal: Risk for impaired skin integrity will decrease Outcome: Progressing   Problem: Education: Goal: Knowledge of disease or condition will improve Outcome: Progressing   Problem: Education: Goal: Knowledge of the prescribed therapeutic regimen will improve Outcome: Progressing   Problem: Cardiac: Goal: Will achieve and/or maintain hemodynamic stability Outcome: Progressing   Problem: Clinical Measurements: Goal: Postoperative complications will be avoided or minimized Outcome: Progressing   Problem: Pain Management: Goal: Pain level will decrease Outcome: Progressing   Problem: Skin Integrity: Goal: Wound healing without signs and symptoms infection will improve Outcome: Progressing

## 2019-11-16 NOTE — Progress Notes (Addendum)
[address]       [name] [address]             [phone]      2 Days Post-Op Procedure(s) (LRB): XI ROBOTIC ASSISTED THORASCOPY-LEFT LOWER LOBECTOMY (Left) LYMPH NODE DISSECTION (N/A) INTERCOSTAL NERVE BLOCK (Left) Subjective: Feels ok, some painfrom tube  Objective: Vital signs in last 24 hours: Temp:  [97.7 F (36.5 C)-98.2 F (36.8 C)] 98.2 F (36.8 C) (09/05 0458) Pulse Rate:  [74-89] 74 (09/05 0700) Cardiac Rhythm: Normal sinus rhythm (09/05 0805) Resp:  [12-26] 18 (09/05 0727) BP: (121-136)/(78-102) 127/78 (09/05 0700) SpO2:  [92 %-97 %] 97 % (09/05 0855) Arterial Line BP: (156)/(77) 156/77 (09/04 1200)  Hemodynamic parameters for last 24 hours:    Intake/Output from previous day: 09/04 0701 - 09/05 0700 In: 760 [P.O.:760] Out: 2345 [Urine:2150; Chest Tube:195] Intake/Output this shift: No intake/output data recorded.  General appearance: alert, cooperative and no distress Heart: regular rate and rhythm Lungs: clear to auscultation bilaterally Abdomen: benign Extremities: nno edema or calf tenderness Wound: incis healing well  Lab Results: Recent Labs    11/15/19 0041 11/16/19 0245  WBC 14.7* 11.4*  HGB 12.7* 12.2*  HCT 38.2* 37.6*  PLT 157 131*   BMET:  Recent Labs    11/15/19 0041 11/16/19 0245  NA 134* 136  K 4.2 3.8  CL 101 104  CO2 23 26  GLUCOSE 133* 106*  BUN 10 12  CREATININE 0.89 0.79  CALCIUM 9.0 8.7*    PT/INR: No results for input(s): LABPROT, INR in the last 72 hours. ABG    Component Value Date/Time   PHART 7.419 11/15/2019 0228   HCO3 26.1 11/15/2019 0228   O2SAT 97.4 11/15/2019 0228   CBG (last 3)  Recent Labs    11/15/19 1630 11/15/19 2120 11/16/19 0612  GLUCAP 111* 106* 107*    Meds Scheduled Meds: . acetaminophen  1,000 mg Oral Q6H   Or  . acetaminophen (TYLENOL) oral liquid 160 mg/5 mL  1,000 mg Oral Q6H  . bisacodyl  10 mg Oral Daily  . Chlorhexidine Gluconate Cloth  6  each Topical Daily  . enoxaparin (LOVENOX) injection  40 mg Subcutaneous Daily  . fentaNYL   Intravenous Q4H  . fluticasone furoate-vilanterol  1 puff Inhalation Daily  . insulin aspart  0-24 Units Subcutaneous TID AC & HS  . ketorolac  15 mg Intravenous Q6H  . senna-docusate  1 tablet Oral QHS   Continuous Infusions: . sodium chloride    . sodium chloride 100 mL/hr at 11/14/19 1320   PRN Meds:.Place/Maintain arterial line **AND** sodium chloride, albuterol, diphenhydrAMINE **OR** diphenhydrAMINE, naloxone **AND** sodium chloride flush, ondansetron (ZOFRAN) IV, oxyCODONE, traMADol  Xrays DG Chest 2 View  Result Date: 11/16/2019 CLINICAL DATA:  Surgery follow-up, left lower lobe aspergilloma with hemoptysis EXAM: CHEST - 2 VIEW COMPARISON:  11/15/2019 FINDINGS: Postsurgical changes in the left hemithorax with volume loss and left lower lobe/retrocardiac atelectasis. Left apical chest tube. No pneumothorax is seen. Right lung is clear.  No pleural effusion. The heart is normal in size with leftward cardiomediastinal shift. IMPRESSION: Postsurgical changes in the left hemithorax. Left apical chest tube. No pneumothorax is seen. Electronically Signed   By: Charline Bills M.D.   On: 11/16/2019 07:20   DG Chest Port 1 View  Result Date: 11/15/2019 CLINICAL DATA:  Postop EXAM: PORTABLE CHEST 1 VIEW COMPARISON:  Yesterday FINDINGS: Left chest tube with tip at the apex. Mildly  improved atelectasis with better defined diaphragm on the left. Mild atelectasis at the right base. Normal heart size for technique. IMPRESSION: Unremarkable postoperative chest.  No visible pneumothorax. Electronically Signed   By: Marnee Spring M.D.   On: 11/15/2019 08:53   DG Chest Port 1 View  Result Date: 11/14/2019 CLINICAL DATA:  Status post LEFT LOWER lobectomy. EXAM: PORTABLE CHEST 1 VIEW COMPARISON:  11/12/2019 chest radiograph and prior studies FINDINGS: The cardiomediastinal silhouette is unremarkable. LEFT  postoperative changes and LEFT thoracostomy tube noted. There is a tiny LEFT MEDIAL pneumothorax present. LEFT basilar atelectasis and trace LEFT pleural effusion noted. The RIGHT lung is clear IMPRESSION: LEFT postoperative changes with LEFT thoracostomy tube, tiny LEFT pneumothorax, LEFT basilar atelectasis and trace LEFT effusion. Electronically Signed   By: Harmon Pier M.D.   On: 11/14/2019 13:24    Assessment/Plan: S/P Procedure(s) (LRB): XI ROBOTIC ASSISTED THORASCOPY-LEFT LOWER LOBECTOMY (Left) LYMPH NODE DISSECTION (N/A) INTERCOSTAL NERVE BLOCK (Left)   1 doing well  2 afeb , VSS 3 sats good on RA 4 CT- 195 cc , no air leak- will d/c tube 5 CXR is stable in appearance, no pntx 6 leukocytosis stable 7 d/c SSI not a diabetic 8 poss home in am, trying to find someone who can stay with him as wife was admitted to mental health facility  LOS: 2 days    Rowe Clack PA-C Pager [phone] 11/16/2019 Pt seen and examined; agree with plan for chest tube removal and working on d/c plans. Kadeem Hyle Z. Vickey Sages, MD [phone]

## 2019-11-17 ENCOUNTER — Inpatient Hospital Stay (HOSPITAL_COMMUNITY): Payer: Self-pay

## 2019-11-17 MED ORDER — BISACODYL 10 MG RE SUPP
10.0000 mg | Freq: Once | RECTAL | Status: DC
  Filled 2019-11-17 (×2): qty 1

## 2019-11-17 NOTE — Plan of Care (Signed)

## 2019-11-17 NOTE — Plan of Care (Signed)
  Problem: Education: Goal: Knowledge of General Education information will improve Description: Including pain rating scale, medication(s)/side effects and non-pharmacologic comfort measures Outcome: Progressing   Problem: Health Behavior/Discharge Planning: Goal: Ability to manage health-related needs will improve Outcome: Progressing   Problem: Clinical Measurements: Goal: Ability to maintain clinical measurements within normal limits will improve Outcome: Progressing   Problem: Clinical Measurements: Goal: Will remain free from infection Outcome: Progressing   Problem: Clinical Measurements: Goal: Diagnostic test results will improve Outcome: Progressing   Problem: Activity: Goal: Risk for activity intolerance will decrease Outcome: Progressing   Problem: Nutrition: Goal: Adequate nutrition will be maintained Outcome: Progressing   Problem: Coping: Goal: Level of anxiety will decrease Outcome: Progressing   Problem: Elimination: Goal: Will not experience complications related to bowel motility Outcome: Progressing   Problem: Pain Managment: Goal: General experience of comfort will improve Outcome: Progressing   Problem: Skin Integrity: Goal: Risk for impaired skin integrity will decrease Outcome: Progressing   Problem: Education: Goal: Knowledge of the prescribed therapeutic regimen will improve Outcome: Progressing   Problem: Clinical Measurements: Goal: Postoperative complications will be avoided or minimized Outcome: Progressing   Problem: Pain Management: Goal: Pain level will decrease Outcome: Progressing   Problem: Skin Integrity: Goal: Wound healing without signs and symptoms infection will improve Outcome: Progressing

## 2019-11-17 NOTE — Progress Notes (Addendum)
[address]       [place] [address]             [phone]      3 Days Post-Op Procedure(s) (LRB): XI ROBOTIC ASSISTED THORASCOPY-LEFT LOWER LOBECTOMY (Left) LYMPH NODE DISSECTION (N/A) INTERCOSTAL NERVE BLOCK (Left) Subjective: Feels nauseated and dizzy with walking C/O pain, mostly back pain not incisional   Objective: Vital signs in last 24 hours: Temp:  [97.8 F (36.6 C)-98.8 F (37.1 C)] 98.5 F (36.9 C) (09/06 0727) Pulse Rate:  [78-94] 87 (09/06 0727) Cardiac Rhythm: Normal sinus rhythm (09/06 0701) Resp:  [16-25] 18 (09/06 0727) BP: (127-143)/(74-89) 131/85 (09/06 0727) SpO2:  [93 %-97 %] 97 % (09/06 0727)  Hemodynamic parameters for last 24 hours:    Intake/Output from previous day: 09/05 0701 - 09/06 0700 In: 960 [P.O.:960] Out: 1575 [Urine:1575] Intake/Output this shift: No intake/output data recorded.  General appearance: alert, cooperative and no distress Heart: regular rate and rhythm Lungs: slightly coarse throughout Abdomen: benign Extremities: no edema or calf tenderness Wound: incis healing well  Lab Results: Recent Labs    11/15/19 0041 11/16/19 0245  WBC 14.7* 11.4*  HGB 12.7* 12.2*  HCT 38.2* 37.6*  PLT 157 131*   BMET:  Recent Labs    11/15/19 0041 11/16/19 0245  NA 134* 136  K 4.2 3.8  CL 101 104  CO2 23 26  GLUCOSE 133* 106*  BUN 10 12  CREATININE 0.89 0.79  CALCIUM 9.0 8.7*    PT/INR: No results for input(s): LABPROT, INR in the last 72 hours. ABG    Component Value Date/Time   PHART 7.419 11/15/2019 0228   HCO3 26.1 11/15/2019 0228   O2SAT 97.4 11/15/2019 0228   CBG (last 3)  Recent Labs    11/15/19 1630 11/15/19 2120 11/16/19 0612  GLUCAP 111* 106* 107*    Meds Scheduled Meds: . acetaminophen  1,000 mg Oral Q6H   Or  . acetaminophen (TYLENOL) oral liquid 160 mg/5 mL  1,000 mg Oral Q6H  . bisacodyl  10 mg Oral Daily  . Chlorhexidine Gluconate Cloth  6 each Topical Daily  .  enoxaparin (LOVENOX) injection  40 mg Subcutaneous Daily  . fluticasone furoate-vilanterol  1 puff Inhalation Daily  . ketorolac  15 mg Intravenous Q6H  . senna-docusate  1 tablet Oral QHS   Continuous Infusions: . sodium chloride    . sodium chloride 100 mL/hr at 11/14/19 1320   PRN Meds:.Place/Maintain arterial line **AND** sodium chloride, albuterol, oxyCODONE, traMADol  Xrays DG Chest 2 View  Result Date: 11/17/2019 CLINICAL DATA:  Surgery follow-up EXAM: CHEST - 2 VIEW COMPARISON:  11/16/2019 FINDINGS: Interval removal of left apical chest tube. Small left pneumothorax. Trace left pleural effusion component. Right lung is clear. The heart is normal in size. IMPRESSION: Interval removal of left apical chest tube. Small left pneumothorax. Electronically Signed   By: Charline Bills M.D.   On: 11/17/2019 06:13   DG Chest 2 View  Result Date: 11/16/2019 CLINICAL DATA:  Surgery follow-up, left lower lobe aspergilloma with hemoptysis EXAM: CHEST - 2 VIEW COMPARISON:  11/15/2019 FINDINGS: Postsurgical changes in the left hemithorax with volume loss and left lower lobe/retrocardiac atelectasis. Left apical chest tube. No pneumothorax is seen. Right lung is clear.  No pleural effusion. The heart is normal in size with leftward cardiomediastinal shift. IMPRESSION: Postsurgical changes in the left hemithorax. Left apical chest tube. No pneumothorax is seen. Electronically Signed  By: Charline Bills M.D.   On: 11/16/2019 07:20    Assessment/Plan: S/P Procedure(s) (LRB): XI ROBOTIC ASSISTED THORASCOPY-LEFT LOWER LOBECTOMY (Left) LYMPH NODE DISSECTION (N/A) INTERCOSTAL NERVE BLOCK (Left)   1 doing well 2 some dizzyness/ nausea with ambulation- cont to work on this, also pain control 3 afeb, VSS, some HTN readings- monitor  4 sats good on RA 5 small left pntx on CXR- repeat in am, no new labs 6 mobilize/pulm toilet 7 hopefully home in am  LOS: 3 days    Rowe Clack PA-C Pager [phone]  [phone] 11/17/2019   Agree with above Small space on CXR Will reassess tomorrow Home tomorrow  Corliss Skains

## 2019-11-18 ENCOUNTER — Other Ambulatory Visit: Payer: Self-pay | Admitting: Physician Assistant

## 2019-11-18 ENCOUNTER — Inpatient Hospital Stay (HOSPITAL_COMMUNITY): Payer: Self-pay

## 2019-11-18 LAB — SURGICAL PATHOLOGY

## 2019-11-18 MED ORDER — OXYCODONE HCL 5 MG PO TABS
5.0000 mg | ORAL_TABLET | ORAL | 0 refills | Status: AC | PRN
Start: 2019-11-18 — End: 2019-11-25

## 2019-11-18 NOTE — Discharge Summary (Signed)
Physician Discharge Summary  Patient ID: Jihan Rudy MRN: [id number] DOB/AGE: [birth year] 39 y.o.  Admit date: 11/14/2019 Discharge date: 11/18/2019  Admission Diagnoses:  Aspergilloma left lower lung lobe Depression   Discharge Diagnoses:   Aspergilloma left lower lung lobe Depression Status post robot-assisted left lower lobectomy of lung     Discharged Condition: stable  History of Present Illness:   Anthone Maebelle Munroe is a 39 year old Hispanic man with a history of allergies and wheezing.  He is a lifelong non-smoker.  He works Cytogeneticist.  Last Spring he developed worsening wheezing and then later on developed hemoptysis as well.  He was found to have a left lower lobe mass as well as other cavitary areas in the left lower lobe on CT.   He saw Dr.Ramaswamy.  A QuantiFERON gold test was negative. On PET CT there was uptake with an SUV of 3.3.    He was scheduled for a robotic surgical resection but then opted not to have surgery.  He finally agreed to a bronchoscopic biopsy.  That was done on 10/02/2019.  Biopsy showed aspergilloma.   At one point he was going to move to [place] but now has decided not to move.  He was advised to have surgical resection because of the risk of life-threatening hemoptysis.  He was very reluctant to consider surgery.  He had an appointment with infectious disease, but missed that due to some abdominal discomfort.  He was seen in the emergency room for that.  A CT of the abdomen and pelvis did not show any source for his discomfort.   Hospital Course:   On 11/14/2019 Mr. Nolasco underwent a roboti-assisted left lower lobectomy with Dr. [name]. He tolerated the procedure well and was transferred to the PACU in stable condition where he was initially recovered then he was transferred to Progressive care. He had no air leak from the chest tube and it was removed on the first post-op day.  He was transitioned from IV to oral  analgesics. Activity was advanced and well tolerated. The chest x-ray showed a small left apical space that remained stable on the follow up film the following day. His incisions were healing with no sign of complication on the day of discharge.    Consults: None  Significant Diagnostic Studies:   EXAM: CT CHEST WITHOUT CONTRAST  TECHNIQUE: Multidetector CT imaging of the chest was performed using thin slice collimation for electromagnetic bronchoscopy planning purposes, without intravenous contrast.  COMPARISON:  PET-CT 08/21/2019.  FINDINGS: Cardiovascular: Heart size is normal. There is no significant pericardial fluid, thickening or pericardial calcification. No atherosclerotic calcifications in the thoracic aorta or the coronary arteries.  Mediastinum/Nodes: No pathologically enlarged mediastinal or hilar lymph nodes. Please note that accurate exclusion of hilar adenopathy is limited on noncontrast CT scans. Esophagus is unremarkable in appearance. No axillary lymphadenopathy.  Lungs/Pleura: Persistent mass in the superior segment of the left lower lobe (axial image 60 of series 8 and coronal image 95 of series 4) measuring 3.7 x 3.2 x 4.2 cm with some surrounding ground-glass attenuation, septal thickening and regional architectural distortion which likely reflects adjacent postobstructive changes. No other definite suspicious pulmonary nodules or masses are noted. Scattered areas of cylindrical and varicose bronchiectasis in the lower lobes of the lungs bilaterally. No pleural effusions.  Upper Abdomen: Unremarkable.  Musculoskeletal: There are no aggressive appearing lytic or blastic lesions noted in the visualized portions of the skeleton.  IMPRESSION: 1. Persistent mass in the superior segment  of the left lower lobe measuring 3.7 x 3.2 x 4.2 cm. This remains suspicious for neoplasm, although the possibility of an infectious etiology such as a mycetoma  also warrants consideration. 2. Bronchiectasis in the lower lobes of the lungs bilaterally.   Electronically Signed   By: Trudie Reed M.D.   On: 09/30/2019 11:16   EXAM: CHEST - 2 VIEW  COMPARISON:  11/17/2019  FINDINGS: Heart size and pulmonary vascularity are normal. Persistent small left apical pneumothorax, unchanged. Linear atelectasis in the left lung base. Small left pleural effusion or thickening. Right lung is clear.  IMPRESSION: Persistent small left hydropneumothorax, similar to prior study.   Electronically Signed   By: Burman Nieves M.D.   On: 11/18/2019 06:27  Treatments:   OPERATIVE REPORT  DATE OF PROCEDURE:  11/14/2019  PREOPERATIVE DIAGNOSIS:  Aspergilloma left lower lobe with hemoptysis.  POSTOPERATIVE DIAGNOSIS:  Aspergilloma left lower lobe with hemoptysis.  PROCEDURE:  Xi robotic video-assisted left thoracoscopy for lysis of adhesions, left lower lobectomy, lymph node dissection, and intercostal nerve blocks at levels 3 through 10.  SURGEON:  Charlett Lango, MD  ASSISTANT:  Jari Favre, PA-C  ANESTHESIA:  General.  FINDINGS:  Severe adhesions throughout the pleural space.  Mass clearly visible in superior segment.  CLINICAL NOTE:  The patient is a 39 year old man who presented with cough, wheezing and hemoptysis.  He was found to have a mass in the superior segment of the left lower lobe.  He originally was scheduled for surgery, but decided instead he would like  to have a biopsy done.  That was done with navigational bronchoscopy and showed an aspergilloma.  Given the size of the mass and his hemoptysis, he was advised to undergo surgical resection.  In addition to the primary aspergilloma, there were multiple  other cavitary lesions in the left lower lobe.  He was advised to have a lower lobectomy for definitive treatment.  The indications, risks, benefits, and alternatives were discussed in detail with the  patient.  He understood and accepted the risks and  agreed to proceed.  Discharge Exam: Blood pressure 137/80, pulse 84, temperature 98 F (36.7 C), temperature source Oral, resp. rate 17, height 5\' 8"  (1.727 m), weight 90.2 kg, SpO2 98 %.  General appearance: alert, cooperative and mild distress Neurologic: intact Heart: regular rate and rhythm Lungs: breath sounds CTA. CXR reviewed- no significant change in the left apical air space.  Wound: the incisions are intact and dry.   Disposition: Discharge disposition: 01-Home or Self Care        Allergies as of 11/18/2019      Reactions   Lentil Shortness Of Breath, Rash   lentils   Pea Shortness Of Breath, Rash   Green Peas      Medication List    TAKE these medications   albuterol (2.5 MG/3ML) 0.083% nebulizer solution Commonly known as: PROVENTIL Take 2.5 mg by nebulization 2 (two) times daily as needed for wheezing or shortness of breath.   albuterol 108 (90 Base) MCG/ACT inhaler Commonly known as: VENTOLIN HFA Inhale 2 puffs into the lungs every 6 (six) hours as needed for wheezing or shortness of breath.   Breo Ellipta 200-25 MCG/INH Aepb Generic drug: fluticasone furoate-vilanterol Inhale 1 puff into the lungs daily.   Icy Hot 5 % Ptch Generic drug: Menthol Place 1 patch onto the skin daily as needed (pain).   omeprazole 20 MG capsule Commonly known as: PRILOSEC Take 1 capsule (20 mg total) by  mouth 2 (two) times daily before a meal.   ondansetron 4 MG tablet Commonly known as: Zofran Take 1 tablet (4 mg total) by mouth every 8 (eight) hours as needed for nausea or vomiting.   oxyCODONE 5 MG immediate release tablet Commonly known as: Oxy IR/ROXICODONE Take 1-2 tablets (5-10 mg total) by mouth every 4 (four) hours as needed for up to 7 days for moderate pain.   Vitamin B Complex 100 Inj Inject 1 Dose as directed every other day.   Vitamin D-3 125 MCG (5000 UT) Tabs Take 5,000 Units by mouth daily in  the afternoon.       Follow-up Information    Storm Frisk, MD. Call in 1 day(s).   Specialty: Pulmonary Disease Contact information: [address] [place] [address] [phone]        Loreli Slot, MD. Go on 11/25/2019.   Specialty: Cardiothoracic Surgery Why: Your appointment is at 10:45am.  Please arrive 30 minutes early for a chest X-ray to be performed by [facility] located on the first floor of the same building.  Contact information: [address] [place] [address] [phone]               Signed: Leary Roca 11/18/2019, 2:36 PM

## 2019-11-18 NOTE — Plan of Care (Signed)
  Problem: Education: Goal: Knowledge of General Education information will improve Description: Including pain rating scale, medication(s)/side effects and non-pharmacologic comfort measures Outcome: Adequate for Discharge   Problem: Health Behavior/Discharge Planning: Goal: Ability to manage health-related needs will improve Outcome: Adequate for Discharge   Problem: Clinical Measurements: Goal: Ability to maintain clinical measurements within normal limits will improve Outcome: Adequate for Discharge Goal: Will remain free from infection Outcome: Adequate for Discharge Goal: Diagnostic test results will improve Outcome: Adequate for Discharge Goal: Respiratory complications will improve Outcome: Adequate for Discharge Goal: Cardiovascular complication will be avoided Outcome: Adequate for Discharge   Problem: Activity: Goal: Risk for activity intolerance will decrease Outcome: Adequate for Discharge   Problem: Nutrition: Goal: Adequate nutrition will be maintained Outcome: Adequate for Discharge   Problem: Coping: Goal: Level of anxiety will decrease Outcome: Adequate for Discharge   Problem: Elimination: Goal: Will not experience complications related to bowel motility Outcome: Adequate for Discharge Goal: Will not experience complications related to urinary retention Outcome: Adequate for Discharge   Problem: Pain Managment: Goal: General experience of comfort will improve Outcome: Adequate for Discharge   Problem: Safety: Goal: Ability to remain free from injury will improve Outcome: Adequate for Discharge   Problem: Skin Integrity: Goal: Risk for impaired skin integrity will decrease Outcome: Adequate for Discharge   Problem: Education: Goal: Knowledge of disease or condition will improve Outcome: Adequate for Discharge Goal: Knowledge of the prescribed therapeutic regimen will improve Outcome: Adequate for Discharge   Problem: Activity: Goal: Risk for  activity intolerance will decrease Outcome: Adequate for Discharge   Problem: Cardiac: Goal: Will achieve and/or maintain hemodynamic stability Outcome: Adequate for Discharge   Problem: Clinical Measurements: Goal: Postoperative complications will be avoided or minimized Outcome: Adequate for Discharge   Problem: Respiratory: Goal: Respiratory status will improve Outcome: Adequate for Discharge   Problem: Pain Management: Goal: Pain level will decrease Outcome: Adequate for Discharge   Problem: Skin Integrity: Goal: Wound healing without signs and symptoms infection will improve Outcome: Adequate for Discharge   

## 2019-11-18 NOTE — Progress Notes (Addendum)
      [address]       [place] [address]             [phone]      4 Days Post-Op Procedure(s) (LRB): XI ROBOTIC ASSISTED THORASCOPY-LEFT LOWER LOBECTOMY (Left) LYMPH NODE DISSECTION (N/A) INTERCOSTAL NERVE BLOCK (Left) Subjective: Complains of peri-incisional back pain. Otherwise doing well, ambulating, tolerating PO's. Maintaining sats on RA.   Objective: Vital signs in last 24 hours: Temp:  [97.8 F (36.6 C)-98.4 F (36.9 C)] 98.1 F (36.7 C) (09/07 0300) Pulse Rate:  [74-87] 84 (09/07 0300) Cardiac Rhythm: Normal sinus rhythm (09/07 0300) Resp:  [15-21] 21 (09/06 2337) BP: (124-139)/(78-92) 136/92 (09/07 0300) SpO2:  [93 %-100 %] 98 % (09/07 0729)     Intake/Output from previous day: 09/06 0701 - 09/07 0700 In: 840 [P.O.:840] Out: -  Intake/Output this shift: No intake/output data recorded.  General appearance: alert, cooperative and mild distress Neurologic: intact Heart: regular rate and rhythm Lungs: breath sounds CTA. CXR reviewed- no significant change in the left apical air space.  Wound: the incisions are intact and dry.   Lab Results: Recent Labs    11/16/19 0245  WBC 11.4*  HGB 12.2*  HCT 37.6*  PLT 131*   BMET:  Recent Labs    11/16/19 0245  NA 136  K 3.8  CL 104  CO2 26  GLUCOSE 106*  BUN 12  CREATININE 0.79  CALCIUM 8.7*    PT/INR: No results for input(s): LABPROT, INR in the last 72 hours. ABG    Component Value Date/Time   PHART 7.419 11/15/2019 0228   HCO3 26.1 11/15/2019 0228   O2SAT 97.4 11/15/2019 0228   CBG (last 3)  Recent Labs    11/15/19 1630 11/15/19 2120 11/16/19 0612  GLUCAP 111* 106* 107*    Assessment/Plan: S/P Procedure(s) (LRB): XI ROBOTIC ASSISTED THORASCOPY-LEFT LOWER LOBECTOMY (Left) LYMPH NODE DISSECTION (N/A) INTERCOSTAL NERVE BLOCK (Left)  -POD-4 robot-assisted left lower lobectomy, CT out, respiratory status and CXR stable.  Discharge home today.  Instructions given.      LOS: 4 days    Parke Poisson [phone] 11/18/2019 Patient seen and examined, agree with above CXR shows a space, unchanged from yesterday Remains extremely anxious Ok for [employer]. [name], MD [facility] [phone]

## 2019-11-19 ENCOUNTER — Telehealth: Payer: Self-pay

## 2019-11-19 LAB — TYPE AND SCREEN
ABO/RH(D): O POS
Antibody Screen: NEGATIVE
Unit division: 0
Unit division: 0

## 2019-11-19 LAB — BPAM RBC
Blood Product Expiration Date: 202110052359
Blood Product Expiration Date: 202110052359
Unit Type and Rh: 5100
Unit Type and Rh: 5100

## 2019-11-19 LAB — AEROBIC/ANAEROBIC CULTURE W GRAM STAIN (SURGICAL/DEEP WOUND): Culture: NO GROWTH

## 2019-11-19 NOTE — Telephone Encounter (Signed)
Transition Care Management Follow-up Telephone Call Call completed with assistance of Spanish interpreter # [id number]/[employer]  Date of discharge and from where: 11/18/2019, [facility]   How have you been since you were released from the hospital? He said that he has some pain which he thinks is normal. He has been taking oxycodone 2 tablets every 4 hours as needed and he also said that he is taking some tylenol. He stated that the medication does help the pain,.  He is also tired and has some shortness of breath. He said that the shortness of breath occurs when he is walking or talking and he is very concerned about this,  Explained to him that he can go to Urgent Care or ED.  He can also call his Careers adviser.  Provided him with the address for Urgent Care as he requested.,   Any questions or concerns?  noted above  Items Reviewed:  Did the pt receive and understand the discharge instructions provided?  yes  Medications obtained and verified? yes, he has his medications and did not have any questions about his med regime,   Any new allergies since your discharge?  none reported   Do you have support at home?  he said that his wife is currently in a center for depression and his in laws are assisting him as needed  No home health or DME ordered  Has a nebulizer but said that he has not needed to use it,   Functional Questionnaire: (I = Independent and D = Dependent) ADLs: independent  Follow up appointments reviewed:   PCP Hospital f/u appt confirmed?Dr Jillyn Hidden - 12/05/2019   Specialist Hospital f/u appt confirmed? Thoracic surgery 11/25/2019  Are transportation arrangements needed? no, he said that he would be able to have someone drive him   If their condition worsens, is the pt aware to call PCP or go to the Emergency Dept.?  yes  Was the patient provided with contact information for the PCP's office or ED? He said he has the phone number for [facility] as well as the  surgeon.   Was to pt encouraged to call back with questions or concerns?  yes

## 2019-11-20 ENCOUNTER — Encounter (HOSPITAL_COMMUNITY): Payer: Self-pay | Admitting: Emergency Medicine

## 2019-11-20 ENCOUNTER — Inpatient Hospital Stay (HOSPITAL_COMMUNITY)
Admission: EM | Admit: 2019-11-20 | Discharge: 2019-11-20 | DRG: 188 | Disposition: A | Discharge status: Home / Self Care | Payer: Self-pay | Attending: Cardiothoracic Surgery | Admitting: Cardiothoracic Surgery

## 2019-11-20 ENCOUNTER — Other Ambulatory Visit: Payer: Self-pay

## 2019-11-20 ENCOUNTER — Emergency Department (HOSPITAL_COMMUNITY): Payer: Self-pay

## 2019-11-20 DIAGNOSIS — J948 Other specified pleural conditions: Principal | ICD-10-CM | POA: Diagnosis present

## 2019-11-20 DIAGNOSIS — Z825 Family history of asthma and other chronic lower respiratory diseases: Secondary | ICD-10-CM

## 2019-11-20 DIAGNOSIS — K2100 Gastro-esophageal reflux disease with esophagitis, without bleeding: Secondary | ICD-10-CM | POA: Diagnosis present

## 2019-11-20 DIAGNOSIS — Z902 Acquired absence of lung [part of]: Secondary | ICD-10-CM

## 2019-11-20 DIAGNOSIS — J939 Pneumothorax, unspecified: Secondary | ICD-10-CM | POA: Diagnosis present

## 2019-11-20 LAB — CBC
HCT: 41.1 % (ref 39.0–52.0)
Hemoglobin: 13.1 g/dL (ref 13.0–17.0)
MCH: 29.1 pg (ref 26.0–34.0)
MCHC: 31.9 g/dL (ref 30.0–36.0)
MCV: 91.3 fL (ref 80.0–100.0)
Platelets: 209 10*3/uL (ref 150–400)
RBC: 4.5 MIL/uL (ref 4.22–5.81)
RDW: 12.3 % (ref 11.5–15.5)
WBC: 10.1 10*3/uL (ref 4.0–10.5)
nRBC: 0 % (ref 0.0–0.2)

## 2019-11-20 LAB — BASIC METABOLIC PANEL
Anion gap: 9 (ref 5–15)
BUN: 9 mg/dL (ref 6–20)
CO2: 27 mmol/L (ref 22–32)
Calcium: 9.3 mg/dL (ref 8.9–10.3)
Chloride: 100 mmol/L (ref 98–111)
Creatinine, Ser: 0.7 mg/dL (ref 0.61–1.24)
GFR calc Af Amer: >60 mL/min (ref >60)
GFR calc non Af Amer: >60 mL/min (ref >60)
Glucose, Bld: 119 mg/dL — ABNORMAL HIGH (ref 70–99)
Potassium: 4.2 mmol/L (ref 3.5–5.1)
Sodium: 136 mmol/L (ref 135–145)

## 2019-11-20 LAB — TROPONIN I (HIGH SENSITIVITY)
Troponin I (High Sensitivity): 3 ng/L (ref <18)
Troponin I (High Sensitivity): 4 ng/L (ref <18)

## 2019-11-20 MED ORDER — OXYCODONE-ACETAMINOPHEN 5-325 MG PO TABS
1.0000 | ORAL_TABLET | ORAL | Status: DC | PRN
  Administered 2019-11-20: 1 via ORAL
  Filled 2019-11-20: qty 1

## 2019-11-20 MED ORDER — IOHEXOL 350 MG/ML SOLN
100.0000 mL | Freq: Once | INTRAVENOUS | Status: AC | PRN
  Administered 2019-11-20: 100 mL via INTRAVENOUS

## 2019-11-20 MED ORDER — OXYCODONE-ACETAMINOPHEN 5-325 MG PO TABS
2.0000 | ORAL_TABLET | Freq: Once | ORAL | Status: AC
  Administered 2019-11-20: 2 via ORAL
  Filled 2019-11-20: qty 2

## 2019-11-20 NOTE — ED Provider Notes (Addendum)
[facility] Note   CSN: [id number] Arrival date & time: 11/20/19  1306     History Chief Complaint  Patient presents with  . Chest Pain    Keilan Maebelle MunroeRosales Nolasco is a 39 y.o. 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  Patient states that he was feeling okay at discharge but yesterday evening felt sudden worsening of left lower chest pain and some shortness of breath that limited ambulation.  States that he has felt some chills and feels like his heart is been racing but has not measured a fever.  Patient states being faithful to his home analgesia regimen without significant relief.  The history is provided by the patient.  Chest Pain Pain location:  L chest Pain quality: aching   Pain radiates to:  Does not radiate Pain severity:  Severe Onset quality:  Sudden Duration:  1 day Timing:  Constant Progression:  Unchanged Chronicity:  New Context: breathing   Associated symptoms: fever, palpitations and shortness of breath   Associated symptoms: no abdominal pain, no cough, no dizziness, no headache and no vomiting        Past Medical History:  Diagnosis Date  . Anxiety   . Depression   . Dyspnea    when walking  . GERD (gastroesophageal reflux disease)     Patient Active Problem List   Diagnosis Date Noted  . Pneumothorax 11/20/2019  . Status post robot-assisted surgical procedure 11/14/2019  . S/P robot-assisted surgical procedure 11/14/2019  . Depression 11/11/2019  . Hemoptysis 11/11/2019  . Anxiety about health 11/03/2019  . 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 INTERCOSTAL NERVE BLOCK Left 11/14/2019   Procedure: INTERCOSTAL NERVE BLOCK;  Surgeon: Loreli SlotHendrickson, Steven C, MD;  [facility];   Service: Thoracic;  Laterality: Left;  . LYMPH NODE DISSECTION N/A 11/14/2019   Procedure: LYMPH NODE DISSECTION;  Surgeon: Loreli SlotHendrickson, Steven C, MD;  [facility];  Service: Thoracic;  Laterality: N/A;  . VIDEO BRONCHOSCOPY WITH ENDOBRONCHIAL NAVIGATION N/A 10/02/2019   Procedure: VIDEO BRONCHOSCOPY WITH ENDOBRONCHIAL NAVIGATION;  Surgeon: Loreli SlotHendrickson, Steven C, MD;  Location: [facility] OR;  Service: Thoracic;  Laterality: N/A;       Family History  Problem Relation Age of Onset  . Asthma Mother     Social History   Tobacco Use  . Smoking status: Never Smoker  . Smokeless tobacco: Never Used  Vaping Use  . Vaping Use: Never used  Substance Use Topics  . Alcohol use: Yes    Comment: occasional  . Drug use: Never    Home Medications Prior to Admission medications   Medication Sig Start Date End Date Taking? Authorizing Provider  acetaminophen (TYLENOL) 500 MG tablet Take 500 mg by mouth every 6 (six) hours as needed for mild pain.   Yes [provider]  albuterol (PROVENTIL) (2.5 MG/3ML) 0.083% nebulizer solution Take 2.5 mg by nebulization 2 (two) times daily as needed for wheezing or shortness of breath.   Yes [provider]  albuterol (VENTOLIN HFA) 108 (90 Base) MCG/ACT inhaler Inhale 2 puffs into the lungs every 6 (six) hours as needed for wheezing or shortness of breath. 06/23/19  Yes [provider]  fluticasone furoate-vilanterol (BREO ELLIPTA) 200-25 MCG/INH AEPB Inhale 1 puff into the lungs daily. 11/11/19  Yes Kalman Shan, MD  omeprazole (PRILOSEC) 20 MG capsule Take 1 capsule (20 mg total) by mouth 2 (two) times daily before a meal. 10/23/19  Yes Glenford Bayley, NP  oxyCODONE (OXY IR/ROXICODONE) 5 MG immediate release tablet Take 1-2 tablets (5-10 mg total) by mouth every 4 (four) hours as needed for up to 7 days for moderate pain. 11/18/19 11/25/19 Yes Roddenberry, Myron G, PA-C  ondansetron (ZOFRAN) 4 MG tablet Take 1 tablet (4 mg total) by  mouth every 8 (eight) hours as needed for nausea or vomiting. Patient not taking: Reported on 11/20/2019 10/23/19   Glenford Bayley, NP    Allergies    Lentil and Pea  Review of Systems   Review of Systems  Constitutional: Positive for chills and fever.  HENT: Negative for facial swelling and voice change.   Eyes: Negative for redness and visual disturbance.  Respiratory: Positive for chest tightness and shortness of breath. Negative for cough.   Cardiovascular: Positive for chest pain and palpitations.  Gastrointestinal: Negative for abdominal pain and vomiting.  Genitourinary: Negative for difficulty urinating and dysuria.  Musculoskeletal: Negative for gait problem and joint swelling.  Skin: Positive for wound. Negative for rash.  Neurological: Negative for dizziness and headaches.  Psychiatric/Behavioral: Negative for confusion and suicidal ideas.    Physical Exam Updated Vital Signs BP (!) 136/92   Pulse 79   Temp 99.1 F (37.3 C) (Oral)   Resp 17   SpO2 98%   Physical Exam Constitutional:      General: He is in acute distress.     Appearance: He is ill-appearing.  HENT:     Head: Normocephalic and atraumatic.     Mouth/Throat:     Mouth: Mucous membranes are moist.     Pharynx: Oropharynx is clear.  Eyes:     General: No scleral icterus.    Pupils: Pupils are equal, round, and reactive to light.  Cardiovascular:     Rate and Rhythm: Normal rate and regular rhythm.     Pulses: Normal pulses.  Pulmonary:     Effort: Pulmonary effort is normal. No respiratory distress.  Chest:     Comments: Surgical sites benign.  Some tenderness to the left lateral and posterior chest wall.  Diminished breath sounds on the left base Abdominal:     General: There is no distension.     Tenderness: There is no abdominal tenderness.  Musculoskeletal:        General: No tenderness or deformity.     Cervical back: Normal range of motion and neck supple.  Neurological:      General: No focal deficit present.     Mental Status: He is alert and oriented to person, place, and time.  Psychiatric:        Mood and Affect: Mood normal.        Behavior: Behavior normal.     ED Results / Procedures / Treatments   Labs (all labs ordered are listed, but only abnormal results are displayed) Labs Reviewed  BASIC METABOLIC PANEL - Abnormal; Notable for the following components:      Result Value   Glucose, Bld 119 (*)    All other components within normal limits  SARS CORONAVIRUS 2 BY RT PCR (HOSPITAL ORDER, PERFORMED IN [facility])  CBC  TROPONIN I (HIGH SENSITIVITY)  TROPONIN I (HIGH SENSITIVITY)    EKG EKG Interpretation  Date/Time:  Thursday November 20 2019 16:07:48 EDT Ventricular Rate:  103 PR  Interval:  124 QRS Duration: 82 QT Interval:  328 QTC Calculation: 429 R Axis:   78 Text Interpretation: Sinus tachycardia with frequent Premature atrial complexes Confirmed by Gwyneth Sprout ([id number]) on 11/20/2019 4:27:14 PM   Radiology DG Chest 2 View  Result Date: 11/20/2019 CLINICAL DATA:  39 year old male with left chest pain, low back pain, shortness of breath. Postoperative day 6 left lower lobectomy for aspergilloma. EXAM: CHEST - 2 VIEW COMPARISON:  Chest radiographs 11/18/2019 and earlier. FINDINGS: Stable left pneumothorax since 11/17/2019, small and most apparent in the apex. But also worsening ventilation throughout the left lung base, associated with an unusual linear retrocardiac lucency on the anterior image. The lateral view suggests horizontal layering fluid in the posterior left hemithorax. Mediastinal contours are stable. The right lung appears clear. Visualized tracheal air column is within normal limits. No acute osseous abnormality identified. Negative visible bowel gas pattern. IMPRESSION: 1. Persistent postoperative left hydropneumothorax with increasing opacification of the left lung base, difficult to exclude new airspace  disease. 2. The right lung remains clear. Electronically Signed   By: Odessa Fleming M.D.   On: 11/20/2019 13:55   CT ANGIO CHEST PE W OR WO CONTRAST  Result Date: 11/20/2019 CLINICAL DATA:  Shortness of breath and left-sided chest pain. Recent left lower lobectomy, discharged 3 days ago. EXAM: CT ANGIOGRAPHY CHEST WITH CONTRAST TECHNIQUE: Multidetector CT imaging of the chest was performed using the standard protocol during bolus administration of intravenous contrast. Multiplanar CT image reconstructions and MIPs were obtained to evaluate the vascular anatomy. CONTRAST:  OMNIPAQUE IOHEXOL 350 MG/ML SOLN COMPARISON:  Radiograph earlier today.  Preoperative CT 09/30/2019 FINDINGS: Cardiovascular: There are no filling defects within the pulmonary arteries to suggest pulmonary embolus. The thoracic aorta is normal in caliber. No aortic dissection. The heart is normal in size. Fluid abutting the left heart border represents loculated pleural fluid rather than pericardial effusion. Mediastinum/Nodes: 6 mm prevascular node. Soft tissue density at the left hilum likely represents combination of small lymph nodes and postsurgical change. Prominent lower right hilar node measuring 11 mm, series 5, image 69. No visualized thyroid nodule. No esophageal wall thickening. Lungs/Pleura: Postoperative changes at the left hilum post lower lobectomy. Left hydropneumothorax, with moderate air component anteriorly. Left pleural fluid is partially loculated tracking at the apex superiorly, and at the left lung base. There is no definite pleural enhancement. Occasional atelectasis throughout the left upper lobe as well as minimal ground-glass opacity in the perihilar region, nonspecific. Right lower lobe bronchiectasis is unchanged from preoperative imaging. There is no focal right lung airspace disease. There is no right pleural effusion. Minimal hypoventilatory change in the dependent right hemithorax. Upper Abdomen: No acute or  unexpected findings. Musculoskeletal: Postsurgical change in the left chest wall with scattered air and edema in the musculature and soft tissues. No peripherally enhancing fluid collection to suggest abscess. No focal bone lesion. Review of the MIP images confirms the above findings. IMPRESSION: 1. No pulmonary embolus. 2. Postoperative changes at the left hilum post lower lobectomy. Left hydropneumothorax, with moderate air component anteriorly. Left pleural fluid is partially loculated tracking at the apex superiorly, and at the left lung base. No definite pleural enhancement. 3. Occasional atelectasis throughout the left upper lobe. Minimal ground-glass opacity in the perihilar region, nonspecific, likely inflammatory/postsurgical. 4. Prominent lower right hilar node is likely reactive. 5. Postsurgical change in the left chest wall with scattered air and edema in the musculature and soft tissues. No peripherally enhancing fluid collection  to suggest abscess. Electronically Signed   By: Narda Rutherford M.D.   On: 11/20/2019 18:59    Procedures Procedures (including critical care time)  Medications Ordered in ED Medications  oxyCODONE-acetaminophen (PERCOCET/ROXICET) 5-325 MG per tablet 1 tablet (1 tablet Oral Given 11/20/19 2059)  oxyCODONE-acetaminophen (PERCOCET/ROXICET) 5-325 MG per tablet 2 tablet (2 tablets Oral Given 11/20/19 1702)  iohexol (OMNIPAQUE) 350 MG/ML injection 100 mL (100 mLs Intravenous Contrast Given 11/20/19 1839)    ED Course  I have reviewed the triage vital signs and the nursing notes.  Pertinent labs & imaging results that were available during my care of the patient were reviewed by me and considered in my medical decision making (see chart for details).    MDM Rules/Calculators/A&P                          EKG findings by my read: Compared to prior: 11/12/2019.  Rate: 103 rhythm: sinus Axis: appropriate  PR: 124 QRS: 84 QTc: 429.  No evidence of ischemia or arrhythmia,  nor any other pathologic findings concerning considering patient presentation. Findings discussed with attending who agrees.  Differential diagnosis considered: Pneumothorax, pneumonia, surgical infection, ACS, PE  Patient presents to ED with sharp chest pain shortness of breath few days postop from a lobectomy. Less obtained from triage nurse unremarkable the patient does endorse subjective chills however is not febrile.  He does get quite tachypneic and is obviously short of breath with some exertion though at rest appears to be grossly okay and is satting appropriately.  Chest x-ray showing worsening airspace disease with a persistent left hydropneumothorax, will obtain cross-sectional imaging to better characterize this.  CT showing moderate left pneumothorax with loculated pleural effusion, no obvious evidence of abscess  CT surgery consulted for recommendations considering substantial pneumothorax in the postoperative period and this loculated effusion. Case discussed with physician on-call who felt that patient would benefit from a percutaneous chest tube and would likely require admission, may require TPA dornase and serial chest x-rays.  CT surgery agreeing initially to admit the patient to their service.  However on evaluation per Dr. Renaldo Fiddler he feels that the patient may be safely managed in the outpatient setting, counseled to call the office tomorrow, continue take pain medicines as previously prescribed, strict return precautions stressed between Dr. Vickey Sages and the patient  Labs reviewed and interpreted by myself with significant findings above. Imaging reviewed by myself and interpreted by radiologist.  Case and plan above discussed with my attending Dr. Anitra Lauth   Final Clinical Impression(s) / ED Diagnoses Final diagnoses:  Hydropneumothorax    Rx / DC Orders ED Discharge Orders    None     Labs, studies and imaging reviewed by myself and considered in medical  decision making if ordered. Imaging interpreted by radiology. Pt was discussed with my attending, Dr. Anitra Lauth  Electronically signed by:  Christiane Ha Redding9/9/202111:11 PM       Loree Fee, MD 11/20/19 2115    Loree Fee, MD 11/20/19 1027    Gwyneth Sprout, MD 11/20/19 2536

## 2019-11-20 NOTE — ED Triage Notes (Signed)
Pt states he was discharged from hospital 3 days ago after having left lower lobectomy.  Reports SOB, L sided chest pain, and fast HR since yesterday.  States pain initially improved but then got worse.  Pt using incentive spirometer on arrival to ED.

## 2019-11-20 NOTE — ED Notes (Addendum)
Pt reports worsening chest pain and SOB.  Pt diaphoretic. Brought to triage for repeat EKG and Trop.  Charge RN notified pt next for treatment room.

## 2019-11-21 ENCOUNTER — Telehealth: Payer: Self-pay

## 2019-11-21 ENCOUNTER — Encounter: Payer: Self-pay | Admitting: Thoracic Surgery (Cardiothoracic Vascular Surgery)

## 2019-11-21 ENCOUNTER — Ambulatory Visit (INDEPENDENT_AMBULATORY_CARE_PROVIDER_SITE_OTHER): Payer: Self-pay | Admitting: Thoracic Surgery (Cardiothoracic Vascular Surgery)

## 2019-11-21 VITALS — BP 145/82 | HR 85 | Temp 98.1°F | Resp 20 | Ht 68.0 in | Wt 189.0 lb

## 2019-11-21 DIAGNOSIS — Z09 Encounter for follow-up examination after completed treatment for conditions other than malignant neoplasm: Secondary | ICD-10-CM

## 2019-11-21 DIAGNOSIS — B449 Aspergillosis, unspecified: Secondary | ICD-10-CM

## 2019-11-21 MED ORDER — GABAPENTIN 300 MG PO CAPS: ORAL_CAPSULE | ORAL | 1 refills | Status: DC

## 2019-11-21 NOTE — Progress Notes (Signed)
[address]       [place] [address]             [phone]     HPI: [name] returns for follow-up due to postoperative pain.  Eduardo Moore is a 39 year old man who presented with wheezing and hemoptysis.  He was ultimately found to have an aspergilloma in the left lower lobe.  He also had multiple other cavitary lesions in the left lower lobe.  I did a robotic left lower lobectomy on him on 11/14/2019.  He was discharged on 11/18/2019.  He was having a lot of incisional pain after discharge.  He had called in least 1 occasion.  Yesterday he woke up from a nap and was in severe pain and felt his heart beating very strongly.  He thought he was having a heart attack and went to the emergency room.  He ruled out for MI and a CT angio showed no evidence of PE.  His chest x-ray and CT did show a postoperative space and some fluid.  His oxygen saturation was under percent on room air and he had no fever or white blood cell count.  He was discharged home.  Today he feels better.  He still says he is having 7 out of 10 pain.  Mostly towards the back along the area of the incision.  He does state this has kind of a tingling character.  He also complains of some gas and distention in his abdomen.  He has had a bowel movement.  So far today he is taken Tylenol twice and took 10 mg of oxycodone 1 time around noon.  Past Medical History:  Diagnosis Date  . Anxiety   . Depression   . Dyspnea    when walking  . GERD (gastroesophageal reflux disease)     Current Outpatient Medications  Medication Sig Dispense Refill  . acetaminophen (TYLENOL) 500 MG tablet Take 500 mg by mouth every 6 (six) hours as needed for mild pain.    Marland Kitchen albuterol (PROVENTIL) (2.5 MG/3ML) 0.083% nebulizer solution Take 2.5 mg by nebulization 2 (two) times daily as needed for wheezing or shortness of breath.    Marland Kitchen albuterol (VENTOLIN HFA) 108 (90 Base) MCG/ACT inhaler Inhale 2 puffs into the  lungs every 6 (six) hours as needed for wheezing or shortness of breath.    . fluticasone furoate-vilanterol (BREO ELLIPTA) 200-25 MCG/INH AEPB Inhale 1 puff into the lungs daily. 60 each 11  . omeprazole (PRILOSEC) 20 MG capsule Take 1 capsule (20 mg total) by mouth 2 (two) times daily before a meal. 60 capsule 5  . oxyCODONE (OXY IR/ROXICODONE) 5 MG immediate release tablet Take 1-2 tablets (5-10 mg total) by mouth every 4 (four) hours as needed for up to 7 days for moderate pain. 60 tablet 0  . gabapentin (NEURONTIN) 300 MG capsule Take 1 capsule (300 mg total) by mouth once a week for 1 day, THEN 1 capsule (300 mg total) 2 (two) times daily for 1 day, THEN 1 capsule (300 mg total) 3 (three) times daily. 90 capsule 1  . ondansetron (ZOFRAN) 4 MG tablet Take 1 tablet (4 mg total) by mouth every 8 (eight) hours as needed for nausea or vomiting. (Patient not taking: Reported on 11/21/2019) 30 tablet 5   No current facility-administered medications for this visit.    Physical Exam BP (!) 145/82   Pulse 85   Temp 98.1 F (36.7 C) (  Skin)   Resp 20   Ht 5\' 8"  (1.727 m)   Wt 189 lb (85.7 kg)   SpO2 98% Comment: RA  BMI 28.74 kg/m  Well-appearing, mildly anxious 39 year old man in no acute distress Incisions healing well Diminished breath sounds on left  Diagnostic Tests: I reviewed the chest x-ray and CT angiogram from yesterday.  Findings as noted in HPI  Impression: Eduardo Moore is a 39 year old gentleman with a history of anxiety, depression, and reflux.  He presented with hemoptysis and shortness of breath and wheezing.  He ultimately was diagnosed with an aspergilloma.  He underwent a robotic left lower lobectomy week ago.  Overall he is doing fine from the surgery.  He is having significant incisional pain.  I think there is an anxiety component to this as well.  I debated whether to add Xanax or gabapentin.  Since there is a bit of a tingling quality to the pain I elected to  start gabapentin.  I went over schedule for his medications and also printed that out for him so that he can refer to that as needed.  Plan: Continue Tylenol 500 mg, 2 tablets 4 times daily Continue oxycodone 5 to 10 mg p.o. every 6 hours as needed Will add gabapentin 300 mg 3 times daily.  We will start with 1 dose today and do 2 doses tomorrow before 3 times daily beginning on Sunday. Return as scheduled on Tuesday, 11/25/2019.  11/27/2019, MD [facility] [phone]

## 2019-11-21 NOTE — Patient Instructions (Signed)
1. You may use acetaminophen (Tylenol) extra strength (500 mg tablets or capsules)- Take two 4 times daily. DO NOT EXCEED 8 tablets or capsules (4000 mg) in 24 hours  2. Gabapentin- take one tablet once the first day, one tablet twice a day on the 2nd day and then one tablet 3 times daily.  3. Oxycodone- 5 mg tablets. A narcotic. Can use 1 or two tablets up to 4 ties a day as needed   4. For "gas" you may use an over the counter antacid of your choice, such as Mylanta, Maalox or Riopan.

## 2019-11-21 NOTE — Telephone Encounter (Signed)
-----   Message from Loreli Slot, MD sent at 11/21/2019  7:19 AM EDT ----- Regarding: RE: FYI/going to ED Next time please call me or have him come to the office to be evaluated [facility] ----- Message ----- From: Joycelyn Schmid, LPN Sent: 10/16/275  12:50 PM EDT To: Leary Roca, PA-C, # Subject: FYI/going to ED                                Mr Roxanna Mew call after hours yesterday. C/O SOB,Sweating and chest pressure. He called 911 and EMS checked him out. They told him his heart rate was very high. He doesn't no the # This AM he is still sweating with chest pain. I told him to go the [facility]/ED for eval.  He did reach out to his PCP and they told him they could not see him today.

## 2019-11-24 ENCOUNTER — Other Ambulatory Visit: Payer: Self-pay | Admitting: Thoracic Surgery (Cardiothoracic Vascular Surgery)

## 2019-11-24 DIAGNOSIS — J939 Pneumothorax, unspecified: Secondary | ICD-10-CM

## 2019-11-25 ENCOUNTER — Ambulatory Visit (INDEPENDENT_AMBULATORY_CARE_PROVIDER_SITE_OTHER): Payer: Self-pay | Admitting: Internal Medicine

## 2019-11-25 ENCOUNTER — Other Ambulatory Visit: Payer: Self-pay

## 2019-11-25 ENCOUNTER — Ambulatory Visit
Admission: RE | Admit: 2019-11-25 | Discharge: 2019-11-25 | Disposition: A | Discharge status: Home / Self Care | Payer: Self-pay | Source: Ambulatory Visit | Attending: Thoracic Surgery (Cardiothoracic Vascular Surgery) | Admitting: Thoracic Surgery (Cardiothoracic Vascular Surgery)

## 2019-11-25 ENCOUNTER — Ambulatory Visit (INDEPENDENT_AMBULATORY_CARE_PROVIDER_SITE_OTHER): Payer: Self-pay | Admitting: Thoracic Surgery (Cardiothoracic Vascular Surgery)

## 2019-11-25 VITALS — BP 144/85 | HR 77 | Temp 97.7°F | Resp 20 | Ht 68.0 in | Wt 189.0 lb

## 2019-11-25 DIAGNOSIS — J939 Pneumothorax, unspecified: Secondary | ICD-10-CM

## 2019-11-25 DIAGNOSIS — Z09 Encounter for follow-up examination after completed treatment for conditions other than malignant neoplasm: Secondary | ICD-10-CM

## 2019-11-25 DIAGNOSIS — B449 Aspergillosis, unspecified: Secondary | ICD-10-CM

## 2019-11-25 MED ORDER — ALPRAZOLAM 0.25 MG PO TABS: 0.2500 mg | ORAL_TABLET | Freq: Two times a day (BID) | ORAL | 0 refills | Status: DC | PRN

## 2019-11-25 NOTE — Progress Notes (Signed)
[address]       Eduardo Moore [address]             [phone]      HPI: [name] returns for a scheduled follow-up visit  Eduardo Moore is a 39 year old man who presented with wheezing and hemoptysis.  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  There also were multiple other cavitary lesions in the lower lobe.  I did a robotic left lower lobectomy on 11/14/2019.  He went home on day 4.  He was having a lot of incisional pain.  He ended up back in the emergency room where he ruled out for an MI.  He also had a CT which showed no evidence of PE.  He did have a space and some effusion post lobectomy.  I saw him in the office on 11/21/2019.  We started him on gabapentin in addition to regular Tylenol and as needed oxycodone.  He does still have incisional pain.  He says he has not taken oxycodone in about 2 days but will probably need to take some this morning.  He is taking the gabapentin as scheduled.  He complains of shortness of breath with activity and anxiety.  He still feels like his heart is beating strongly.  Past Medical History:  Diagnosis Date  . Anxiety   . Depression   . Dyspnea    when walking  . GERD (gastroesophageal reflux disease)     Current Outpatient Medications  Medication Sig Dispense Refill  . acetaminophen (TYLENOL) 500 MG tablet Take 500 mg by mouth every 6 (six) hours as needed for mild pain.    Marland Kitchen albuterol (PROVENTIL) (2.5 MG/3ML) 0.083% nebulizer solution Take 2.5 mg by nebulization 2 (two) times daily as needed for wheezing or shortness of breath.    Marland Kitchen albuterol (VENTOLIN HFA) 108 (90 Base) MCG/ACT inhaler Inhale 2 puffs into the lungs every 6 (six) hours as needed for wheezing or shortness of breath.    . fluticasone furoate-vilanterol (BREO ELLIPTA) 200-25 MCG/INH AEPB Inhale 1 puff into the lungs daily. 60 each 11  . gabapentin (NEURONTIN) 300 MG capsule Take 1 capsule (300 mg total) by mouth  once a week for 1 day, THEN 1 capsule (300 mg total) 2 (two) times daily for 1 day, THEN 1 capsule (300 mg total) 3 (three) times daily. 90 capsule 1  . omeprazole (PRILOSEC) 20 MG capsule Take 1 capsule (20 mg total) by mouth 2 (two) times daily before a meal. 60 capsule 5  . ondansetron (ZOFRAN) 4 MG tablet Take 1 tablet (4 mg total) by mouth every 8 (eight) hours as needed for nausea or vomiting. 30 tablet 5  . oxyCODONE (OXY IR/ROXICODONE) 5 MG immediate release tablet Take 1-2 tablets (5-10 mg total) by mouth every 4 (four) hours as needed for up to 7 days for moderate pain. 60 tablet 0  . ALPRAZolam (XANAX) 0.25 MG tablet Take 1 tablet (0.25 mg total) by mouth 2 (two) times daily as needed for anxiety. 30 tablet 0   No current facility-administered medications for this visit.    Physical Exam BP (!) 144/85   Pulse 77   Temp 97.7 F (36.5 C) (Skin)   Resp 20   Ht 5\' 8"  (1.727 m)   Wt 189 lb (85.7 kg)   SpO2 96% Comment: RA  BMI 28.28 kg/m  39 year old man in no acute distress Alert and oriented  x3 with no focal deficits Lungs diminished at left base otherwise clear Incisions healing well Cardiac regular rate and rhythm with normal S1 and S2 No peripheral edema  Diagnostic Tests: CHEST - 2 VIEW  COMPARISON:  11/20/2018  FINDINGS: Left apical pneumothorax again noted, stable. Small left pleural effusion with left base atelectasis. Right lung clear. Heart is normal size.  IMPRESSION: Stable small left apical pneumothorax. Small left effusion with left base atelectasis.   Electronically Signed   By: Charlett Nose M.D.   On: 11/25/2019 09:38 I personally reviewed the chest x-ray and concur with the findings noted above  Impression: Eduardo Moore is a 39 year old man who had a robotic left lower lobectomy for an aspergilloma with hemoptysis on 11/14/2019.  He was having a lot of pain last week and we started him on gabapentin addition to Tylenol and  oxycodone.  His pain is greatly improved.  He is taking deeper breaths and is moving much more easily than he was on Friday.  He is able to lift his left arm without hesitation.  I again went over the plan which would be to take gabapentin as scheduled.  He should not stop the gabapentin suddenly without discussing with me.  Continue to take Tylenol on a regular basis.  Continue to use oxycodone as needed but we want to wean off that over time.  He continues to have a great deal of anxiety.  This predated his surgery and he has only gotten more anxious since the surgery.  I am going to start him on low-dose Xanax 0.25 mg twice daily as needed to see if that helps.  He should not drive yet.  He should avoid lifting anything over 20 pounds.  I did encourage him to walk on a regular basis.  I want him to start slowly and gradually build up over time.  Plan: Xanax 0.25 mg p.o. twice daily as needed anxiety, 30 tablets, no refills Continue gabapentin and Tylenol Continue as needed oxycodone Return in 2 weeks with chest x-ray  Loreli Slot, MD [facility] [phone]

## 2019-11-26 NOTE — Progress Notes (Signed)
Interstitial Lung Disease Multidisciplinary Conference   Jaliel Deavers    MRN [id number]    DOB [birth year]  Primary Care Physician:Wright, Burnett Harry, MD  Referring Physician:  DR  Brand Males  Time of Conference: 7.30am- 8.30am Date of conference: 11/25/19 Location of Conference: -  Virtual  Participating Pulmonary: Dr. Brand Males, MD - yes,  Dr Marshell Garfinkel, MD - yes Pathology: Dr Jaquita Folds, MD - yes , Dr Enid Cutter - no Radiology: Dr Salvatore Marvel MD - no, Dr Vinnie Langton MD - no,  Dr Lorin Picket, MD - no , Dr Eddie Candle - yes Others: no  Brief History: He came with asthma symptoms and wheezing and later hemoptysis. He is in 1s . referred to [facility] for researction but he changed his mind and just did bx. Shows aspergilloma. For educational reasons -want to see aspergilloma slide and CT  Serology:negative including normal IgE  MDD discussion of CT scan     - ct discussion - superior segment lower lobe -> Rounded masses like cavitary lesion with crescent sign - classic for aspergilloma with halo and contralateral cystic. 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     Pathology discussion of biopsy sept 2021 - aspergilloma - inflamed lung parenchyma. High power - fungal hyphae with acute angle branching 9/3.   PFTs: x  Labs: x  MDD Impression/Recs: some kind of cystic lung in superior segment lower lobes bilaterally. 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 Will need to get him back for followup   Time Spent in preparation and discussion:  > 30 min    SIGNATURE   Dr. Brand Males, M.D., F.C.C.P,  Pulmonary and Critical Care Medicine Staff Physician, [place] Director - Interstitial Lung Disease  Program  Pulmonary [place] at [place], [place], [address]  Pager: [phone], If no answer or between  15:00h - 7:00h: call [phone] Telephone: [phone]  3:53 PM 11/26/2019 ...................................................................................................................Marland Kitchen References: Diagnosis of Hypersensitivity Pneumonitis in Adults. An Official ATS/JRS/ALAT Clinical Practice Guideline. Ragu G et al, Junior Aug 1;202(3):e36-e69.       Diagnosis of Idiopathic Pulmonary Fibrosis. An Official ATS/ERS/JRS/ALAT Clinical Practice Guideline. Raghu G et al, [place]. 2018 Sep 1;198(5):e44-e68.   IPF Suspected   Histopath ology Pattern      UIP  Probable UIP  Indeterminate for  UIP  Alternative  diagnosis    UIP  IPF  IPF  IPF  Non-IPF dx   HRCT   Probabe UIP  IPF  IPF  IPF (Likely)**  Non-IPF dx  Pattern  Indeterminate for UIP  IPF  IPF (Likely)**  Indeterminate  for IPF**  Non-IPF dx    Alternative diagnosis  IPF (Likely)**/ non-IPF dx  Non-IPF dx  Non-IPF dx  Non-IPF dx     Idiopathic pulmonary fibrosis diagnosis based upon HRCT and Biopsy paterns.  ** IPF is the likely diagnosis when any of following features are present:  . Moderate-to-severe traction bronchiectasis/bronchiolectasis (defined as mild traction bronchiectasis/bronchiolectasis in four or more lobes including the lingual as a lobe, or moderate to severe traction bronchiectasis in two or more lobes) in a man over age 2 years or in a woman over age 90 years . Extensive (>30%) reticulation on HRCT and an age >70 years  . Increased neutrophils and/or absence of lymphocytosis in BAL fluid  . Multidisciplinary discussion reaches  a confident diagnosis of IPF.   **Indeterminate for IPF  . Without an adequate biopsy is unlikely to be IPF  . With an adequate biopsy may be reclassified to a more specific diagnosis after multidisciplinary discussion and/or additional consultation.   dx = diagnosis; HRCT = high-resolution computed tomography; IPF = idiopathic pulmonary fibrosis; UIP =  usual interstitial pneumonia.

## 2019-12-05 ENCOUNTER — Other Ambulatory Visit: Payer: Self-pay

## 2019-12-05 ENCOUNTER — Encounter: Payer: Self-pay | Admitting: Family Medicine

## 2019-12-05 ENCOUNTER — Ambulatory Visit: Payer: Self-pay | Attending: Family Medicine | Admitting: Family Medicine

## 2019-12-05 VITALS — BP 121/73 | HR 82 | Ht 68.0 in | Wt 196.4 lb

## 2019-12-05 DIAGNOSIS — K5903 Drug induced constipation: Secondary | ICD-10-CM

## 2019-12-05 DIAGNOSIS — R110 Nausea: Secondary | ICD-10-CM

## 2019-12-05 DIAGNOSIS — Z09 Encounter for follow-up examination after completed treatment for conditions other than malignant neoplasm: Secondary | ICD-10-CM

## 2019-12-05 DIAGNOSIS — J95811 Postprocedural pneumothorax: Secondary | ICD-10-CM

## 2019-12-05 DIAGNOSIS — R0789 Other chest pain: Secondary | ICD-10-CM

## 2019-12-05 DIAGNOSIS — R1084 Generalized abdominal pain: Secondary | ICD-10-CM

## 2019-12-05 DIAGNOSIS — Z9889 Other specified postprocedural states: Secondary | ICD-10-CM

## 2019-12-05 DIAGNOSIS — Z758 Other problems related to medical facilities and other health care: Secondary | ICD-10-CM

## 2019-12-05 DIAGNOSIS — G8918 Other acute postprocedural pain: Secondary | ICD-10-CM

## 2019-12-05 DIAGNOSIS — Z789 Other specified health status: Secondary | ICD-10-CM

## 2019-12-05 DIAGNOSIS — Z603 Acculturation difficulty: Secondary | ICD-10-CM

## 2019-12-05 DIAGNOSIS — B449 Aspergillosis, unspecified: Secondary | ICD-10-CM

## 2019-12-05 MED ORDER — OMEPRAZOLE 40 MG PO CPDR: 40.0000 mg | DELAYED_RELEASE_CAPSULE | Freq: Every day | ORAL | 2 refills | Status: DC

## 2019-12-05 MED ORDER — ONDANSETRON HCL 4 MG PO TABS: 4.0000 mg | ORAL_TABLET | Freq: Three times a day (TID) | ORAL | 5 refills | Status: DC | PRN

## 2019-12-05 MED ORDER — POLYETHYLENE GLYCOL 3350 17 G PO PACK: 17.0000 g | PACK | Freq: Every day | ORAL | 0 refills | Status: DC

## 2019-12-05 MED FILL — OMEPRAZOLE DR 40 MG CAPSULE: 40 | 30 days supply | Qty: 60 | Fill #0

## 2019-12-05 MED FILL — ONDANSETRON HCL 4 MG TABLET: 4 | 10 days supply | Qty: 30 | Fill #0

## 2019-12-05 NOTE — Progress Notes (Signed)
Patient ID: Eduardo Moore, male    DOB: [birth year]  MRN: [id number]   SUBJECTIVE:  Eduardo Moore is a 39 y.o. male who presents for hospital f/u. Where: [facility] When: 11/14/2019 through 11/18/2019 Primary Dx: Aspergilloma left lower lung lobe, status post robot-assisted left lower lobectomy of lung; depression  ED visit: 11/20/2019 due to left-sided chest pain.  Diagnosis of hydropneumothorax for which patient received pain medication.  HPI:      39 year old male seen in follow-up of recent hospitalization for treatment of left lower lobe aspergilloma causing hemoptysis for which patient had robot-assisted left lower lobe lobectomy. He reports that since his surgery, he has had no further issues with productive cough and no coughing or spitting up blood. He continues to feel short of breath. He is trying to walk for exercise. He reports having some mild cramping in his lower legs, right greater than left after walking. He has had no lower extremity swelling. Leg pain is mild and improves with rest. He denies any issues with fever or chills. He continues to have some left lower chest/left abdominal discomfort in the site where the surgery was done. He continues to take opioid pain medication prescribed by his surgeon. He reports that he has had issues with constipation. He did have a bowel movement this morning. He denies any blood in the stool and no black stools. He continues to feel fatigued since his surgery. He did go to the emergency department on 11/20/2019 due to left-sided chest pain and had blood work and imaging. He reports that the chest pain is improved and is controlled with the use of medication. He continues to have issues with nausea. He is taking the omeprazole 20 mg twice daily prescribed at the time of hospital discharge. He does have some medication for nausea which helps but he would like a refill of the medication. He feels that his appetite is  returning.   Patient Active Problem List   Diagnosis Date Noted  . Pneumothorax 11/20/2019  . Status post robot-assisted surgical procedure 11/14/2019  . S/P robot-assisted surgical procedure 11/14/2019  . Depression 11/11/2019  . Hemoptysis 11/11/2019  . Anxiety about health 11/03/2019  . Aspergilloma (HCC) 10/22/2019     Current Outpatient Medications on File Prior to Visit  Medication Sig Dispense Refill  . acetaminophen (TYLENOL) 500 MG tablet Take 500 mg by mouth every 6 (six) hours as needed for mild pain.    Marland Kitchen albuterol (PROVENTIL) (2.5 MG/3ML) 0.083% nebulizer solution Take 2.5 mg by nebulization 2 (two) times daily as needed for wheezing or shortness of breath.    Marland Kitchen albuterol (VENTOLIN HFA) 108 (90 Base) MCG/ACT inhaler Inhale 2 puffs into the lungs every 6 (six) hours as needed for wheezing or shortness of breath.    . ALPRAZolam (XANAX) 0.25 MG tablet Take 1 tablet (0.25 mg total) by mouth 2 (two) times daily as needed for anxiety. 30 tablet 0  . fluticasone furoate-vilanterol (BREO ELLIPTA) 200-25 MCG/INH AEPB Inhale 1 puff into the lungs daily. 60 each 11  . gabapentin (NEURONTIN) 300 MG capsule Take 1 capsule (300 mg total) by mouth once a week for 1 day, THEN 1 capsule (300 mg total) 2 (two) times daily for 1 day, THEN 1 capsule (300 mg total) 3 (three) times daily. 90 capsule 1  . omeprazole (PRILOSEC) 20 MG capsule Take 1 capsule (20 mg total) by mouth 2 (two) times daily before a meal. 60 capsule 5  .  ondansetron (ZOFRAN) 4 MG tablet Take 1 tablet (4 mg total) by mouth every 8 (eight) hours as needed for nausea or vomiting. 30 tablet 5   No current facility-administered medications on file prior to visit.    Allergies  Allergen Reactions  . Lentil Shortness Of Breath and Rash    lentils  . Pea Shortness Of Breath and Rash    Green Peas    Social History   Tobacco Use  . Smoking status: Never Smoker  . Smokeless tobacco: Never Used  Vaping Use  . Vaping Use:  Never used  Substance Use Topics  . Alcohol use: Yes    Comment: occasional  . Drug use: Never     Family History  Problem Relation Age of Onset  . Asthma Mother     Past Surgical History:  Procedure Laterality Date  . INTERCOSTAL NERVE BLOCK Left 11/14/2019   Procedure: INTERCOSTAL NERVE BLOCK;  Surgeon: Loreli Slot, MD;  Location: [facility] OR;  Service: Thoracic;  Laterality: Left;  . LYMPH NODE DISSECTION N/A 11/14/2019   Procedure: LYMPH NODE DISSECTION;  Surgeon: Loreli Slot, MD;  Location: [facility] OR;  Service: Thoracic;  Laterality: N/A;  . VIDEO BRONCHOSCOPY WITH ENDOBRONCHIAL NAVIGATION N/A 10/02/2019   Procedure: VIDEO BRONCHOSCOPY WITH ENDOBRONCHIAL NAVIGATION;  Surgeon: Loreli Slot, MD;  Location: [facility] OR;  Service: Thoracic;  Laterality: N/A;    ROS: Review of Systems  Constitutional: Positive for fatigue. Negative for chills and fever.  HENT: Negative for sore throat and trouble swallowing.   Respiratory: Positive for shortness of breath (with activity). Negative for cough.   Cardiovascular: Negative for chest pain and palpitations.  Gastrointestinal: Positive for abdominal pain and constipation. Negative for blood in stool, diarrhea and nausea.  Endocrine: Negative for polydipsia, polyphagia and polyuria.  Genitourinary: Negative for dysuria, flank pain and hematuria.  Musculoskeletal: Positive for arthralgias (chest wall pain) and myalgias.  Skin: Positive for wound (healing areas from recent surgery). Negative for rash.  Neurological: Negative for dizziness and headaches.  Hematological: Negative for adenopathy. Does not bruise/bleed easily.  Psychiatric/Behavioral: Negative for suicidal ideas. The patient is nervous/anxious.      PHYSICAL EXAM: BP 121/73   Pulse 82   Ht 5\' 8"  (1.727 m)   Wt 196 lb 6.4 oz (89.1 kg)   SpO2 99%   BMI 29.86 kg/m    Physical Exam Vitals and nursing note reviewed.  Constitutional:      Appearance: Normal  appearance. He is normal weight.     Comments: WNWD male in NAD who appears to be fatigued, speaks in a soft hoarse voice.  Cardiovascular:     Rate and Rhythm: Normal rate and regular rhythm.  Pulmonary:     Effort: Pulmonary effort is normal.     Breath sounds: Normal breath sounds. No wheezing or rhonchi.     Comments: Decreased breath sounds in right lower lung field and absent lung noise in left lower lung fields Abdominal:     Palpations: Abdomen is soft.     Tenderness: There is abdominal tenderness (mild discomfort across lower abdomen and left mid to lower abdomen). There is no right CVA tenderness, left CVA tenderness, guarding or rebound.  Musculoskeletal:        General: Tenderness (chest wall tenderness inferior, anteriolateral  ) present.     Cervical back: Normal range of motion and neck supple. No tenderness.     Right lower leg: No edema.     Left lower  leg: No edema.  Lymphadenopathy:     Cervical: No cervical adenopathy.  Skin:    Findings: No bruising or erythema.     Comments: No evidence of infection at port sites for recent surgery  Neurological:     General: No focal deficit present.     Mental Status: He is alert and oriented to person, place, and time.  Psychiatric:        Mood and Affect: Mood normal.        Behavior: Behavior normal.     Comments: Slightly anxious mood       ASSESSMENT AND PLAN: 1. Hospital discharge follow-up; 2. Aspergilloma; 3. S/p robot-assisted procedure-left lower lobectomy; 4. Postprocedural pneumothorax; 5. Chest wall pain following surgery Records from patient's hospitalization from 11/14/2019- 11/18/2019 reviewed as well as ED notes from 11/20/2019 reviewed and discussed with the patient. He continues to have some SOB but no cough or hemoptysis. Oxygen sat on room air was 99%. His post-hospitalization surgery follow-up note was also reviewed. He feels that he is slowly improving. Will have patient scheduled for follow-up with  pulmonologist at this office hopefully within the next two weeks. Keep upcoming appointment with surgery. CBC and CMET in follow-up. Continue to wear a mask when outside of the home to decrease risk of pulmonary infection. Continue to walk for exercise as tolerated. Continue medications as per hospital discharge but omeprazole dose increased due to patient's continued nausea. He will also receive Miralax samples to help with constipation which is likely opioid induced.  - CBC with Differential - Comprehensive metabolic panel  6. Nausea He is encouraged to treat current constipation which may be contributing to nausea. Refill provided for zofran to help with nausea and discussed that nausea may also be related to increased acid production as a stress reaction from recent illness and hospitalization. Opioid therapy can also cause nausea. Will check CMET for electrolyte or liver enzyme abnormality which may be contributing to his nausea. RX also provided for omeprazole to help reduce stomach acid dose increased.  - Comprehensive metabolic panel - ondansetron (ZOFRAN) 4 MG tablet; Take 1 tablet (4 mg total) by mouth every 8 (eight) hours as needed for nausea or vomiting.  Dispense: 30 tablet; Refill: 5 - omeprazole (PRILOSEC) 40 MG capsule; Take 1 capsule (40 mg total) by mouth twice daily. To reduce stomach acid  Dispense: 60 capsule; Refill: 2, which is increased from his prior 20 mg twice per day.   7. Generalized abdominal pain Abdominal pain is most likely related to current constipation but if will also check CBC for elevated wbc's that might indicate infection or to look for anemia related to blood loss. CMET to look for electrolyte or renal abnormalities which may be a factor in patient's pain. If continued pain he may need to further imaging and work-up. He should also mention abdominal pain at his surgical follow-up if it has not improved as postsurgical adhesions could also be a factor in his  abdominal pain.  - CBC with Differential - Comprehensive metabolic panel  8. Drug-induced constipation Discussed opioid induced constipation and patient can pick up samples of Miralax today and continue to increase water and fiber in the diet.  - polyethylene glycol (MIRALAX) 17 g packet; Take 17 g by mouth daily.  Dispense: 14 each; Refill: 0  9. Language barrier Video interpretation system used at today's visit  Future Appointments  Date Time Provider Department Center  12/09/2019 10:45 AM Loreli Slot, MD [facility]  Antony Blackbird, MD, [facility]

## 2019-12-05 NOTE — Progress Notes (Signed)
Having nausea in the mornings  Having pain in left side of abdomen(surgery site)  Wants to be checked for diabetes.

## 2019-12-06 LAB — COMPREHENSIVE METABOLIC PANEL WITH GFR
ALT: 43 IU/L (ref 0–44)
AST: 14 IU/L (ref 0–40)
Albumin/Globulin Ratio: 1.8 (ref 1.2–2.2)
Albumin: 4.6 g/dL (ref 4.0–5.0)
Alkaline Phosphatase: 118 IU/L (ref 44–121)
BUN/Creatinine Ratio: 11 (ref 9–20)
BUN: 9 mg/dL (ref 6–24)
Bilirubin Total: 0.6 mg/dL (ref 0.0–1.2)
CO2: 27 mmol/L (ref 20–29)
Calcium: 10.1 mg/dL (ref 8.7–10.2)
Chloride: 100 mmol/L (ref 96–106)
Creatinine, Ser: 0.81 mg/dL (ref 0.76–1.27)
GFR calc Af Amer: 129 mL/min/1.73
GFR calc non Af Amer: 111 mL/min/1.73
Globulin, Total: 2.6 g/dL (ref 1.5–4.5)
Glucose: 86 mg/dL (ref 65–99)
Potassium: 4.2 mmol/L (ref 3.5–5.2)
Sodium: 139 mmol/L (ref 134–144)
Total Protein: 7.2 g/dL (ref 6.0–8.5)

## 2019-12-06 LAB — CBC WITH DIFFERENTIAL/PLATELET
Basophils Absolute: 0 x10E3/uL (ref 0.0–0.2)
Basos: 1 %
EOS (ABSOLUTE): 0.4 x10E3/uL (ref 0.0–0.4)
Eos: 4 %
Hematocrit: 40.1 % (ref 37.5–51.0)
Hemoglobin: 14 g/dL (ref 13.0–17.7)
Immature Grans (Abs): 0 x10E3/uL (ref 0.0–0.1)
Immature Granulocytes: 1 %
Lymphocytes Absolute: 1.9 x10E3/uL (ref 0.7–3.1)
Lymphs: 22 %
MCH: 30.5 pg (ref 26.6–33.0)
MCHC: 34.9 g/dL (ref 31.5–35.7)
MCV: 87 fL (ref 79–97)
Monocytes Absolute: 0.7 x10E3/uL (ref 0.1–0.9)
Monocytes: 8 %
Neutrophils Absolute: 5.6 x10E3/uL (ref 1.4–7.0)
Neutrophils: 64 %
Platelets: 312 x10E3/uL (ref 150–450)
RBC: 4.59 x10E6/uL (ref 4.14–5.80)
RDW: 12.5 % (ref 11.6–15.4)
WBC: 8.7 x10E3/uL (ref 3.4–10.8)

## 2019-12-06 MED ORDER — OMEPRAZOLE 40 MG PO CPDR: 40.0000 mg | DELAYED_RELEASE_CAPSULE | Freq: Two times a day (BID) | ORAL | 2 refills | Status: DC

## 2019-12-08 ENCOUNTER — Telehealth: Payer: Self-pay | Admitting: Critical Care Medicine

## 2019-12-08 ENCOUNTER — Telehealth: Payer: Self-pay

## 2019-12-08 ENCOUNTER — Other Ambulatory Visit: Payer: Self-pay

## 2019-12-08 ENCOUNTER — Ambulatory Visit: Payer: Self-pay

## 2019-12-08 ENCOUNTER — Other Ambulatory Visit: Payer: Self-pay | Admitting: Thoracic Surgery (Cardiothoracic Vascular Surgery)

## 2019-12-08 DIAGNOSIS — J939 Pneumothorax, unspecified: Secondary | ICD-10-CM

## 2019-12-08 DIAGNOSIS — Z20822 Contact with and (suspected) exposure to covid-19: Secondary | ICD-10-CM

## 2019-12-08 NOTE — Telephone Encounter (Signed)
I am willing to see this patient tomorrow afternoon and he can be covid tested at same time   pls let me know if we can see him face to face at [facility]    Suggest late afternoon slot per our covid scheduling

## 2019-12-08 NOTE — Telephone Encounter (Signed)
Patient contacted the office with concerns about symptoms potentially Covid positive.  He is s/p VATS lobectomy with Dr. [name] 11/14/19.  He stated that he was experiencing shortness of breath, cough (that comes and goes), sore throat, and chills.  He states that he is taking over the counter mucinex and robitussin, which are not helping.  Patient stated that he already had an appointment for a COVID test from another physician.  Advised patient to get tested as the symptoms he was experiencing seemed to match COVID symptoms.  Also advised patient that if he was positive, he should contact [facility] outpatient monoclonial antibody clinic hotline to possibly get an infusion because of his surgical history.  Advised patient that if he was negative, he should be seen in the office with a chest xray and to call back.  He acknowledged receipt.

## 2019-12-08 NOTE — Telephone Encounter (Signed)
Pt. Reports he started feeling bad yesterday. Dizziness, cough, shortness of breath, sore throat. Pt. Scheduled for a COVD 19 test today.  Reason for Disposition . [1] MILD dizziness (e.g., walking normally) AND [2] has NOT been evaluated by physician for this  (Exception: dizziness caused by heat exposure, sudden standing, or poor fluid intake)  Answer Assessment - Initial Assessment Questions 1. DESCRIPTION: "Describe your dizziness."     Dizzy 2. LIGHTHEADED: "Do you feel lightheaded?" (e.g., somewhat faint, woozy, weak upon standing)     Yes 3. VERTIGO: "Do you feel like either you or the room is spinning or tilting?" (i.e. vertigo)     No 4. SEVERITY: "How bad is it?"  "Do you feel like you are going to faint?" "Can you stand and walk?"   - MILD: Feels slightly dizzy, but walking normally.   - MODERATE: Feels very unsteady when walking, but not falling; interferes with normal activities (e.g., school, work) .   - SEVERE: Unable to walk without falling, or requires assistance to walk without falling; feels like passing out now.      Mild 5. ONSET:  "When did the dizziness begin?"     Started yesterday 6. AGGRAVATING FACTORS: "Does anything make it worse?" (e.g., standing, change in head position)     When he eats 7. HEART RATE: "Can you tell me your heart rate?" "How many beats in 15 seconds?"  (Note: not all patients can do this)       No 8. CAUSE: "What do you think is causing the dizziness?"     Unsure 9. RECURRENT SYMPTOM: "Have you had dizziness before?" If Yes, ask: "When was the last time?" "What happened that time?"     No 10. OTHER SYMPTOMS: "Do you have any other symptoms?" (e.g., fever, chest pain, vomiting, diarrhea, bleeding)       Nausea 11. PREGNANCY: "Is there any chance you are pregnant?" "When was your last menstrual period?"       n/a  Protocols used: DIZZINESS [facility]

## 2019-12-08 NOTE — Telephone Encounter (Signed)
Patient states he has cough, sore throat, chills and dizzy at times. Sx's onset Saturday. He denies having these symptoms when he was seen at [facility] on Friday, September 24 with Dr. Chapman Fitch.  He states he was advised to get testing for Covid-19 by the Cardiothoracic Surgeon Nurse.  He was informed that he could be given a kit by this office to assist him if he has difficulty with.   He has been taking Robitussin and Mucinex.  Temperature is 98.0. He does not have means to pulse oximeter, only a BP machine.   Advised patient to drink plenty of fluids and get rest. If he experiences SOB, hemoptysis, or worsening dizziness, and sx's to go to the UC or ED for concerns of PE. Advised to call the office that his appt is schedule at tomorrow to inform them he has been tested for Covid and his sx's.   Due to language barrier, an interpreter was used.  Christian with [employer] ID [phone], Reason for encounter--advice only

## 2019-12-08 NOTE — Progress Notes (Signed)
Spanish normal result letter mailed to pt

## 2019-12-09 ENCOUNTER — Other Ambulatory Visit: Payer: Self-pay

## 2019-12-09 ENCOUNTER — Telehealth: Payer: Self-pay

## 2019-12-09 ENCOUNTER — Ambulatory Visit: Payer: Self-pay | Attending: Critical Care Medicine | Admitting: Critical Care Medicine

## 2019-12-09 ENCOUNTER — Encounter: Payer: Self-pay | Admitting: Critical Care Medicine

## 2019-12-09 ENCOUNTER — Ambulatory Visit
Admission: RE | Admit: 2019-12-09 | Discharge: 2019-12-09 | Disposition: A | Discharge status: Home / Self Care | Payer: Self-pay | Source: Ambulatory Visit | Attending: Thoracic Surgery (Cardiothoracic Vascular Surgery) | Admitting: Thoracic Surgery (Cardiothoracic Vascular Surgery)

## 2019-12-09 ENCOUNTER — Ambulatory Visit (INDEPENDENT_AMBULATORY_CARE_PROVIDER_SITE_OTHER): Payer: Self-pay | Admitting: Thoracic Surgery (Cardiothoracic Vascular Surgery)

## 2019-12-09 VITALS — BP 138/79 | HR 92 | Temp 98.3°F | Wt 193.8 lb

## 2019-12-09 VITALS — BP 149/79 | HR 82 | Temp 97.9°F | Resp 20 | Ht 68.0 in | Wt 198.0 lb

## 2019-12-09 DIAGNOSIS — Z9889 Other specified postprocedural states: Secondary | ICD-10-CM

## 2019-12-09 DIAGNOSIS — Z09 Encounter for follow-up examination after completed treatment for conditions other than malignant neoplasm: Secondary | ICD-10-CM

## 2019-12-09 DIAGNOSIS — Z8619 Personal history of other infectious and parasitic diseases: Secondary | ICD-10-CM

## 2019-12-09 DIAGNOSIS — J939 Pneumothorax, unspecified: Secondary | ICD-10-CM

## 2019-12-09 DIAGNOSIS — B449 Aspergillosis, unspecified: Secondary | ICD-10-CM

## 2019-12-09 DIAGNOSIS — J209 Acute bronchitis, unspecified: Secondary | ICD-10-CM | POA: Insufficient documentation

## 2019-12-09 DIAGNOSIS — Z20822 Contact with and (suspected) exposure to covid-19: Secondary | ICD-10-CM

## 2019-12-09 LAB — SARS-COV-2, NAA 2 DAY TAT

## 2019-12-09 LAB — NOVEL CORONAVIRUS, NAA: SARS-CoV-2, NAA: NOT DETECTED

## 2019-12-09 MED ORDER — VITAMIN C 500 MG PO CAPS: ORAL_CAPSULE | ORAL | 0 refills | Status: DC

## 2019-12-09 MED ORDER — VITAMIN D 125 MCG (5000 UT) PO CAPS: ORAL_CAPSULE | ORAL | 0 refills | Status: DC

## 2019-12-09 MED ORDER — ZINC GLUCONATE 50 MG PO TABS: 50.0000 mg | ORAL_TABLET | Freq: Every day | ORAL | 0 refills | Status: DC

## 2019-12-09 MED ORDER — BENZONATATE 200 MG PO CAPS: 200.0000 mg | ORAL_CAPSULE | Freq: Two times a day (BID) | ORAL | 0 refills | Status: DC | PRN

## 2019-12-09 MED ORDER — AZITHROMYCIN 250 MG PO TABS: ORAL_TABLET | ORAL | 0 refills | Status: AC

## 2019-12-09 MED FILL — AZITHROMYCIN 250 MG TABLET: 250 | 5 days supply | Qty: 6 | Fill #0

## 2019-12-09 MED FILL — BENZONATATE 100 MG CAPS: 100 | 10 days supply | Qty: 40 | Fill #0

## 2019-12-09 NOTE — Assessment & Plan Note (Signed)
History of aspergilloma left lower lobe status post resection

## 2019-12-09 NOTE — Telephone Encounter (Signed)
Called pt made aware of scheduled appt for today at 3:30 per our COVID scheduling.  Instructed to wear a mask and follow  CDC guidelines. Verbalized understanding

## 2019-12-09 NOTE — Assessment & Plan Note (Addendum)
Need to rule out Covid which could be likely however his Covid result is still pending  We will treat him expectantly in this way by asking him to take vitamin C vitamin D and zinc  Chest x-ray today was negative  We will also treat with azithromycin given the fact that he may not have Covid and just may simply be tracheobronchitis we will also give benzonatate for cough suppression  the patient also has an albuterol inhaler he will continue using this

## 2019-12-09 NOTE — Patient Instructions (Signed)
Start Vitamin C 500mg  daily, Zinc 50mg  daily, Vitamin D 5000 units daily  Take the azithromycin 250mg : Take two once then one daily until gone  Use your albuterol inhaler and nebulizer as needed  Take in plenty of fluids  I will follow up your covid results,   You may be a candidate for the monoclonal antibody if you are positive  A follow up video visit will be scheduled with Dr next Monday        Person Under Monitoring Name: Moore  Location: [address] [name] [address]   Infection Prevention Recommendations for Individuals Confirmed to have, or Being Evaluated for, 2019 Novel Coronavirus (COVID-19) Infection Who Receive Care at Home  Individuals who are confirmed to have, or are being evaluated for, COVID-19 should follow the prevention steps below until a healthcare provider or local or state health department says they can return to normal activities.  Stay home except to get medical care You should restrict activities outside your home, except for getting medical care. Do not go to work, school, or public areas, and do not use public transportation or taxis.  Call ahead before visiting your doctor Before your medical appointment, call the healthcare provider and tell them that you have, or are being evaluated for, COVID-19 infection. This will help the healthcare providers office take steps to keep other people from getting infected. Ask your healthcare provider to call the local or state health department.  Monitor your symptoms Seek prompt medical attention if your illness is worsening (e.g., difficulty breathing). Before going to your medical appointment, call the healthcare provider and tell them that you have, or are being evaluated for, COVID-19 infection. Ask your healthcare provider to call the local or state health department.  Wear a facemask You should wear a facemask that covers your nose and mouth when you are in  the same room with other people and when you visit a healthcare provider. People who live with or visit you should also wear a facemask while they are in the same room with you.  Separate yourself from other people in your home As much as possible, you should stay in a different room from other people in your home. Also, you should use a separate bathroom, if available.  Avoid sharing household items You should not share dishes, drinking glasses, cups, eating utensils, towels, bedding, or other items with other people in your home. After using these items, you should wash them thoroughly with soap and water.  Cover your coughs and sneezes Cover your mouth and nose with a tissue when you cough or sneeze, or you can cough or sneeze into your sleeve. Throw used tissues in a lined trash can, and immediately wash your hands with soap and water for at least 20 seconds or use an alcohol-based hand rub.  Wash your [place] your hands often and thoroughly with soap and water for at least 20 seconds. You can use an alcohol-based hand sanitizer if soap and water are not available and if your hands are not visibly dirty. Avoid touching your eyes, nose, and mouth with unwashed hands.   Prevention Steps for Caregivers and Household Members of Individuals Confirmed to have, or Being Evaluated for, COVID-19 Infection Being Cared for in the Home  If you live with, or provide care at home for, a person confirmed to have, or being evaluated for, COVID-19 infection please follow these guidelines to prevent infection:  Follow healthcare providers instructions Make  sure that you understand and can help the patient follow any healthcare provider instructions for all care.  Provide for the patients basic needs You should help the patient with basic needs in the home and provide support for getting groceries, prescriptions, and other personal needs.  Monitor the patients symptoms If they are getting  sicker, call his or her medical provider and tell them that the patient has, or is being evaluated for, COVID-19 infection. This will help the healthcare providers office take steps to keep other people from getting infected. Ask the healthcare provider to call the local or state health department.  Limit the number of people who have contact with the patient  If possible, have only one caregiver for the patient.  Other household members should stay in another home or place of residence. If this is not possible, they should stay  in another room, or be separated from the patient as much as possible. Use a separate bathroom, if available.  Restrict visitors who do not have an essential need to be in the home.  Keep older adults, very young children, and other sick people away from the patient Keep older adults, very young children, and those who have compromised immune systems or chronic health conditions away from the patient. This includes people with chronic heart, lung, or kidney conditions, diabetes, and cancer.  Ensure good ventilation Make sure that shared spaces in the home have good air flow, such as from an air conditioner or an opened window, weather permitting.  Wash your hands often  Wash your hands often and thoroughly with soap and water for at least 20 seconds. You can use an alcohol based hand sanitizer if soap and water are not available and if your hands are not visibly dirty.  Avoid touching your eyes, nose, and mouth with unwashed hands.  Use disposable paper towels to dry your hands. If not available, use dedicated cloth towels and replace them when they become wet.  Wear a facemask and gloves  Wear a disposable facemask at all times in the room and gloves when you touch or have contact with the patients blood, body fluids, and/or secretions or excretions, such as sweat, saliva, sputum, nasal mucus, vomit, urine, or feces.  Ensure the mask fits over your nose and  mouth tightly, and do not touch it during use.  Throw out disposable facemasks and gloves after using them. Do not reuse.  Wash your hands immediately after removing your facemask and gloves.  If your personal clothing becomes contaminated, carefully remove clothing and launder. Wash your hands after handling contaminated clothing.  Place all used disposable facemasks, gloves, and other waste in a lined container before disposing them with other household waste.  Remove gloves and wash your hands immediately after handling these items.  Do not share dishes, glasses, or other household items with the patient  Avoid sharing household items. You should not share dishes, drinking glasses, cups, eating utensils, towels, bedding, or other items with a patient who is confirmed to have, or being evaluated for, COVID-19 infection.  After the person uses these items, you should wash them thoroughly with soap and water.  Wash laundry thoroughly  Immediately remove and wash clothes or bedding that have blood, body fluids, and/or secretions or excretions, such as sweat, saliva, sputum, nasal mucus, vomit, urine, or feces, on them.  Wear gloves when handling laundry from the patient.  Read and follow directions on labels of laundry or clothing items and detergent. In  general, wash and dry with the warmest temperatures recommended on the label.  Clean all areas the individual has used often  Clean all touchable surfaces, such as counters, tabletops, doorknobs, bathroom fixtures, toilets, phones, keyboards, tablets, and bedside tables, every day. Also, clean any surfaces that may have blood, body fluids, and/or secretions or excretions on them.  Wear gloves when cleaning surfaces the patient has come in contact with.  Use a diluted bleach solution (e.g., dilute bleach with 1 part bleach and 10 parts water) or a household disinfectant with a label that says EPA-registered for coronaviruses. To make a  bleach solution at home, add 1 tablespoon of bleach to 1 quart (4 cups) of water. For a larger supply, add  cup of bleach to 1 gallon (16 cups) of water.  Read labels of cleaning products and follow recommendations provided on product labels. Labels contain instructions for safe and effective use of the cleaning product including precautions you should take when applying the product, such as wearing gloves or eye protection and making sure you have good ventilation during use of the product.  Remove gloves and wash hands immediately after cleaning.  Monitor yourself for signs and symptoms of illness Caregivers and household members are considered close contacts, should monitor their health, and will be asked to limit movement outside of the home to the extent possible. Follow the monitoring steps for close contacts listed on the symptom monitoring form.   ? If you have additional questions, contact your local health department or call the epidemiologist on call at [phone] (available 24/7). ? This guidance is subject to change. For the most up-to-date guidance from [facility], please refer to their website: [email or web address]

## 2019-12-09 NOTE — Progress Notes (Addendum)
Subjective:    Patient ID: Eduardo Moore, male    DOB: 16-Dec-1980, 39 y.o.   MRN: [id number]  11/03/2019 this visit was accomplished with Spanish interpreter [name] This is a 39 year old [place] who presents to the clinic for primary care to establish.  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.  The patient's history dates back to March of this year when after being exposed to ceiling tile that he thought had dust and other collected material that he inhaled precipitated increased breathing difficulties.  Subsequent to that he saw Dr. Chase Caller of pulmonary end of May and found to have a mass in the left lower lobe.  The patient underwent a variety of medical work-ups without a resolved diagnosis.  The patient also has a history of asthma-like symptoms.  He has continued had cough shortness of breath and episodic hemoptysis.  There is been other pulmonary work-ups done including a PET scan in June showing superior segment left lower lobe rounded nodule.  Other work-ups have shown eosinophil count of 300 and normal pulmonary function testing.  The patient subsequently went to [name] for a another opinion September 16, 2019.  He subsequent underwent biopsy and navigational bronchoscopy which was performed with [name] July 22.  Results of this did show aspergilloma on surgical biopsy from the biopsy of the bronc and as well from the micro biology studies.  Since that time the patient is pondering whether he should have a left lower lobe resection or not he was seeking other opinions.  Interestingly when I interviewed this patient with the use of a Spanish interpreter it was apparent no one had ever actually explained to him that his specific condition and also had not really shown him his imaging studies before  The patient was to see infectious disease however that appointment has  not been achieved as of yet.  He does have a return visit this week with [name].  The patient was given a Breo inhaler but he cannot afford it so he is not been taking it.  He does have a as needed albuterol inhaler.  He is still having episodes of hemoptysis.  Patient is also had nausea vomiting and low back pain along with left upper quadrant abdominal pain.  He has been in the emergency room twice for this with negative evaluations.  Again the patient is coming in today to establish for primary care.  Note this patient also is in need of a hepatitis C and HIV assay  12/09/2019  Spanish interpeter [name] from [place] used  Here for acute work in   I saw the patient in our isolation room with full PPE applied given that the possibility this could be a Covid infection.  Note the patient had mycetoma removed previously on September 3. The patient states he has had symptoms for the past 4 days and his family also have been ill.  His children and family members of all been Covid tested and are negative.  He has gone to a [facility] testing site as of yesterday 12/08/2019.  Unfortunately it is taking 2 to 3 days for results to return and his results are still pending for Covid  The patient states he has had chest pressure increased cough productive of beige-yellow mucus weakness fatigue but no fever.  He has had nausea GI complaints with diarrhea and increased dyspnea.  He  denies hemoptysis.  Note on 3 September he underwent a left lower lobe robotic procedure to remove a mycetoma from the left lower lobe and also  resection of necrotic lung.  The patient denies any loss of taste or smell.  Note a chest x-ray had been obtained earlier today and showed no active process and resolution of left apical pneumothorax volume loss expected from left lower lobe resection.  See operative note lower down in this note.  Note the patient has received the [place] vaccine for Covid in April and May of this  year.  Past Medical History:  Diagnosis Date  . Anxiety   . Depression   . Dyspnea    when walking  . GERD (gastroesophageal reflux disease)      Family History  Problem Relation Age of Onset  . Asthma Mother      Social History   Socioeconomic History  . Marital status: Single    Spouse name: Not on file  . Number of children: Not on file  . Years of education: Not on file  . Highest education level: Not on file  Occupational History  . Not on file  Tobacco Use  . Smoking status: Never Smoker  . Smokeless tobacco: Never Used  Vaping Use  . Vaping Use: Never used  Substance and Sexual Activity  . Alcohol use: Yes    Comment: occasional  . Drug use: Never  . Sexual activity: Not on file  Other Topics Concern  . Not on file  Social History Narrative  . Not on file   Social Determinants of Health   Financial Resource Strain:   . Difficulty of Paying Living Expenses: Not on file  Food Insecurity:   . Worried About Charity fundraiser in the Last Year: Not on file  . Ran Out of Food in the Last Year: Not on file  Transportation Needs:   . Lack of Transportation (Medical): Not on file  . Lack of Transportation (Non-Medical): Not on file  Physical Activity:   . Days of Exercise per Week: Not on file  . Minutes of Exercise per Session: Not on file  Stress:   . Feeling of Stress : Not on file  Social Connections:   . Frequency of Communication with Friends and Family: Not on file  . Frequency of Social Gatherings with Friends and Family: Not on file  . Attends Religious Services: Not on file  . Active Member of Clubs or Organizations: Not on file  . Attends Archivist Meetings: Not on file  . Marital Status: Not on file  Intimate Partner Violence:   . Fear of Current or Ex-Partner: Not on file  . Emotionally Abused: Not on file  . Physically Abused: Not on file  . Sexually Abused: Not on file     Allergies  Allergen Reactions  . Lentil  Shortness Of Breath and Rash    lentils  . Pea Shortness Of Breath and Rash    Green Peas     Outpatient Medications Prior to Visit  Medication Sig Dispense Refill  . acetaminophen (TYLENOL) 500 MG tablet Take 500 mg by mouth every 6 (six) hours as needed for mild pain.    Marland Kitchen albuterol (PROVENTIL) (2.5 MG/3ML) 0.083% nebulizer solution Take 2.5 mg by nebulization 2 (two) times daily as needed for wheezing or shortness of breath.    Marland Kitchen albuterol (VENTOLIN HFA) 108 (90 Base) MCG/ACT inhaler Inhale 2 puffs into the lungs every 6 (six)  hours as needed for wheezing or shortness of breath.    . ALPRAZolam (XANAX) 0.25 MG tablet Take 1 tablet (0.25 mg total) by mouth 2 (two) times daily as needed for anxiety. 30 tablet 0  . azithromycin (ZITHROMAX Z-PAK) 250 MG tablet Take 2 tablets (500 mg total) by mouth daily for 1 day, THEN 1 tablet (250 mg total) daily for 4 days. 6 tablet 0  . fluticasone furoate-vilanterol (BREO ELLIPTA) 200-25 MCG/INH AEPB Inhale 1 puff into the lungs daily. 60 each 11  . gabapentin (NEURONTIN) 300 MG capsule Take 1 capsule (300 mg total) by mouth once a week for 1 day, THEN 1 capsule (300 mg total) 2 (two) times daily for 1 day, THEN 1 capsule (300 mg total) 3 (three) times daily. 90 capsule 1  . omeprazole (PRILOSEC) 40 MG capsule Take 1 capsule (40 mg total) by mouth 2 (two) times daily. To reduce stomach acid 60 capsule 2  . ondansetron (ZOFRAN) 4 MG tablet Take 1 tablet (4 mg total) by mouth every 8 (eight) hours as needed for nausea or vomiting. 30 tablet 5  . polyethylene glycol (MIRALAX) 17 g packet Take 17 g by mouth daily. 14 each 0   No facility-administered medications prior to visit.      Review of Systems  Constitutional: Negative.   HENT: Negative.   Eyes: Negative.   Respiratory: Positive for cough, chest tightness, shortness of breath and wheezing.        Hemoptysis  Cardiovascular: Positive for chest pain.  Gastrointestinal: Positive for abdominal  pain, diarrhea and nausea. Negative for anal bleeding, blood in stool, constipation, rectal pain and vomiting.  Endocrine: Negative.   Genitourinary: Positive for flank pain.  Musculoskeletal: Positive for back pain.  Skin: Negative.   Neurological: Negative.   Hematological: Negative.   Psychiatric/Behavioral: Negative.        Objective:   Physical Exam Blood pressure 138/79 temp 98 saturation 94% room air pulse 92 weight 193.8 pounds  Gen: Pleasant, well-nourished, in no distress,  normal affect  ENT: No lesions,  mouth clear,  oropharynx clear, no postnasal drip  Neck: No JVD, no TMG, no carotid bruits  Lungs: No use of accessory muscles, no dullness to percussion, decreased breath sounds left lower lobe with scattered rhonchi few expired wheezes  Cardiovascular: RRR, heart sounds normal, no murmur or gallops, no peripheral edema  Abdomen: soft and NT, no HSM,  BS normal  Musculoskeletal: No deformities, no cyanosis or clubbing  Neuro: alert, non focal  Skin: Warm, no lesions or rashes All labs reviewed  DESCRIPTION OF PROCEDURE:  The patient was brought to the operating room on 10/02/2019.  He had induction of general anesthesia and was intubated.  A timeout was performed.  Flexible fiberoptic bronchoscopy was performed via the endotracheal tube.  It  revealed normal endobronchial anatomy with no endobronchial lesions to the level of the subsegmental bronchi.  There were some thick, clear secretions in the superior segmental bronchus.  Planning for the procedure had been done on the computer preoperatively.  The locatable guide for navigation was placed and registration was performed.  The bronchoscope then was directed to the left lower lobe bronchus and the locatable guide was advanced into the superior segmental bronchus, into the  appropriate subsegmental bronchus and then to within a centimeter of the center of the lesion with good alignment.  Position was confirmed with  fluoroscopy.  Sampling then was performed.  All sampling was performed with fluoroscopy.  Needle  aspirations  were performed.  The first 2 were placed on the slides.  The third aspiration was placed only in the cell block.  Next, 3 needle brushings were performed and then multiple biopsies were obtained.  Biopsies were sent for both AFB and fungal cultures, as  well as for permanent pathology.  The quick preps on the needle aspirations and brushings showed acute inflammation, no tumor cells were seen, nor were there any definite granulomas seen.  There was some mild bleeding with the biopsies.  After obtaining  sufficient biopsies for both pathology and cultures,  bronchoalveolar lavage was performed. 100 mL of saline was instilled and 10 mL was obtained with aspiration.  This was sent for AFB and fungal cultures as well.  A final inspection was made with the  bronchoscope and there was no ongoing bleeding.  The total fluoroscopy time was 4.5 minutes with a total dose of 42 milligray.  The patient then was extubated in the operating room and taken to the [facility] in good condition.   LEFT LOWER LOBE SPECIMEN C  Performed at [facility], [place] [address], [place], [place] [address]   Culture RARE FUNGUS (MOLD) ISOLATED, PROBABLE CONTAMINANT/COLONIZER (SAPROPHYTE). CONTACT MICROBIOLOGY IF FURTHER IDENTIFICATION REQUIRED [phone].    7/22 FINAL MICROSCOPIC DIAGNOSIS:   A. LUNG, LEFT LOWER LOBE, BIOPSY:  - Aspergilloma, see comment   B. LUNG, LEFT LOWER LOBE, BIOPSY:  - Aspergilloma, see comment    COMMENT:   A and B. GMS stain is confirmatory    DATE OF PROCEDURE:  11/14/2019  PREOPERATIVE DIAGNOSIS:  Aspergilloma left lower lobe with hemoptysis.  POSTOPERATIVE DIAGNOSIS:  Aspergilloma left lower lobe with hemoptysis.  PROCEDURE:   Xi robotic video-assisted left thoracoscopy Lysis of adhesions, Left lower lobectomy, Lymph node dissection, and Intercostal  nerve blocks levels 3 through 10.  SURGEON:  Modesto Charon, MD  ASSISTANT:  Nicholes Rough, PA-C  ANESTHESIA:  General.  FINDINGS:  Severe adhesions throughout the pleural space.  Mass clearly visible in superior segment.       Assessment & Plan:  I personally reviewed all images and lab data in the [facility] system as well as any outside material available during this office visit and agree with the  radiology impressions.   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  Pneumothorax Pneumothorax left lung resolved on current chest x-ray status post thorascopic surgery with aspergilloma removal  COVID-19 virus test result unknown Covid virus result of test unknown  Current delays existing due to high volume of testing  We will keep patient in isolation till test result returns  If the patient does have Covid he would be a candidate for monoclonal antibody infusion we discussed this and the patient is interested should he be Covid positive   Diagnoses and all orders for this visit:  COVID-19 virus test result unknown  Status post robot-assisted surgical procedure  History of aspergilloma  Acute tracheobronchitis  Pneumothorax, unspecified type  Other orders -     benzonatate (TESSALON) 200 MG capsule; Take 1 capsule (200 mg total) by mouth 2 (two) times daily as needed for cough. -     Cholecalciferol (VITAMIN D) 125 MCG (5000 UT) CAPS; One  daily -     zinc gluconate 50 MG tablet; Take 1 tablet (50 mg total) by mouth daily. -     Ascorbic Acid (VITAMIN C) 500 MG CAPS; One daily

## 2019-12-09 NOTE — Assessment & Plan Note (Signed)
Pneumothorax left lung resolved on current chest x-ray status post thorascopic surgery with aspergilloma removal

## 2019-12-09 NOTE — Progress Notes (Signed)
[address]       [name] [address]             [phone]       HPI: [name] returns today complaining of chills and a sore throat and congestion.  He is accompanied by a [place] language interpreter.  Eduardo Moore is a 39 year old man who presented initially with wheezing and hemoptysis.  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  He had multiple other cavitary lesions in the lower lobe as well.  I did a robotic lower lobectomy on 11/14/2019.  He went home on day 4.  He had a lot of pain initially.  That improved significantly once we start him on gabapentin.  He complains of 2 days of chills, sore throat, cough, and congestion.  He says that his daughter had similar symptoms and a Covid test was negative.  He says that he has had a nonproductive cough and sore throat.  Mild dizziness.  Has felt congested and had some wheezing.  Has been using Mucinex.  Is also been using his inhalers.  Increased incisional pain with coughing.  Past Medical History:  Diagnosis Date  . Anxiety   . Depression   . Dyspnea    when walking  . GERD (gastroesophageal reflux disease)     Current Outpatient Medications  Medication Sig Dispense Refill  . acetaminophen (TYLENOL) 500 MG tablet Take 500 mg by mouth every 6 (six) hours as needed for mild pain.    Marland Kitchen albuterol (PROVENTIL) (2.5 MG/3ML) 0.083% nebulizer solution Take 2.5 mg by nebulization 2 (two) times daily as needed for wheezing or shortness of breath.    Marland Kitchen albuterol (VENTOLIN HFA) 108 (90 Base) MCG/ACT inhaler Inhale 2 puffs into the lungs every 6 (six) hours as needed for wheezing or shortness of breath.    . ALPRAZolam (XANAX) 0.25 MG tablet Take 1 tablet (0.25 mg total) by mouth 2 (two) times daily as needed for anxiety. 30 tablet 0  . fluticasone furoate-vilanterol (BREO ELLIPTA) 200-25 MCG/INH AEPB Inhale 1 puff into the lungs daily. 60 each 11  . gabapentin (NEURONTIN) 300 MG  capsule Take 1 capsule (300 mg total) by mouth once a week for 1 day, THEN 1 capsule (300 mg total) 2 (two) times daily for 1 day, THEN 1 capsule (300 mg total) 3 (three) times daily. 90 capsule 1  . omeprazole (PRILOSEC) 40 MG capsule Take 1 capsule (40 mg total) by mouth 2 (two) times daily. To reduce stomach acid 60 capsule 2  . ondansetron (ZOFRAN) 4 MG tablet Take 1 tablet (4 mg total) by mouth every 8 (eight) hours as needed for nausea or vomiting. 30 tablet 5  . polyethylene glycol (MIRALAX) 17 g packet Take 17 g by mouth daily. 14 each 0  . azithromycin (ZITHROMAX Z-PAK) 250 MG tablet Take 2 tablets (500 mg total) by mouth daily for 1 day, THEN 1 tablet (250 mg total) daily for 4 days. 6 tablet 0   No current facility-administered medications for this visit.    Physical Exam BP (!) 149/79   Pulse 82   Temp 97.9 F (36.6 C) (Skin)   Resp 20   Ht 5\' 8"  (1.727 m)   Wt 198 lb (89.8 kg)   PF 96 L/min   BMI 30.93 kg/m  39 year old man in no acute distress Alert and oriented Lungs with rhonchi bilaterally Cardiac regular rate and rhythm  normal S1 and S2 Incisions clean dry and intact  Diagnostic Tests: CHEST - 2 VIEW  COMPARISON:  CT 11/20/2019.  Chest x-ray 11/20/2019.  FINDINGS: Mediastinum hilar structures normal. Postsurgical changes left lung with persistent small left pleural effusion or pleural thickening. Previously identified left pneumothorax no longer visualized.  IMPRESSION: Postsurgical changes left lung with persistent small left pleural effusion or pleural thickening. Previously identified left pneumothorax no longer visualized.   Electronically Signed   By: Maisie Fus  Register   On: 12/09/2019 10:17 I personally reviewed the chest x-ray images.  Normal postoperative appearance.  Impression: Eduardo Moore is a 39 year old man who is a little over 3 weeks out from a left lower lobectomy for an aspergilloma with hemoptysis.  He now presents  with respiratory illness with cough, sore throat, and chills.  He says that his daughter had similar symptoms and tested negative for Covid.  He had a Covid test yesterday but the result is not back yet.  He has some significant rhonchi on exam.  There is no evidence of a pneumonia on his chest x-ray.  I suspect he has bronchitis.  I will give him a prescription for Zithromax for that.  He was instructed on how to take that through the interpreter.  Continue cough suppressant, Mucinex, and inhalers as needed  Plan: Given prescription for Zithromax Continue Mucinex, cough suppressant, inhalers as needed Weight resolved with a COVID-19 test Return in 3 weeks  Loreli Slot, MD [facility] [phone]

## 2019-12-09 NOTE — Telephone Encounter (Signed)
As instructed by Md Delford Field, schedule pt last appt of the day  the clinic.

## 2019-12-09 NOTE — Assessment & Plan Note (Signed)
Covid virus result of test unknown  Current delays existing due to high volume of testing  We will keep patient in isolation till test result returns  If the patient does have Covid he would be a candidate for monoclonal antibody infusion we discussed this and the patient is interested should he be Covid positive

## 2019-12-10 ENCOUNTER — Telehealth: Payer: Self-pay | Admitting: Critical Care Medicine

## 2019-12-10 DIAGNOSIS — Z20822 Contact with and (suspected) exposure to covid-19: Secondary | ICD-10-CM

## 2019-12-10 NOTE — Assessment & Plan Note (Signed)
COVID NEG 12/10/2019

## 2019-12-10 NOTE — Telephone Encounter (Signed)
I connected with this patient by phone and told him his Covid test was negative.  He likely has a tracheobronchitis.  She takes the antibiotics prescribed by Dr. [name] stay on his inhalers continue to take the vitamin supplementation for 1 month and we will see him back in follow-up

## 2019-12-12 LAB — FUNGUS CULTURE RESULT

## 2019-12-12 LAB — FUNGUS CULTURE WITH STAIN

## 2019-12-12 LAB — FUNGAL ORGANISM REFLEX

## 2019-12-15 ENCOUNTER — Other Ambulatory Visit: Payer: Self-pay | Admitting: Critical Care Medicine

## 2019-12-15 ENCOUNTER — Telehealth: Payer: Self-pay | Admitting: Critical Care Medicine

## 2019-12-15 DIAGNOSIS — Z9889 Other specified postprocedural states: Secondary | ICD-10-CM

## 2019-12-15 DIAGNOSIS — F418 Other specified anxiety disorders: Secondary | ICD-10-CM

## 2019-12-15 DIAGNOSIS — J209 Acute bronchitis, unspecified: Secondary | ICD-10-CM

## 2019-12-15 DIAGNOSIS — Z8619 Personal history of other infectious and parasitic diseases: Secondary | ICD-10-CM

## 2019-12-15 DIAGNOSIS — R4589 Other symptoms and signs involving emotional state: Secondary | ICD-10-CM

## 2019-12-15 MED ORDER — CIPROFLOXACIN HCL 500 MG PO TABS: 500.0000 mg | ORAL_TABLET | Freq: Two times a day (BID) | ORAL | 0 refills | Status: DC

## 2019-12-15 MED ORDER — PREDNISONE 10 MG PO TABS: ORAL_TABLET | ORAL | 0 refills | Status: DC

## 2019-12-15 MED FILL — predniSONE 10 MG TABS: 10 | 5 days supply | Qty: 20 | Fill #0

## 2019-12-15 MED FILL — CIPROFLOXACIN HCL 500 MG TA: 500 | 7 days supply | Qty: 14 | Fill #0

## 2019-12-15 NOTE — Progress Notes (Signed)
Subjective:    Patient ID: Eduardo Moore, male    DOB: 03/11/1981, 39 y.o.   MRN: [id number] Virtual Visit via Telephone Note  I connected with Eduardo Moore on 12/15/19 at  2:40 PM EDT by telephone and verified that I am speaking with the correct person using two identifiers.   Consent:  I discussed the limitations, risks, security and privacy concerns of performing an evaluation and management service by telephone and the availability of in person appointments. I also discussed with the patient that there may be a patient responsible charge related to this service. The patient expressed understanding and agreed to proceed.  Location of patient: Patient was at home  Location of provider: I was in my office  Persons participating in the televisit with the patient.   Pose a interpreter provided Spanish interpretation interpreter [phone]    History of Present Illness:  11/03/2019 this visit was accomplished with Spanish interpreter [name] This is a 39 year old Eduardo Moore who presents to the clinic for primary care to establish.  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.  The patient's history dates back to March of this year when after being exposed to ceiling tile that he thought had dust and other collected material that he inhaled precipitated increased breathing difficulties.  Subsequent to that he saw Dr. Chase Caller of pulmonary end of May and found to have a mass in the left lower lobe.  The patient underwent a variety of medical work-ups without a resolved diagnosis.  The patient also has a history of asthma-like symptoms.  He has continued had cough shortness of breath and episodic hemoptysis.  There is been other pulmonary work-ups done including a PET scan in June showing superior segment left lower lobe rounded nodule.  Other work-ups have shown eosinophil count of  300 and normal pulmonary function testing.  The patient subsequently went to [name] for a another opinion September 16, 2019.  He subsequent underwent biopsy and navigational bronchoscopy which was performed with [name] July 22.  Results of this did show aspergilloma on surgical biopsy from the biopsy of the bronc and as well from the micro biology studies.  Since that time the patient is pondering whether he should have a left lower lobe resection or not he was seeking other opinions.  Interestingly when I interviewed this patient with the use of a Spanish interpreter it was apparent no one had ever actually explained to him that his specific condition and also had not really shown him his imaging studies before  The patient was to see infectious disease however that appointment has not been achieved as of yet.  He does have a return visit this week with [name].  The patient was given a Breo inhaler but he cannot afford it so he is not been taking it.  He does have a as needed albuterol inhaler.  He is still having episodes of hemoptysis.  Patient is also had nausea vomiting and low back pain along with left upper quadrant abdominal pain.  He has been in the emergency room twice for this with negative evaluations.  Again the patient is coming in today to establish for primary care.  Note this patient also is in need of a hepatitis C and HIV assay  12/09/2019  Spanish interpeter [name] from [place] used  Here for acute work in   I saw  the patient in our isolation room with full PPE applied given that the possibility this could be a Covid infection.  Note the patient had mycetoma removed previously on September 3. The patient states he has had symptoms for the past 4 days and his family also have been ill.  His children and family members of all been Covid tested and are negative.  He has gone to a [place] testing site as of yesterday 12/08/2019.  Unfortunately it is taking 2  to 3 days for results to return and his results are still pending for Covid  The patient states he has had chest pressure increased cough productive of beige-yellow mucus weakness fatigue but no fever.  He has had nausea GI complaints with diarrhea and increased dyspnea.  He denies hemoptysis.  Note on 3 September he underwent a left lower lobe robotic procedure to remove a mycetoma from the left lower lobe and also  resection of necrotic lung.  The patient denies any loss of taste or smell.  Note a chest x-ray had been obtained earlier today and showed no active process and resolution of left apical pneumothorax volume loss expected from left lower lobe resection.  See operative note lower down in this note.  Note the patient has received the [place] vaccine for Covid in April and May of this year.  12/15/2019 [facility] follow up visit: This is a telephone visit This patient is seen in short-term follow-up and states his cough is slightly better but is still bringing up considerable amounts of beige mucus notes increased wheezing.  He states his fever is gone.  He still short of breath with exertion.  He is using all his inhalers.  His Covid test was negative.  He still taking his vitamin supplements.  Still using benzonatate if needed for cough   Past Medical History:  Diagnosis Date  . Anxiety   . Depression   . Dyspnea    when walking  . GERD (gastroesophageal reflux disease)      Family History  Problem Relation Age of Onset  . Asthma Mother      Social History   Socioeconomic History  . Marital status: Single    Spouse name: Not on file  . Number of children: Not on file  . Years of education: Not on file  . Highest education level: Not on file  Occupational History  . Not on file  Tobacco Use  . Smoking status: Never Smoker  . Smokeless tobacco: Never Used  Vaping Use  . Vaping Use: Never used  Substance and Sexual Activity  . Alcohol use: Yes    Comment: occasional   . Drug use: Never  . Sexual activity: Not on file  Other Topics Concern  . Not on file  Social History Narrative  . Not on file   Social Determinants of Health   Financial Resource Strain:   . Difficulty of Paying Living Expenses: Not on file  Food Insecurity:   . Worried About Charity fundraiser in the Last Year: Not on file  . Ran Out of Food in the Last Year: Not on file  Transportation Needs:   . Lack of Transportation (Medical): Not on file  . Lack of Transportation (Non-Medical): Not on file  Physical Activity:   . Days of Exercise per Week: Not on file  . Minutes of Exercise per Session: Not on file  Stress:   . Feeling of Stress : Not on file  Social  Connections:   . Frequency of Communication with Friends and Family: Not on file  . Frequency of Social Gatherings with Friends and Family: Not on file  . Attends Religious Services: Not on file  . Active Member of Clubs or Organizations: Not on file  . Attends Archivist Meetings: Not on file  . Marital Status: Not on file  Intimate Partner Violence:   . Fear of Current or Ex-Partner: Not on file  . Emotionally Abused: Not on file  . Physically Abused: Not on file  . Sexually Abused: Not on file     Allergies  Allergen Reactions  . Lentil Shortness Of Breath and Rash    lentils  . Pea Shortness Of Breath and Rash    Green Peas     Outpatient Medications Prior to Visit  Medication Sig Dispense Refill  . acetaminophen (TYLENOL) 500 MG tablet Take 500 mg by mouth every 6 (six) hours as needed for mild pain.    Marland Kitchen albuterol (PROVENTIL) (2.5 MG/3ML) 0.083% nebulizer solution Take 2.5 mg by nebulization 2 (two) times daily as needed for wheezing or shortness of breath.    Marland Kitchen albuterol (VENTOLIN HFA) 108 (90 Base) MCG/ACT inhaler Inhale 2 puffs into the lungs every 6 (six) hours as needed for wheezing or shortness of breath.    . ALPRAZolam (XANAX) 0.25 MG tablet Take 1 tablet (0.25 mg total) by mouth 2 (two)  times daily as needed for anxiety. 30 tablet 0  . Ascorbic Acid (VITAMIN C) 500 MG CAPS One daily 30 capsule 0  . benzonatate (TESSALON) 200 MG capsule Take 1 capsule (200 mg total) by mouth 2 (two) times daily as needed for cough. 20 capsule 0  . Cholecalciferol (VITAMIN D) 125 MCG (5000 UT) CAPS One daily 30 capsule 0  . fluticasone furoate-vilanterol (BREO ELLIPTA) 200-25 MCG/INH AEPB Inhale 1 puff into the lungs daily. 60 each 11  . gabapentin (NEURONTIN) 300 MG capsule Take 1 capsule (300 mg total) by mouth once a week for 1 day, THEN 1 capsule (300 mg total) 2 (two) times daily for 1 day, THEN 1 capsule (300 mg total) 3 (three) times daily. 90 capsule 1  . omeprazole (PRILOSEC) 40 MG capsule Take 1 capsule (40 mg total) by mouth 2 (two) times daily. To reduce stomach acid 60 capsule 2  . ondansetron (ZOFRAN) 4 MG tablet Take 1 tablet (4 mg total) by mouth every 8 (eight) hours as needed for nausea or vomiting. 30 tablet 5  . polyethylene glycol (MIRALAX) 17 g packet Take 17 g by mouth daily. 14 each 0  . zinc gluconate 50 MG tablet Take 1 tablet (50 mg total) by mouth daily. 30 tablet 0   No facility-administered medications prior to visit.      Review of Systems  Constitutional: Positive for fatigue.  HENT: Negative.  Negative for nosebleeds, postnasal drip, rhinorrhea, sinus pressure, sinus pain, sneezing, sore throat, tinnitus and trouble swallowing.   Eyes: Negative.   Respiratory: Positive for cough, chest tightness, shortness of breath and wheezing.        Hemoptysis  Cardiovascular: Negative for chest pain.  Gastrointestinal: Positive for nausea. Negative for abdominal pain, anal bleeding, blood in stool, constipation, diarrhea, rectal pain and vomiting.  Endocrine: Negative.   Genitourinary: Negative for flank pain.  Musculoskeletal: Negative for back pain.  Skin: Negative.   Neurological: Negative.   Hematological: Negative.   Psychiatric/Behavioral: Negative.         Objective:  Physical Exam  No exam.  This is a phone note All labs reviewed  DESCRIPTION OF PROCEDURE:  The patient was brought to the operating room on 10/02/2019.  He had induction of general anesthesia and was intubated.  A timeout was performed.  Flexible fiberoptic bronchoscopy was performed via the endotracheal tube.  It  revealed normal endobronchial anatomy with no endobronchial lesions to the level of the subsegmental bronchi.  There were some thick, clear secretions in the superior segmental bronchus.  Planning for the procedure had been done on the computer preoperatively.  The locatable guide for navigation was placed and registration was performed.  The bronchoscope then was directed to the left lower lobe bronchus and the locatable guide was advanced into the superior segmental bronchus, into the  appropriate subsegmental bronchus and then to within a centimeter of the center of the lesion with good alignment.  Position was confirmed with fluoroscopy.  Sampling then was performed.  All sampling was performed with fluoroscopy.  Needle aspirations  were performed.  The first 2 were placed on the slides.  The third aspiration was placed only in the cell block.  Next, 3 needle brushings were performed and then multiple biopsies were obtained.  Biopsies were sent for both AFB and fungal cultures, as  well as for permanent pathology.  The quick preps on the needle aspirations and brushings showed acute inflammation, no tumor cells were seen, nor were there any definite granulomas seen.  There was some mild bleeding with the biopsies.  After obtaining  sufficient biopsies for both pathology and cultures,  bronchoalveolar lavage was performed. 100 mL of saline was instilled and 10 mL was obtained with aspiration.  This was sent for AFB and fungal cultures as well.  A final inspection was made with the  bronchoscope and there was no ongoing bleeding.  The total fluoroscopy time was 4.5 minutes  with a total dose of 42 milligray.  The patient then was extubated in the operating room and taken to the [facility] in good condition.   LEFT LOWER LOBE SPECIMEN C  Performed at [facility], [place] [address] [place], [place] [address]   Culture RARE FUNGUS (MOLD) ISOLATED, PROBABLE CONTAMINANT/COLONIZER (SAPROPHYTE). CONTACT MICROBIOLOGY IF FURTHER IDENTIFICATION REQUIRED [id number].    7/22 FINAL MICROSCOPIC DIAGNOSIS:   A. LUNG, LEFT LOWER LOBE, BIOPSY:  - Aspergilloma, see comment   B. LUNG, LEFT LOWER LOBE, BIOPSY:  - Aspergilloma, see comment    COMMENT:   A and B. GMS stain is confirmatory    DATE OF PROCEDURE:  11/14/2019  PREOPERATIVE DIAGNOSIS:  Aspergilloma left lower lobe with hemoptysis.  POSTOPERATIVE DIAGNOSIS:  Aspergilloma left lower lobe with hemoptysis.  PROCEDURE:   Xi robotic video-assisted left thoracoscopy Lysis of adhesions, Left lower lobectomy, Lymph node dissection, and Intercostal nerve blocks levels 3 through 10.  SURGEON:  Modesto Charon, MD  ASSISTANT:  Nicholes Rough, PA-C  ANESTHESIA:  General.  FINDINGS:  Severe adhesions throughout the pleural space.  Mass clearly visible in superior segment.       Assessment & Plan:  I personally reviewed all images and lab data in the [facility] system as well as any outside material available during this office visit and agree with the  radiology impressions.   Acute tracheobronchitis Acute tracheobronchitis slow to resolve  Will begin Cipro 500 mg twice daily for 7 days and pulse prednisone  The patient will continue current inhaled medications   Rashad was seen today for  follow-up.  Diagnoses and all orders for this visit:  Acute tracheobronchitis  Status post robot-assisted surgical procedure  History of aspergilloma  Anxiety about health  Other orders -     ciprofloxacin (CIPRO) 500 MG tablet; Take 1 tablet (500 mg total) by mouth 2 (two) times  daily for 7 days. -     predniSONE (DELTASONE) 10 MG tablet; Take 4 tablets daily for 5 days then stop    Follow Up Instructions: Patient knows to come to the office in a month for follow-up   I discussed the assessment and treatment plan with the patient. The patient was provided an opportunity to ask questions and all were answered. The patient agreed with the plan and demonstrated an understanding of the instructions.   The patient was advised to call back or seek an in-person evaluation if the symptoms worsen or if the condition fails to improve as anticipated.  I provided 30 minutes of non-face-to-face time during this encounter  including  median intraservice time , review of notes, labs, imaging, medications  and explaining diagnosis and management to the patient .    Asencion Noble, MD

## 2019-12-15 NOTE — Progress Notes (Signed)
Patient verified DOB  

## 2019-12-15 NOTE — Assessment & Plan Note (Signed)
Acute tracheobronchitis slow to resolve  Will begin Cipro 500 mg twice daily for 7 days and pulse prednisone  The patient will continue current inhaled medications

## 2019-12-16 LAB — FUNGUS CULTURE WITH STAIN

## 2019-12-16 LAB — FUNGAL ORGANISM REFLEX

## 2019-12-16 LAB — FUNGUS CULTURE RESULT

## 2019-12-17 ENCOUNTER — Ambulatory Visit: Payer: Self-pay | Attending: Critical Care Medicine | Admitting: Licensed Clinical Social Worker

## 2019-12-17 ENCOUNTER — Other Ambulatory Visit: Payer: Self-pay

## 2019-12-17 DIAGNOSIS — F4323 Adjustment disorder with mixed anxiety and depressed mood: Secondary | ICD-10-CM

## 2019-12-22 ENCOUNTER — Other Ambulatory Visit: Payer: Self-pay | Admitting: Critical Care Medicine

## 2019-12-22 ENCOUNTER — Telehealth: Payer: Self-pay | Admitting: Internal Medicine

## 2019-12-22 ENCOUNTER — Telehealth: Payer: Self-pay

## 2019-12-22 MED ORDER — BREO ELLIPTA 200-25 MCG/INH IN AEPB: 1.0000 | INHALATION_SPRAY | Freq: Every day | RESPIRATORY_TRACT | 11 refills | Status: DC

## 2019-12-22 MED FILL — BREO ELLIPTA 200-25 MCG INH: 200-25 | 30 days supply | Qty: 60 | Fill #0

## 2019-12-22 NOTE — Telephone Encounter (Signed)
Patient called requesting a refill of his Breo inhaler.  Advised patient to contact his Pulmonologist for inhaler refills.  He acknowledged receipt.

## 2019-12-22 NOTE — Telephone Encounter (Signed)
Called let patient know medication was sent to his correct pharmacy.  Nothing further needed at this time.

## 2019-12-22 NOTE — Telephone Encounter (Signed)
Medication Refill - Medication: fluticasone furoate-vilanterol (BREO ELLIPTA) 200-25 MCG/INH AEPB    Preferred Pharmacy (with phone number or street name):  [facility] - [place], [place] - [place] [address] Phone:  [phone]  Fax:  [phone]       Agent: Please be advised that RX refills may take up to 3 business days. We ask that you follow-up with your pharmacy.

## 2019-12-30 ENCOUNTER — Other Ambulatory Visit: Payer: Self-pay

## 2019-12-30 ENCOUNTER — Encounter: Payer: Self-pay | Admitting: Thoracic Surgery (Cardiothoracic Vascular Surgery)

## 2019-12-30 ENCOUNTER — Ambulatory Visit (INDEPENDENT_AMBULATORY_CARE_PROVIDER_SITE_OTHER): Payer: Self-pay | Admitting: Thoracic Surgery (Cardiothoracic Vascular Surgery)

## 2019-12-30 VITALS — BP 135/75 | HR 88 | Temp 98.8°F | Resp 20 | Ht 68.0 in | Wt 195.0 lb

## 2019-12-30 DIAGNOSIS — B449 Aspergillosis, unspecified: Secondary | ICD-10-CM

## 2019-12-30 DIAGNOSIS — Z09 Encounter for follow-up examination after completed treatment for conditions other than malignant neoplasm: Secondary | ICD-10-CM

## 2019-12-30 NOTE — Progress Notes (Signed)
[address]       [name] [address]             [phone]     HPI: [name] returns for scheduled follow-up visit.  He is accompanied by professional interpreter.  Eduardo Moore is a Moore year old Hispanic male who presented with wheezing and hemoptysis.  He was found to have a left lower lobe lung mass.  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  There were multiple other cavitary lesions in the lower lobe.  We did a robotic lower lobectomy on 11/14/2019.  He went home on postoperative day #4.  Initially he was having a lot of pain.  That improved dramatically with gabapentin.  He still taking that but is down to 1 pill a day.  He has been treated for a couple episodes of bronchitis postoperatively.  He is feeling much better.  He still has some incisional pain.  He is not taking any narcotics for that.  He is able to do most activities without any difficulty but if he bends or turns a certain way it hurts.  He is not having any respiratory issues currently.  Past Medical History:  Diagnosis Date  . Anxiety   . Depression   . Dyspnea    when walking  . GERD (gastroesophageal reflux disease)     Current Outpatient Medications  Medication Sig Dispense Refill  . acetaminophen (TYLENOL) 500 MG tablet Take 500 mg by mouth every 6 (six) hours as needed for mild pain.    Marland Kitchen albuterol (PROVENTIL) (2.5 MG/3ML) 0.083% nebulizer solution Take 2.5 mg by nebulization 2 (two) times daily as needed for wheezing or shortness of breath.    Marland Kitchen albuterol (VENTOLIN HFA) 108 (90 Base) MCG/ACT inhaler Inhale 2 puffs into the lungs every 6 (six) hours as needed for wheezing or shortness of breath.    . ALPRAZolam (XANAX) 0.25 MG tablet Take 1 tablet (0.25 mg total) by mouth 2 (two) times daily as needed for anxiety. 30 tablet 0  . Ascorbic Acid (VITAMIN C) 500 MG CAPS One daily 30 capsule 0  . Cholecalciferol (VITAMIN D) 125 MCG (5000 UT) CAPS One daily 30 capsule 0    . fluticasone furoate-vilanterol (BREO ELLIPTA) 200-25 MCG/INH AEPB Inhale 1 puff into the lungs daily. 60 each 11  . gabapentin (NEURONTIN) 300 MG capsule Take 1 capsule (300 mg total) by mouth once a week for 1 day, THEN 1 capsule (300 mg total) 2 (two) times daily for 1 day, THEN 1 capsule (300 mg total) 3 (three) times daily. 90 capsule 1  . omeprazole (PRILOSEC) Moore MG capsule Take 1 capsule (Moore mg total) by mouth 2 (two) times daily. To reduce stomach acid 60 capsule 2  . ondansetron (ZOFRAN) 4 MG tablet Take 1 tablet (4 mg total) by mouth every 8 (eight) hours as needed for nausea or vomiting. 30 tablet 5  . polyethylene glycol (MIRALAX) 17 g packet Take 17 g by mouth daily. 14 each 0  . zinc gluconate 50 MG tablet Take 1 tablet (50 mg total) by mouth daily. 30 tablet 0  . benzonatate (TESSALON) 200 MG capsule Take 1 capsule (200 mg total) by mouth 2 (two) times daily as needed for cough. (Patient not taking: Reported on 12/30/2019) 20 capsule 0  . predniSONE (DELTASONE) 10 MG tablet Take 4 tablets daily for 5 days then stop (Patient not taking: Reported on 12/30/2019) 20 tablet 0  No current facility-administered medications for this visit.    Physical Exam BP 135/75   Pulse 88   Temp 98.8 F (37.1 C) (Skin)   Resp 20   Ht 5\' 8"  (1.727 m)   Wt 195 lb (88.5 kg)   SpO2 95% Comment: RA  BMI 29.8 kg/m  Moore year old man in no acute distress Alert and oriented x3 with no focal deficits Well-developed and well-nourished Lungs diminished at left base but otherwise clear, no wheezing Cardiac regular rate and rhythm Incisions well-healed  Impression: Eduardo Moore is a Moore year old man who is about 6 weeks out from a left lower lobectomy for aspergilloma.  He had a lot of pain initially but had dramatic improvement with gabapentin.  He is still on that medication but is only taking it once a day.  I recommended that he continue to take that for another 6 weeks.  I'll see him  back at that time and we can discuss stopping it.  He was treated with antibiotics couple times for bronchitis.  Those symptoms have now resolved.  Overall he is feeling a lot better and is anxious to resume normal activities.  Plan: Return in 6 weeks with PA lateral chest x-ray  39, MD [facility] [phone]

## 2020-01-02 ENCOUNTER — Telehealth: Payer: Self-pay | Admitting: Licensed Clinical Social Worker

## 2020-01-02 NOTE — Telephone Encounter (Signed)
Completed Legal Aid referral placed.  

## 2020-01-05 LAB — ACID FAST CULTURE WITH REFLEXED SENSITIVITIES (MYCOBACTERIA): Acid Fast Culture: NEGATIVE

## 2020-01-06 ENCOUNTER — Other Ambulatory Visit: Payer: Self-pay

## 2020-01-06 ENCOUNTER — Encounter: Payer: Self-pay | Admitting: Critical Care Medicine

## 2020-01-06 ENCOUNTER — Ambulatory Visit: Payer: Self-pay | Attending: Critical Care Medicine | Admitting: Critical Care Medicine

## 2020-01-06 VITALS — BP 146/76 | HR 87 | Temp 97.9°F | Ht 67.5 in | Wt 201.8 lb

## 2020-01-06 DIAGNOSIS — Z8619 Personal history of other infectious and parasitic diseases: Secondary | ICD-10-CM

## 2020-01-06 DIAGNOSIS — J939 Pneumothorax, unspecified: Secondary | ICD-10-CM

## 2020-01-06 DIAGNOSIS — F320 Major depressive disorder, single episode, mild: Secondary | ICD-10-CM

## 2020-01-06 DIAGNOSIS — J209 Acute bronchitis, unspecified: Secondary | ICD-10-CM

## 2020-01-06 DIAGNOSIS — J4540 Moderate persistent asthma, uncomplicated: Secondary | ICD-10-CM | POA: Insufficient documentation

## 2020-01-06 NOTE — Assessment & Plan Note (Signed)
Moderate persistent asthma stable at this time continue inhaled medications

## 2020-01-06 NOTE — Assessment & Plan Note (Signed)
Acute tracheobronchitis has now resolved continue inhaled medications no further steroids or antibiotics needed

## 2020-01-06 NOTE — Patient Instructions (Signed)
Stay on your inhalers as you are taking  Continue gabapentin once daily  Return to work when Dr. [name] releases you to work  Return to see Dr. Delford Field 3 months

## 2020-01-06 NOTE — Assessment & Plan Note (Signed)
Chronic anxiety over health improved with behavioral therapy consultation

## 2020-01-06 NOTE — Progress Notes (Signed)
Patient has been having mild pain on left side incision site from surgery rib cage area took Tylenol for relief

## 2020-01-06 NOTE — Progress Notes (Signed)
Subjective:    Patient ID: Eduardo Moore, male    DOB: [birth year], 39 y.o.   MRN: [id number] Virtual Visit via Telephone Note  History of Present Illness:  11/03/2019 this visit was accomplished with Spanish interpreter [name] This is a 39 year old [place] who presents to the clinic for primary care to establish.  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.  The patient's history dates back to March of this year when after being exposed to ceiling tile that he thought had dust and other collected material that he inhaled precipitated increased breathing difficulties.  Subsequent to that he saw Dr. Chase Caller of pulmonary end of May and found to have a mass in the left lower lobe.  The patient underwent a variety of medical work-ups without a resolved diagnosis.  The patient also has a history of asthma-like symptoms.  He has continued had cough shortness of breath and episodic hemoptysis.  There is been other pulmonary work-ups done including a PET scan in June showing superior segment left lower lobe rounded nodule.  Other work-ups have shown eosinophil count of 300 and normal pulmonary function testing.  The patient subsequently went to [name] for a another opinion September 16, 2019.  He subsequent underwent biopsy and navigational bronchoscopy which was performed with [name] July 22.  Results of this did show aspergilloma on surgical biopsy from the biopsy of the bronc and as well from the micro biology studies.  Since that time the patient is pondering whether he should have a left lower lobe resection or not he was seeking other opinions.  Interestingly when I interviewed this patient with the use of a Spanish interpreter it was apparent no one had ever actually explained to him that his specific condition and also had not really shown him his imaging studies before  The  patient was to see infectious disease however that appointment has not been achieved as of yet.  He does have a return visit this week with [name].  The patient was given a Breo inhaler but he cannot afford it so he is not been taking it.  He does have a as needed albuterol inhaler.  He is still having episodes of hemoptysis.  Patient is also had nausea vomiting and low back pain along with left upper quadrant abdominal pain.  He has been in the emergency room twice for this with negative evaluations.  Again the patient is coming in today to establish for primary care.  Note this patient also is in need of a hepatitis C and HIV assay  12/09/2019  Spanish interpeter [name] from [employer] used  Here for acute work in   I saw the patient in our isolation room with full PPE applied given that the possibility this could be a Covid infection.  Note the patient had mycetoma removed previously on September 3. The patient states he has had symptoms for the past 4 days and his family also have been ill.  His children and family members of all been Covid tested and are negative.  He has gone to a  testing site as of yesterday 12/08/2019.  Unfortunately it is taking 2 to 3 days for results to return and his results are still pending for Covid  The patient states he has had chest pressure increased cough productive of beige-yellow mucus weakness fatigue but no fever.  He has had  nausea GI complaints with diarrhea and increased dyspnea.  He denies hemoptysis.  Note on 3 September he underwent a left lower lobe robotic procedure to remove a mycetoma from the left lower lobe and also  resection of necrotic lung.  The patient denies any loss of taste or smell.  Note a chest x-ray had been obtained earlier today and showed no active process and resolution of left apical pneumothorax volume loss expected from left lower lobe resection.  See operative note lower down in this note.  Note the patient has  received the [place] vaccine for Covid in April and May of this year.  12/15/2019 [facility] follow up visit: This is a telephone visit This patient is seen in short-term follow-up and states his cough is slightly better but is still bringing up considerable amounts of beige mucus notes increased wheezing.  He states his fever is gone.  He still short of breath with exertion.  He is using all his inhalers.  His Covid test was negative.  He still taking his vitamin supplements.  Still using benzonatate if needed for cough  01/06/2020 This patient is seen in return follow-up for aspergilloma resection and postop tracheobronchitis.  Overall the patient is improved.  His chest wall pain is improved from the incision site.  Patient also has less shortness of breath less cough.  He has occasional nausea but no diarrhea or vomiting.  Overall symptom complex is improving.  He is maintaining his inhaled medications.  He is not yet back to work his Neurosurgeon says he will have to wait 3 months before he can return to his work  Past Medical History:  Diagnosis Date  . Anxiety   . Depression   . Dyspnea    when walking  . GERD (gastroesophageal reflux disease)      Family History  Problem Relation Age of Onset  . Asthma Mother      Social History   Socioeconomic History  . Marital status: Single    Spouse name: Not on file  . Number of children: Not on file  . Years of education: Not on file  . Highest education level: Not on file  Occupational History  . Not on file  Tobacco Use  . Smoking status: Never Smoker  . Smokeless tobacco: Never Used  Vaping Use  . Vaping Use: Never used  Substance and Sexual Activity  . Alcohol use: Yes    Comment: occasional  . Drug use: Never  . Sexual activity: Not on file  Other Topics Concern  . Not on file  Social History Narrative  . Not on file   Social Determinants of Health   Financial Resource Strain:   . Difficulty of Paying Living  Expenses: Not on file  Food Insecurity:   . Worried About Charity fundraiser in the Last Year: Not on file  . Ran Out of Food in the Last Year: Not on file  Transportation Needs:   . Lack of Transportation (Medical): Not on file  . Lack of Transportation (Non-Medical): Not on file  Physical Activity:   . Days of Exercise per Week: Not on file  . Minutes of Exercise per Session: Not on file  Stress:   . Feeling of Stress : Not on file  Social Connections:   . Frequency of Communication with Friends and Family: Not on file  . Frequency of Social Gatherings with Friends and Family: Not on file  . Attends Religious Services: Not  on file  . Active Member of Clubs or Organizations: Not on file  . Attends Archivist Meetings: Not on file  . Marital Status: Not on file  Intimate Partner Violence:   . Fear of Current or Ex-Partner: Not on file  . Emotionally Abused: Not on file  . Physically Abused: Not on file  . Sexually Abused: Not on file     Allergies  Allergen Reactions  . Lentil Shortness Of Breath and Rash    lentils  . Pea Shortness Of Breath and Rash    Green Peas     Outpatient Medications Prior to Visit  Medication Sig Dispense Refill  . acetaminophen (TYLENOL) 500 MG tablet Take 500 mg by mouth every 6 (six) hours as needed for mild pain.    Marland Kitchen albuterol (PROVENTIL) (2.5 MG/3ML) 0.083% nebulizer solution Take 2.5 mg by nebulization 2 (two) times daily as needed for wheezing or shortness of breath.    Marland Kitchen albuterol (VENTOLIN HFA) 108 (90 Base) MCG/ACT inhaler Inhale 2 puffs into the lungs every 6 (six) hours as needed for wheezing or shortness of breath.    . ALPRAZolam (XANAX) 0.25 MG tablet Take 1 tablet (0.25 mg total) by mouth 2 (two) times daily as needed for anxiety. 30 tablet 0  . Ascorbic Acid (VITAMIN C) 500 MG CAPS One daily 30 capsule 0  . Cholecalciferol (VITAMIN D) 125 MCG (5000 UT) CAPS One daily 30 capsule 0  . fluticasone furoate-vilanterol (BREO  ELLIPTA) 200-25 MCG/INH AEPB Inhale 1 puff into the lungs daily. 60 each 11  . gabapentin (NEURONTIN) 300 MG capsule Take 1 capsule (300 mg total) by mouth once a week for 1 day, THEN 1 capsule (300 mg total) 2 (two) times daily for 1 day, THEN 1 capsule (300 mg total) 3 (three) times daily. (Patient taking differently: Take one capsule at bedtime) 90 capsule 1  . omeprazole (PRILOSEC) 40 MG capsule Take 1 capsule (40 mg total) by mouth 2 (two) times daily. To reduce stomach acid 60 capsule 2  . ondansetron (ZOFRAN) 4 MG tablet Take 1 tablet (4 mg total) by mouth every 8 (eight) hours as needed for nausea or vomiting. 30 tablet 5  . polyethylene glycol (MIRALAX) 17 g packet Take 17 g by mouth daily. 14 each 0  . zinc gluconate 50 MG tablet Take 1 tablet (50 mg total) by mouth daily. 30 tablet 0  . benzonatate (TESSALON) 200 MG capsule Take 1 capsule (200 mg total) by mouth 2 (two) times daily as needed for cough. (Patient not taking: Reported on 12/30/2019) 20 capsule 0  . predniSONE (DELTASONE) 10 MG tablet Take 4 tablets daily for 5 days then stop (Patient not taking: Reported on 12/30/2019) 20 tablet 0   No facility-administered medications prior to visit.      Review of Systems  Constitutional: Positive for fatigue.  HENT: Negative.  Negative for nosebleeds, postnasal drip, rhinorrhea, sinus pressure, sinus pain, sneezing, sore throat, tinnitus and trouble swallowing.   Eyes: Negative.   Respiratory: Positive for cough. Negative for chest tightness, shortness of breath and wheezing.   Cardiovascular: Negative for chest pain.  Gastrointestinal: Positive for nausea. Negative for abdominal pain, anal bleeding, blood in stool, constipation, diarrhea, rectal pain and vomiting.  Endocrine: Negative.   Genitourinary: Negative for flank pain.  Musculoskeletal: Negative for back pain.  Skin: Negative.   Neurological: Negative.   Hematological: Negative.   Psychiatric/Behavioral: Negative.         Objective:  Physical Exam Vitals:   01/06/20 1138  BP: (!) 146/76  Pulse: 87  Temp: 97.9 F (36.6 C)  SpO2: 95%  Weight: 201 lb 12.8 oz (91.5 kg)  Height: 5' 7.5" (1.715 m)    Gen: Pleasant, well-nourished, in no distress,  normal affect  ENT: No lesions,  mouth clear,  oropharynx clear, no postnasal drip  Neck: No JVD, no TMG, no carotid bruits  Lungs: No use of accessory muscles, no dullness to percussion, clear without rales or rhonchi  Cardiovascular: RRR, heart sounds normal, no murmur or gallops, no peripheral edema  Abdomen: soft and NT, no HSM,  BS normal  Musculoskeletal: No deformities, no cyanosis or clubbing  Neuro: alert, non focal  Skin: Warm, no lesions or rashes   All labs reviewed  DESCRIPTION OF PROCEDURE:  The patient was brought to the operating room on 10/02/2019.  He had induction of general anesthesia and was intubated.  A timeout was performed.  Flexible fiberoptic bronchoscopy was performed via the endotracheal tube.  It  revealed normal endobronchial anatomy with no endobronchial lesions to the level of the subsegmental bronchi.  There were some thick, clear secretions in the superior segmental bronchus.  Planning for the procedure had been done on the computer preoperatively.  The locatable guide for navigation was placed and registration was performed.  The bronchoscope then was directed to the left lower lobe bronchus and the locatable guide was advanced into the superior segmental bronchus, into the  appropriate subsegmental bronchus and then to within a centimeter of the center of the lesion with good alignment.  Position was confirmed with fluoroscopy.  Sampling then was performed.  All sampling was performed with fluoroscopy.  Needle aspirations  were performed.  The first 2 were placed on the slides.  The third aspiration was placed only in the cell block.  Next, 3 needle brushings were performed and then multiple biopsies were  obtained.  Biopsies were sent for both AFB and fungal cultures, as  well as for permanent pathology.  The quick preps on the needle aspirations and brushings showed acute inflammation, no tumor cells were seen, nor were there any definite granulomas seen.  There was some mild bleeding with the biopsies.  After obtaining  sufficient biopsies for both pathology and cultures,  bronchoalveolar lavage was performed. 100 mL of saline was instilled and 10 mL was obtained with aspiration.  This was sent for AFB and fungal cultures as well.  A final inspection was made with the  bronchoscope and there was no ongoing bleeding.  The total fluoroscopy time was 4.5 minutes with a total dose of 42 milligray.  The patient then was extubated in the operating room and taken to the [facility] in good condition.   LEFT LOWER LOBE SPECIMEN C  Performed at [facility], [place] [address], [place], [place] [address]   Culture RARE FUNGUS (MOLD) ISOLATED, PROBABLE CONTAMINANT/COLONIZER (SAPROPHYTE). CONTACT MICROBIOLOGY IF FURTHER IDENTIFICATION REQUIRED [phone].    7/22 FINAL MICROSCOPIC DIAGNOSIS:   A. LUNG, LEFT LOWER LOBE, BIOPSY:  - Aspergilloma, see comment   B. LUNG, LEFT LOWER LOBE, BIOPSY:  - Aspergilloma, see comment    COMMENT:   A and B. GMS stain is confirmatory    DATE OF PROCEDURE:  11/14/2019  PREOPERATIVE DIAGNOSIS:  Aspergilloma left lower lobe with hemoptysis.  POSTOPERATIVE DIAGNOSIS:  Aspergilloma left lower lobe with hemoptysis.  PROCEDURE:   Xi robotic video-assisted left thoracoscopy Lysis of adhesions, Left lower lobectomy,  Lymph node dissection, and Intercostal nerve blocks levels 3 through 10.  SURGEON:  Modesto Charon, MD  ASSISTANT:  Nicholes Rough, PA-C  ANESTHESIA:  General.  FINDINGS:  Severe adhesions throughout the pleural space.  Mass clearly visible in superior segment.       Assessment & Plan:  I personally reviewed all  images and lab data in the [facility] system as well as any outside material available during this office visit and agree with the  radiology impressions.   Acute tracheobronchitis Acute tracheobronchitis has now resolved continue inhaled medications no further steroids or antibiotics needed  Pneumothorax Postoperative pneumothorax has resolved  History of aspergilloma Status post resection for aspergilloma stable at this time resolution of same  Depression Chronic anxiety over health improved with behavioral therapy consultation  Asthma, moderate persistent Moderate persistent asthma stable at this time continue inhaled medications   Makai was seen today for follow-up.  Diagnoses and all orders for this visit:  Acute tracheobronchitis  Pneumothorax, unspecified type  History of aspergilloma  Current mild episode of major depressive disorder without prior episode (HCC)  Moderate persistent asthma without complication

## 2020-01-06 NOTE — Assessment & Plan Note (Signed)
Status post resection for aspergilloma stable at this time resolution of same

## 2020-01-06 NOTE — BH Specialist Note (Signed)
Integrated Behavioral Health Initial Visit  MRN: [id number] Name: Eduardo Moore  Number of Integrated Behavioral Health Clinician visits:: 1/6 Session Start time: 10:20 AM  Session End time: 10:45 AM Total time: 25  Type of Service: Integrated Behavioral Health- Individual Interpretor:No. Interpretor Name and Language: NA   SUBJECTIVE: Eduardo Moore is a 39 y.o. male accompanied by self Patient was referred by Dr. Delford Field for anxiety. Patient reports the following symptoms/concerns: Pt reports increase in anxiety due to ongoing medical conditions that have required surgery. During this time, pt's spouse was hospitalized for mental health conditions, which has increased stress Duration of problem: 1 month; Severity of problem: moderate  OBJECTIVE: Mood: Anxious and Affect: Appropriate Risk of harm to self or others: No plan to harm self or others  LIFE CONTEXT: Family and Social: Pt receives support from family School/Work: Pt has been out of work for approx 3-4 months resulting in financial strain Self-Care: Pt has been utilizing Chief Financial Officer to assist with financial support Life Changes: Pt reports difficulty managing anxiety symptoms triggered by psychosocial stressors   GOALS ADDRESSED: Patient will: 1. Increase knowledge and/or ability of: coping skills Pt agreed to continue walking around the house (minimal exercise) and reading to cope with stressors   INTERVENTIONS: Interventions utilized: Mindfulness or Management consultant, Supportive Counseling and Link to [employer]  Standardized Assessments completed: Not Needed  ASSESSMENT: Patient currently experiencing difficulty managing anxiety symptoms triggered by ongoing medical conditions and psychosocial stressors.   Patient may benefit from utilizing healthy coping skills identified to assist in management and/or decrease in symptoms. Pt receives support from family and local  community agency to assist with financial strain. LCSW completed referral to Legal Aid, per pt request.  PLAN: 1. Follow up with behavioral health clinician on : Contact LCSW with any additional behavioral health and/or resource needs 2. Behavioral recommendations: Utilize strategies discussed and follow up with Legal Aid 3. Referral(s): Integrated Art gallery manager (In Clinic) and [employer]:  Finances 4. "From scale of 1-10, how likely are you to follow plan?":   Bridgett Larsson, LCSW 01/06/20 12:42 AM

## 2020-01-06 NOTE — Assessment & Plan Note (Signed)
Postoperative pneumothorax has resolved

## 2020-01-13 ENCOUNTER — Ambulatory Visit: Payer: Self-pay | Admitting: Critical Care Medicine

## 2020-01-20 ENCOUNTER — Telehealth: Payer: Self-pay

## 2020-01-20 ENCOUNTER — Other Ambulatory Visit: Payer: Self-pay | Admitting: Critical Care Medicine

## 2020-01-20 ENCOUNTER — Other Ambulatory Visit: Payer: Self-pay | Admitting: Thoracic Surgery (Cardiothoracic Vascular Surgery)

## 2020-01-20 MED ORDER — GABAPENTIN 300 MG PO CAPS: 300.0000 mg | ORAL_CAPSULE | Freq: Every day | ORAL | 0 refills | Status: DC

## 2020-01-20 MED FILL — GABAPENTIN 300 MG CAPSULE: 300 | 42 days supply | Qty: 42 | Fill #0

## 2020-01-20 NOTE — Telephone Encounter (Signed)
-----   Message from Loreli Slot, MD sent at 01/20/2020 12:22 PM EST ----- Regarding: RE: Gabapentin refill Give him another 6 weeks worth  [facility] ----- Message ----- From: Steve Rattler, RN Sent: 01/20/2020  11:56 AM EST To: Loreli Slot, MD Subject: Gabapentin refill                              Hey,  He is requesting a refill.  Noticed in your last note you wanted him to continue for 6 more weeks.  Would you like me to refill this?  He does have a f/u with you at the end of the month.  Thanks,  Morrie Sheldon

## 2020-01-20 NOTE — Telephone Encounter (Signed)
   Notes to clinic: review for refill Medication filled by a different provider   Requested Prescriptions  Pending Prescriptions Disp Refills   fluticasone furoate-vilanterol (BREO ELLIPTA) 200-25 MCG/INH AEPB 60 each 2    Sig: Inhale 1 puff into the lungs daily.      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[facility])   [facility] Storm Frisk, MD       Future Appointments             In 2 months Storm Frisk, MD [facility]              gabapentin (NEURONTIN) 300 MG capsule 90 capsule 0    Sig: Take one capsule at bedtime      Neurology: Anticonvulsants - gabapentin Passed - 01/20/2020 11:13 AM      Passed - Valid encounter within last 12 months    Recent Outpatient Visits           2 weeks ago Acute tracheobronchitis   [facility] Storm Frisk, MD   1 month ago Acute tracheobronchitis   [facility] Storm Frisk, MD   1 month ago Hospital discharge follow-up   [facility] Cain Saupe, MD   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)   [facility] Storm Frisk, MD       Future Appointments             In 2 months Storm Frisk, MD [facility]

## 2020-01-20 NOTE — Telephone Encounter (Signed)
PT need a refill  fluticasone furoate-vilanterol (BREO ELLIPTA) 200-25 MCG/INH AEPB [[id number]]  gabapentin (NEURONTIN) 300 MG capsule [[id number]]  [facility] [place], [place] - [place] [address]  [address] [place] [address]  Phone: [phone] Fax: [phone]

## 2020-01-20 NOTE — Addendum Note (Signed)
Addended by: Lisabeth Pick on: 01/20/2020 11:13 AM   Modules accepted: Orders

## 2020-01-20 NOTE — Addendum Note (Signed)
Addended by: Lisabeth Pick on: 01/20/2020 11:09 AM   Modules accepted: Orders

## 2020-01-21 ENCOUNTER — Other Ambulatory Visit: Payer: Self-pay

## 2020-01-21 ENCOUNTER — Ambulatory Visit: Payer: Self-pay | Attending: Critical Care Medicine

## 2020-01-21 MED FILL — BREO ELLIPTA 200-25 MCG INH: 200-25 | 30 days supply | Qty: 60 | Fill #1

## 2020-01-21 NOTE — Addendum Note (Signed)
Addended by: Lois Huxley, Jeannett Senior L on: 01/21/2020 02:27 PM   Modules accepted: Orders

## 2020-01-28 ENCOUNTER — Telehealth: Payer: Self-pay | Admitting: Licensed Clinical Social Worker

## 2020-01-28 NOTE — Telephone Encounter (Signed)
Follow up call placed to patient. Patient shared that he is doing well. He has returned to work to assist with financial strain. Pt reported that [employer] was unable to assist with his claim against employer; however, referred him to an agency that may help.   Pt agreed to schedule a follow up meeting with LCSW for 02/19/2020.

## 2020-02-04 ENCOUNTER — Other Ambulatory Visit: Payer: Self-pay | Admitting: Thoracic Surgery (Cardiothoracic Vascular Surgery)

## 2020-02-04 DIAGNOSIS — Z9889 Other specified postprocedural states: Secondary | ICD-10-CM

## 2020-02-10 ENCOUNTER — Ambulatory Visit
Admission: RE | Admit: 2020-02-10 | Discharge: 2020-02-10 | Disposition: A | Discharge status: Home / Self Care | Payer: Self-pay | Source: Ambulatory Visit | Attending: Thoracic Surgery (Cardiothoracic Vascular Surgery) | Admitting: Thoracic Surgery (Cardiothoracic Vascular Surgery)

## 2020-02-10 ENCOUNTER — Ambulatory Visit (INDEPENDENT_AMBULATORY_CARE_PROVIDER_SITE_OTHER): Payer: Self-pay | Admitting: Thoracic Surgery (Cardiothoracic Vascular Surgery)

## 2020-02-10 ENCOUNTER — Other Ambulatory Visit: Payer: Self-pay

## 2020-02-10 ENCOUNTER — Encounter: Payer: Self-pay | Admitting: Thoracic Surgery (Cardiothoracic Vascular Surgery)

## 2020-02-10 VITALS — BP 131/79 | HR 95 | Temp 98.1°F | Resp 20 | Ht 68.0 in | Wt 206.4 lb

## 2020-02-10 DIAGNOSIS — Z9889 Other specified postprocedural states: Secondary | ICD-10-CM

## 2020-02-10 NOTE — Progress Notes (Signed)
[address]       [name] [address]             [phone]       HPI: [name] returns for a scheduled follow-up.  Eduardo Moore is a 39 year old man who presented with wheezing and hemoptysis.  He was found to have a left lower lobe lung mass.  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  He also had multiple other cavitary lesions in the left lower lobe.  He had a robotic left lower lobectomy on 11/14/2019.  His early postoperative course was uncomplicated and he went home on day 4.  He had a lot of neuropathic pain in the early postoperative period.  He had significant improvement with gabapentin.  He initially ramped up to a dose of 300 mg 3 times a day, but fairly quickly taper back down to 300 mg daily.  He continues to have pain and laxity of the left upper quadrant abdominal musculature.  He is not having to take anything other than the gabapentin and Tylenol.  Does get some shortness of breath when he is exerting himself.  Currently between jobs and trying to figure out what type of work he can do.  Past Medical History:  Diagnosis Date  . Anxiety   . Depression   . Dyspnea    when walking  . GERD (gastroesophageal reflux disease)      Current Outpatient Medications  Medication Sig Dispense Refill  . acetaminophen (TYLENOL) 500 MG tablet Take 500 mg by mouth every 6 (six) hours as needed for mild pain.    Marland Kitchen albuterol (PROVENTIL) (2.5 MG/3ML) 0.083% nebulizer solution Take 2.5 mg by nebulization 2 (two) times daily as needed for wheezing or shortness of breath.    Marland Kitchen albuterol (VENTOLIN HFA) 108 (90 Base) MCG/ACT inhaler Inhale 2 puffs into the lungs every 6 (six) hours as needed for wheezing or shortness of breath.    . ALPRAZolam (XANAX) 0.25 MG tablet Take 1 tablet (0.25 mg total) by mouth 2 (two) times daily as needed for anxiety. 30 tablet 0  . Ascorbic Acid (VITAMIN C) 500 MG CAPS One daily 30 capsule 0  . Cholecalciferol (VITAMIN  D) 125 MCG (5000 UT) CAPS One daily 30 capsule 0  . fluticasone furoate-vilanterol (BREO ELLIPTA) 200-25 MCG/INH AEPB Inhale 1 puff into the lungs daily. 60 each 11  . gabapentin (NEURONTIN) 300 MG capsule Take 1 capsule (300 mg total) by mouth daily. 42 capsule 0  . omeprazole (PRILOSEC) 40 MG capsule Take 1 capsule (40 mg total) by mouth 2 (two) times daily. To reduce stomach acid 60 capsule 2  . ondansetron (ZOFRAN) 4 MG tablet Take 1 tablet (4 mg total) by mouth every 8 (eight) hours as needed for nausea or vomiting. 30 tablet 5  . polyethylene glycol (MIRALAX) 17 g packet Take 17 g by mouth daily. 14 each 0  . zinc gluconate 50 MG tablet Take 1 tablet (50 mg total) by mouth daily. 30 tablet 0   No current facility-administered medications for this visit.    Physical Exam BP 131/79 (BP Location: Right Arm, Patient Position: Sitting, Cuff Size: Large)   Pulse 95   Temp 98.1 F (36.7 C) (Skin)   Resp 20   Ht 5\' 8"  (1.727 m)   Wt 206 lb 6.4 oz (93.6 kg)   SpO2 95% Comment: RA  BMI 31.93 kg/m  39 year old male in no acute distress  Alert and oriented x3 Lungs diminished at left base but otherwise clear Incisions well-healed Muscular atrophy left upper quadrant  Diagnostic Tests: CHEST - 2 VIEW  COMPARISON:  12/09/2019 chest radiograph and prior.  FINDINGS: Left lung postsurgical sequela with blunting of the left costophrenic sulcus. No pneumothorax or right pleural effusion. Cardiomediastinal silhouette is unchanged.  IMPRESSION: Postsurgical sequela of the left lung.  No focal airspace disease.   Electronically Signed   By: Stana Bunting M.D.   On: 02/10/2020 10:19 I personally reviewed the chest x-ray concur with the findings noted above.  Impression: Eduardo Moore is a 39 year old man with a past history of anxiety, depression, reflux, and "asthma".  He presented with shortness of breath and hemoptysis.  He was found to have a left lower lobe  lung mass.  There were multiple other cavitary lesions in the left lower lobe as well.  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  He underwent a robotic left lower lobectomy about 3 months ago.  His initial postoperative course was uncomplicated but he developed intercostal neuralgia.  That responded to gabapentin.  He initially was up to 300 mg 3 times daily but self tapered down to 300mg  once a day.  His symptoms are well controlled but not completely resolved with that dose.  As he does have some symptoms still I think we should keep him on the gabapentin for now.  We will plan to continue that for 3 months.  I will see him back then and will decide whether or not to discontinue at that time.  I encouraged him to really start to push himself physically.  There are no restrictions on his activities.  It will take him some time to adapt and his respiratory capacity will never be 100% of what it was prior to surgery.  However, he eventually should be able to do almost everything he was able to do before.  Plan: Continue gabapentin 300 mg daily Return in 3 months with PA and lateral chest x-ray  , MD [facility] [phone]

## 2020-02-19 ENCOUNTER — Other Ambulatory Visit: Payer: Self-pay

## 2020-02-19 ENCOUNTER — Ambulatory Visit: Payer: Self-pay | Admitting: Licensed Clinical Social Worker

## 2020-02-26 ENCOUNTER — Other Ambulatory Visit: Payer: Self-pay | Admitting: Critical Care Medicine

## 2020-02-26 ENCOUNTER — Other Ambulatory Visit: Payer: Self-pay

## 2020-02-26 ENCOUNTER — Ambulatory Visit: Payer: Self-pay | Attending: Critical Care Medicine | Admitting: Licensed Clinical Social Worker

## 2020-02-26 DIAGNOSIS — F4323 Adjustment disorder with mixed anxiety and depressed mood: Secondary | ICD-10-CM

## 2020-02-26 NOTE — Telephone Encounter (Signed)
Medication:  fluticasone furoate-vilanterol (BREO ELLIPTA) 200-25 MCG/INH AEPB [[id number]]   Has the patient contacted their pharmacy? no (Agent: If no, request that the patient contact the pharmacy for the refill.)   Preferred Pharmacy (with phone number or street name):  [facility] - [place], [place] - [place] [address]  [address], [place] [address]  Phone:  [phone] Fax:  [phone]  Agent: Please be advised that RX refills may take up to 3 business days. We ask that you follow-up with your pharmacy.

## 2020-02-26 NOTE — Progress Notes (Signed)
Follow up call placed to patient. Patient reports "everything is going well" He reports decrease in pain, anxiety, and depression. Pt has been intentional in spending time with family and increasing physical activity (walking) He has been utilizing healthy coping skills (reading and playing music) to promote positive mood. Pt plans on going back to work next month.   LCSW commended patient on utilizing healthy strategies discussed at initial [facility] appointment. Pt was reminded of upcoming appointment with Dr. Delford Field scheduled for 03/31/2019. Pt was appreciative and no additional concerns noted.

## 2020-02-26 NOTE — Telephone Encounter (Signed)
Using [employer] ID [id number]. Patient called and advised there are refills at the pharmacy and if he had called them. He says he has not called the pharmacy number I provided, but he will call for the refill.

## 2020-02-27 ENCOUNTER — Other Ambulatory Visit: Payer: Self-pay | Admitting: Thoracic Surgery (Cardiothoracic Vascular Surgery)

## 2020-02-27 MED FILL — GABAPENTIN 300 MG CAPSULE: 300 | 30 days supply | Qty: 30 | Fill #0

## 2020-02-27 MED FILL — OMEPRAZOLE DR 40 MG CAPSULE: 40 | 30 days supply | Qty: 30 | Fill #1

## 2020-02-27 MED FILL — $BREO ELLIPTA 200-25 MCG IN: 200-25 | 30 days supply | Qty: 60 | Fill #2

## 2020-03-16 ENCOUNTER — Other Ambulatory Visit: Payer: Self-pay | Admitting: Physician Assistant

## 2020-03-16 ENCOUNTER — Ambulatory Visit: Payer: Self-pay | Attending: Physician Assistant | Admitting: Physician Assistant

## 2020-03-16 ENCOUNTER — Encounter: Payer: Self-pay | Admitting: Physician Assistant

## 2020-03-16 ENCOUNTER — Ambulatory Visit: Payer: Self-pay | Admitting: *Deleted

## 2020-03-16 ENCOUNTER — Other Ambulatory Visit: Payer: Self-pay

## 2020-03-16 DIAGNOSIS — R110 Nausea: Secondary | ICD-10-CM

## 2020-03-16 DIAGNOSIS — Z789 Other specified health status: Secondary | ICD-10-CM

## 2020-03-16 DIAGNOSIS — Z20822 Contact with and (suspected) exposure to covid-19: Secondary | ICD-10-CM

## 2020-03-16 DIAGNOSIS — R062 Wheezing: Secondary | ICD-10-CM

## 2020-03-16 MED ORDER — ONDANSETRON HCL 4 MG PO TABS: 4.0000 mg | ORAL_TABLET | Freq: Three times a day (TID) | ORAL | 0 refills | Status: DC | PRN

## 2020-03-16 MED ORDER — ALBUTEROL SULFATE HFA 108 (90 BASE) MCG/ACT IN AERS: 2.0000 | INHALATION_SPRAY | Freq: Four times a day (QID) | RESPIRATORY_TRACT | 1 refills | Status: DC | PRN

## 2020-03-16 MED FILL — ONDANSETRON HCL 4 MG TABLET: 4 | 10 days supply | Qty: 30 | Fill #0

## 2020-03-16 MED FILL — ALBUTEROL SULFATE HFA 108 (: 108 (90 BAS | 25 days supply | Qty: 18 | Fill #0

## 2020-03-16 NOTE — Telephone Encounter (Signed)
Patient had a virtual visit today with provider 

## 2020-03-16 NOTE — Telephone Encounter (Signed)
Pt called in c/o chills, shortness of breath, sore throat, coughing, "palpitations".  No cardiac history.   Had cardiac work up 4 mo. Ago prior to having a lobectomy.   He sees Dr. Delford Field for this.  See triage notes.   Dr. Delford Field was booked so I got him a MyChart video appt for today at 10:10 with Dr. Georgian Co.  I went over the care advice and encouraged him to be tested for covid along with his family since they are all sick with "colds" also.  Pt has had both Pfizer vaccines. I sent my notes to [facility] for Dr. Vassie Moment information.    Reason for Disposition . HIGH RISK for severe COVID complications (e.g., age > 64 years, obesity with BMI > 25, pregnant, chronic lung disease or other chronic medical condition)  (Exception: Already seen by PCP and no new or worsening symptoms.)    Had a lobectomy 4 mo. ago  Answer Assessment - Initial Assessment Questions 1. COVID-19 DIAGNOSIS: "Who made your COVID-19 diagnosis?" "Was it confirmed by a positive lab test?" If not diagnosed by a HCP, ask "Are there lots of cases (community spread) where you live?" Note: See public health department website, if unsure.   Pt called in with Spanish interpreter.   Pt c/o family having a cold for a couple of days.  Symptoms since Friday.   2. COVID-19 EXPOSURE: "Was there any known exposure to COVID before the symptoms began?" CDC Definition of close contact: within 6 feet (2 meters) for a total of 15 minutes or more over a 24-hour period.      Family has had a cold for a couple of days. 3. ONSET: "When did the COVID-19 symptoms start?"      Friday for pt symptoms began: Sore throat first, light headed,dizzy and nausea, nausea for a couple of days.  I thought I was doing better but I'm not getting better.  I'm coughing green sputum.   'None of the family members have been tested for covid.    4. WORST SYMPTOM: "What is your worst symptom?" (e.g., cough, fever, shortness of  breath, muscle aches)     I feel a little short of breath.   I use the nebulizer.   5. COUGH: "Do you have a cough?" If Yes, ask: "How bad is the cough?"       Yes    I was having asthma symptoms.   I had a lower lobectomy done 4 months ago .6. FEVER: "Do you have a fever?" If Yes, ask: "What is your temperature, how was it measured, and when did it start?"     No I'm sweating at night.  Don't have a thermometer.   I'm having chills. 7. RESPIRATORY STATUS: "Describe your breathing?" (e.g., shortness of breath, wheezing, unable to speak)      I feel like when I'm sitting I'm having shortness of breath and my heart is having palpitations.  "I have no color in my face per my family". 8. BETTER-SAME-WORSE: "Are you getting better, staying the same or getting worse compared to yesterday?"  If getting worse, ask, "In what way?"     No etter 9. HIGH RISK DISEASE: "Do you have any chronic medical problems?" (e.g., asthma, heart or lung disease, weak immune system, obesity, etc.)     Asthma. Before surgery all the heart tests were fine 4 months ago. I feel like my chest is tight and my mouth is very  dry.  I also feel agitated.  Denies having diabetes.   No diarrhea 10. VACCINE: "Have you gotten the COVID-19 vaccine?" If Yes ask: "Which one, how many shots, when did you get it?"       Yes with 2 doses of Pfizer.  The last shot was in May.   11. PREGNANCY: "Is there any chance you are pregnant?" "When was your last menstrual period?"       N/A 12. OTHER SYMPTOMS: "Do you have any other symptoms?"  (e.g., chills, fatigue, headache, loss of smell or taste, muscle pain, sore throat; new loss of smell or taste especially support the diagnosis of COVID-19)       See above chills, sore throat, coughing, I'm having pain in my abd.   This feeling is different from when I had the flu in the past.  I can smell/taste but no appetite due to nausea.  Protocols used: CORONAVIRUS (COVID-19) DIAGNOSED OR  SUSPECTED-A-AH

## 2020-03-16 NOTE — Progress Notes (Signed)
Patient ID: Eduardo Moore, male   DOB: 1980/04/08, 40 y.o.   MRN: [id number] Virtual Visit via Telephone Note  I connected with Concepcion Rosales Moore on 03/16/20 at 10:10 AM EST by telephone and verified that I am speaking with the correct person using two identifiers.  Location: Patient: Eduardo Moore Provider: Georgian Co, PA-C   I discussed the limitations, risks, security and privacy concerns of performing an evaluation and management service by telephone and the availability of in person appointments. I also discussed with the patient that there may be a patient responsible charge related to this service. The patient expressed understanding and agreed to proceed.  PATIENT visit by telephone virtually in the context of Covid-19 pandemic. Patient location: home My Location:  [facility] office Persons on the call: screened by Carolynne Edouard, the patient, interpreter Tyler Aas) and me  History of Present Illness: Started with sore throat, SOB, ear congestion, chills, feels weak, laryngitis, and dizziness.  Symptoms started 4-5 days ago.  +nausea.  Poor appetite.  +taste and smell in tact.    He has had covid vaccines X2.     Observations/Objective:  NAD.  A&Ox3   Assessment and Plan: 1. Suspected COVID-19 virus infection Stay hydrated, quaranatine.   - Novel Coronavirus, NAA ([facility])  2. Nausea -stay hydrated - ondansetron (ZOFRAN) 4 MG tablet; Take 1 tablet (4 mg total) by mouth every 8 (eight) hours as needed for nausea or vomiting.  Dispense: 30 tablet; Refill: 0  3. Wheezing - albuterol (VENTOLIN HFA) 108 (90 Base) MCG/ACT inhaler; Inhale 2 puffs into the lungs every 6 (six) hours as needed for wheezing or shortness of breath.  Dispense: 18 g; Refill: 1  4.  Language barrier- pacific interpreters used and additional time performing visit was required.   Follow Up Instructions: To ED or UC if worsens at all.  Patient verbalizes understanding   I discussed the  assessment and treatment plan with the patient. The patient was provided an opportunity to ask questions and all were answered. The patient agreed with the plan and demonstrated an understanding of the instructions.   The patient was advised to call back or seek an in-person evaluation if the symptoms worsen or if the condition fails to improve as anticipated.  I provided 21 minutes of non-face-to-face time during this encounter.   Georgian Co, PA-C

## 2020-03-30 ENCOUNTER — Telehealth: Payer: Self-pay | Admitting: Critical Care Medicine

## 2020-03-30 NOTE — Progress Notes (Deleted)
Subjective:    Patient ID: Eduardo Moore, male    DOB: [birth year], 40 y.o.   MRN: [id number] Virtual Visit via Telephone Note  History of Present Illness:  11/03/2019 this visit was accomplished with Spanish interpreter [name] This is a 40 year old [place] who presents to the clinic for primary care to establish.  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.  The patient's history dates back to March of this year when after being exposed to ceiling tile that he thought had dust and other collected material that he inhaled precipitated increased breathing difficulties.  Subsequent to that he saw Dr. Chase Caller of pulmonary end of May and found to have a mass in the left lower lobe.  The patient underwent a variety of medical work-ups without a resolved diagnosis.  The patient also has a history of asthma-like symptoms.  He has continued had cough shortness of breath and episodic hemoptysis.  There is been other pulmonary work-ups done including a PET scan in June showing superior segment left lower lobe rounded nodule.  Other work-ups have shown eosinophil count of 300 and normal pulmonary function testing.  The patient subsequently went to [name] for a another opinion September 16, 2019.  He subsequent underwent biopsy and navigational bronchoscopy which was performed with [name] July 22.  Results of this did show aspergilloma on surgical biopsy from the biopsy of the bronc and as well from the micro biology studies.  Since that time the patient is pondering whether he should have a left lower lobe resection or not he was seeking other opinions.  Interestingly when I interviewed this patient with the use of a Spanish interpreter it was apparent no one had ever actually explained to him that his specific condition and also had not really shown him his imaging studies before  The  patient was to see infectious disease however that appointment has not been achieved as of yet.  He does have a return visit this week with [name].  The patient was given a Breo inhaler but he cannot afford it so he is not been taking it.  He does have a as needed albuterol inhaler.  He is still having episodes of hemoptysis.  Patient is also had nausea vomiting and low back pain along with left upper quadrant abdominal pain.  He has been in the emergency room twice for this with negative evaluations.  Again the patient is coming in today to establish for primary care.  Note this patient also is in need of a hepatitis C and HIV assay  12/09/2019  Spanish interpeter [name] from [employer] used  Here for acute work in   I saw the patient in our isolation room with full PPE applied given that the possibility this could be a Covid infection.  Note the patient had mycetoma removed previously on September 3. The patient states he has had symptoms for the past 4 days and his family also have been ill.  His children and family members of all been Covid tested and are negative.  He has gone to a  testing site as of yesterday 12/08/2019.  Unfortunately it is taking 2 to 3 days for results to return and his results are still pending for Covid  The patient states he has had chest pressure increased cough productive of beige-yellow mucus weakness fatigue but no fever.  He has had  nausea GI complaints with diarrhea and increased dyspnea.  He denies hemoptysis.  Note on 3 September he underwent a left lower lobe robotic procedure to remove a mycetoma from the left lower lobe and also  resection of necrotic lung.  The patient denies any loss of taste or smell.  Note a chest x-ray had been obtained earlier today and showed no active process and resolution of left apical pneumothorax volume loss expected from left lower lobe resection.  See operative note lower down in this note.  Note the patient has  received the [place] vaccine for Covid in April and May of this year.  12/15/2019 [facility] follow up visit: This is a telephone visit This patient is seen in short-term follow-up and states his cough is slightly better but is still bringing up considerable amounts of beige mucus notes increased wheezing.  He states his fever is gone.  He still short of breath with exertion.  He is using all his inhalers.  His Covid test was negative.  He still taking his vitamin supplements.  Still using benzonatate if needed for cough  01/06/2020 This patient is seen in return follow-up for aspergilloma resection and postop tracheobronchitis.  Overall the patient is improved.  His chest wall pain is improved from the incision site.  Patient also has less shortness of breath less cough.  He has occasional nausea but no diarrhea or vomiting.  Overall symptom complex is improving.  He is maintaining his inhaled medications.  He is not yet back to work his Neurosurgeon says he will have to wait 3 months before he can return to his work  03/30/2020  Acute tracheobronchitis Acute tracheobronchitis has now resolved continue inhaled medications no further steroids or antibiotics needed  Pneumothorax Postoperative pneumothorax has resolved  History of aspergilloma Status post resection for aspergilloma stable at this time resolution of same  Depression Chronic anxiety over health improved with behavioral therapy consultation  Asthma, moderate persistent Moderate persistent asthma stable at this time continue inhaled medications   Eashan was seen today for follow-up.  Diagnoses and all orders for this visit:  Acute tracheobronchitis  Pneumothorax, unspecified type  History of aspergilloma  Current mild episode of major depressive disorder without prior episode (HCC)  Moderate persistent asthma without complication     Past Medical History:  Diagnosis Date  . Anxiety   . Depression   .  Dyspnea    when walking  . GERD (gastroesophageal reflux disease)      Family History  Problem Relation Age of Onset  . Asthma Mother      Social History   Socioeconomic History  . Marital status: Single    Spouse name: Not on file  . Number of children: Not on file  . Years of education: Not on file  . Highest education level: Not on file  Occupational History  . Not on file  Tobacco Use  . Smoking status: Never Smoker  . Smokeless tobacco: Never Used  Vaping Use  . Vaping Use: Never used  Substance and Sexual Activity  . Alcohol use: Yes    Comment: occasional  . Drug use: Never  . Sexual activity: Not on file  Other Topics Concern  . Not on file  Social History Narrative  . Not on file   Social Determinants of Health   Financial Resource Strain: Not on file  Food Insecurity: Not on file  Transportation Needs: Not on file  Physical Activity: Not on file  Stress: Not on file  Social Connections: Not on file  Intimate Partner Violence: Not on file     Allergies  Allergen Reactions  . Lentil Shortness Of Breath and Rash    lentils  . Pea Shortness Of Breath and Rash    Green Peas     Outpatient Medications Prior to Visit  Medication Sig Dispense Refill  . acetaminophen (TYLENOL) 500 MG tablet Take 500 mg by mouth every 6 (six) hours as needed for mild pain.    Marland Kitchen albuterol (PROVENTIL) (2.5 MG/3ML) 0.083% nebulizer solution Take 2.5 mg by nebulization 2 (two) times daily as needed for wheezing or shortness of breath.    Marland Kitchen albuterol (VENTOLIN HFA) 108 (90 Base) MCG/ACT inhaler Inhale 2 puffs into the lungs every 6 (six) hours as needed for wheezing or shortness of breath. 18 g 1  . ALPRAZolam (XANAX) 0.25 MG tablet Take 1 tablet (0.25 mg total) by mouth 2 (two) times daily as needed for anxiety. 30 tablet 0  . Ascorbic Acid (VITAMIN C) 500 MG CAPS One daily 30 capsule 0  . Cholecalciferol (VITAMIN D) 125 MCG (5000 UT) CAPS One daily 30 capsule 0  .  fluticasone furoate-vilanterol (BREO ELLIPTA) 200-25 MCG/INH AEPB Inhale 1 puff into the lungs daily. 60 each 11  . gabapentin (NEURONTIN) 300 MG capsule TAKE 1 CAPSULE (300 MG TOTAL) BY MOUTH DAILY. 42 capsule 0  . omeprazole (PRILOSEC) 40 MG capsule Take 1 capsule (40 mg total) by mouth 2 (two) times daily. To reduce stomach acid 60 capsule 2  . ondansetron (ZOFRAN) 4 MG tablet Take 1 tablet (4 mg total) by mouth every 8 (eight) hours as needed for nausea or vomiting. 30 tablet 0  . polyethylene glycol (MIRALAX) 17 g packet Take 17 g by mouth daily. 14 each 0  . zinc gluconate 50 MG tablet Take 1 tablet (50 mg total) by mouth daily. 30 tablet 0   No facility-administered medications prior to visit.      Review of Systems  Constitutional: Positive for fatigue.  HENT: Negative.  Negative for nosebleeds, postnasal drip, rhinorrhea, sinus pressure, sinus pain, sneezing, sore throat, tinnitus and trouble swallowing.   Eyes: Negative.   Respiratory: Positive for cough. Negative for chest tightness, shortness of breath and wheezing.   Cardiovascular: Negative for chest pain.  Gastrointestinal: Positive for nausea. Negative for abdominal pain, anal bleeding, blood in stool, constipation, diarrhea, rectal pain and vomiting.  Endocrine: Negative.   Genitourinary: Negative for flank pain.  Musculoskeletal: Negative for back pain.  Skin: Negative.   Neurological: Negative.   Hematological: Negative.   Psychiatric/Behavioral: Negative.        Objective:   Physical Exam There were no vitals filed for this visit.  Gen: Pleasant, well-nourished, in no distress,  normal affect  ENT: No lesions,  mouth clear,  oropharynx clear, no postnasal drip  Neck: No JVD, no TMG, no carotid bruits  Lungs: No use of accessory muscles, no dullness to percussion, clear without rales or rhonchi  Cardiovascular: RRR, heart sounds normal, no murmur or gallops, no peripheral edema  Abdomen: soft and NT, no  HSM,  BS normal  Musculoskeletal: No deformities, no cyanosis or clubbing  Neuro: alert, non focal  Skin: Warm, no lesions or rashes   All labs reviewed  DESCRIPTION OF PROCEDURE:  The patient was brought to the operating room on 10/02/2019.  He had induction of general anesthesia and was intubated.  A timeout was performed.  Flexible fiberoptic bronchoscopy  was performed via the endotracheal tube.  It  revealed normal endobronchial anatomy with no endobronchial lesions to the level of the subsegmental bronchi.  There were some thick, clear secretions in the superior segmental bronchus.  Planning for the procedure had been done on the computer preoperatively.  The locatable guide for navigation was placed and registration was performed.  The bronchoscope then was directed to the left lower lobe bronchus and the locatable guide was advanced into the superior segmental bronchus, into the  appropriate subsegmental bronchus and then to within a centimeter of the center of the lesion with good alignment.  Position was confirmed with fluoroscopy.  Sampling then was performed.  All sampling was performed with fluoroscopy.  Needle aspirations  were performed.  The first 2 were placed on the slides.  The third aspiration was placed only in the cell block.  Next, 3 needle brushings were performed and then multiple biopsies were obtained.  Biopsies were sent for both AFB and fungal cultures, as  well as for permanent pathology.  The quick preps on the needle aspirations and brushings showed acute inflammation, no tumor cells were seen, nor were there any definite granulomas seen.  There was some mild bleeding with the biopsies.  After obtaining  sufficient biopsies for both pathology and cultures,  bronchoalveolar lavage was performed. 100 mL of saline was instilled and 10 mL was obtained with aspiration.  This was sent for AFB and fungal cultures as well.  A final inspection was made with the  bronchoscope  and there was no ongoing bleeding.  The total fluoroscopy time was 4.5 minutes with a total dose of 42 milligray.  The patient then was extubated in the operating room and taken to the [facility] in good condition.   LEFT LOWER LOBE SPECIMEN C  Performed at [facility], [place] [address], [place], [place] [address]   Culture RARE FUNGUS (MOLD) ISOLATED, PROBABLE CONTAMINANT/COLONIZER (SAPROPHYTE). CONTACT MICROBIOLOGY IF FURTHER IDENTIFICATION REQUIRED [phone].    7/22 FINAL MICROSCOPIC DIAGNOSIS:   A. LUNG, LEFT LOWER LOBE, BIOPSY:  - Aspergilloma, see comment   B. LUNG, LEFT LOWER LOBE, BIOPSY:  - Aspergilloma, see comment    COMMENT:   A and B. GMS stain is confirmatory    DATE OF PROCEDURE:  11/14/2019  PREOPERATIVE DIAGNOSIS:  Aspergilloma left lower lobe with hemoptysis.  POSTOPERATIVE DIAGNOSIS:  Aspergilloma left lower lobe with hemoptysis.  PROCEDURE:   Xi robotic video-assisted left thoracoscopy Lysis of adhesions, Left lower lobectomy, Lymph node dissection, and Intercostal nerve blocks levels 3 through 10.  SURGEON:  Modesto Charon, MD  ASSISTANT:  Nicholes Rough, PA-C  ANESTHESIA:  General.  FINDINGS:  Severe adhesions throughout the pleural space.  Mass clearly visible in superior segment.       Assessment & Plan:  I personally reviewed all images and lab data in the [facility] system as well as any outside material available during this office visit and agree with the  radiology impressions.   No problem-specific Assessment & Plan notes found for this encounter.   There are no diagnoses linked to this encounter.

## 2020-04-06 MED FILL — $BREO ELLIPTA 200-25 MCG IN: 200-25 | 30 days supply | Qty: 60 | Fill #3

## 2020-04-06 MED FILL — GABAPENTIN 300 MG CAPSULE: 300 | 12 days supply | Qty: 12 | Fill #1

## 2020-04-21 ENCOUNTER — Ambulatory Visit: Payer: Self-pay | Admitting: *Deleted

## 2020-04-21 ENCOUNTER — Telehealth: Payer: Self-pay | Admitting: Critical Care Medicine

## 2020-04-21 NOTE — Telephone Encounter (Signed)
Jay'a  When [facility] sends a msg like below: and you forward, the chart is closed and I cannot add on the msg,    This is a new encounter I have opened:  Pt will need an OV.  See if another provider can work him in , eg Cari Mayers ?tomorrow?  Thanks    Will forward to provider       Cleotilde Neer, RN to [facility]      04/21/20 12:18 PM FYI- please contact patient if earlier appt available with PCP and if patient should restart miralax prior to next appt. Due to c/o persistent diarrhea    Cleotilde Neer, RN     04/21/20 12:18 PM Note   Patient called with assistance from interpreter Renaldo Fiddler #[phone]. Patient and interpreter disconnected x 2. Most of information gathered with interpreter assistance. Patient was able to call back on last disconnection and communicated effectively. C/o ongoing diarrhea since having covid in March 17, 2020. C/o continued diarrhea with abdominal pain and burning every am around 5 am. Reports after am issues he feels better during the day. C/o  nausea in am and sour taste in his throat . Reports he has been taking his prescribed omeperzole and zofran. Denies fever, cough, SOB, difficulty breathing. Patient reports he is still taking miralax. Instructed patient to wait on taking the miralax daily until confirmation from PCP. Patient only tolerating small portions of food at a time. Reports he is taking in bland foods, such as oatmeal. Reports he is able to stay hydrated. appt scheduled for 05/04/20 for these c/o. Care advise given. Patient verbalized understanding of care advise and to call back or go to ED if symptoms worsen. Please clarify if patient is to restart his miralax prior to appt.

## 2020-04-21 NOTE — Telephone Encounter (Signed)
Patient called with assistance from interpreter Renaldo Fiddler [phone]. Patient and interpreter disconnected x 2. Most of information gathered with interpreter assistance. Patient was able to call back on last disconnection and communicated effectively. C/o ongoing diarrhea since having covid in March 17, 2020. C/o continued diarrhea with abdominal pain and burning every am around 5 am. Reports after am issues he feels better during the day. C/o  nausea in am and sour taste in his throat . Reports he has been taking his prescribed omeperzole and zofran. Denies fever, cough, SOB, difficulty breathing. Patient reports he is still taking miralax. Instructed patient to wait on taking the miralax daily until confirmation from PCP. Patient only tolerating small portions of food at a time. Reports he is taking in bland foods, such as oatmeal. Reports he is able to stay hydrated. appt scheduled for 05/04/20 for these c/o. Care advise given. Patient verbalized understanding of care advise and to call back or go to ED if symptoms worsen. Please clarify if patient is to restart his miralax prior to appt.   Reason for Disposition . [1] PERSISTING SYMPTOMS OF COVID-19 AND [2] symptoms SAME AND [3] medical visit for COVID-19 in past 2 weeks  Answer Assessment - Initial Assessment Questions 1. COVID-19 ONSET: "When did the symptoms of COVID-19 first start?"     03/17/20 2. DIAGNOSIS CONFIRMATION: "How were you diagnosed?" (e.g., COVID-19 oral or nasal viral test; COVID-19 antibody test; doctor visit)     na 3. MAIN SYMPTOM:  "What is your main concern or symptom right now?" (e.g., breathing difficulty, cough, fatigue. loss of smell)     Continued c/o symptoms since having covid in [place] 4. SYMPTOM ONSET: "When did the  symptoms  start?"     Since Jan. 5. BETTER-SAME-WORSE: "Are you getting better, staying the same, or getting worse over the last 1 to 2 weeks?"     Same or in the early morning hours worse 6. RECENT MEDICAL  VISIT: "Have you been seen by a healthcare provider (doctor, NP, PA) for these persisting COVID-19 symptoms?" If Yes, ask: "When were you seen?" (e.g., date)     Yes . Was given antibiotic per patient  And omeprazole for reflux zofran for nauses 7. COUGH: "Do you have a cough?" If Yes, ask: "How bad is the cough?"       na 8. FEVER: "Do you have a fever?" If Yes, ask: "What is your temperature, how was it measured, and when did it start?"     na 9. BREATHING DIFFICULTY: "Are you having any trouble breathing?" If Yes, ask: "How bad is your breathing?" (e.g., mild, moderate, severe)    - MILD: No SOB at rest, mild SOB with walking, speaks normally in sentences, can lie down, no retractions, pulse < 100.    - MODERATE: SOB at rest, SOB with minimal exertion and prefers to sit, cannot lie down flat, speaks in phrases, mild retractions, audible wheezing, pulse 100-120.    - SEVERE: Very SOB at rest, speaks in single words, struggling to breathe, sitting hunched forward, retractions, pulse > 120       na 10. HIGH RISK DISEASE: "Do you have any chronic medical problems?" (e.g., asthma, heart or lung disease, weak immune system, obesity, etc.)       na 11. VACCINE: "Have you gotten the COVID-19 vaccine?" If Yes ask: "Which one, how many shots, when did you get it?"       na 12. PREGNANCY: "Is there any chance you  are pregnant?" "When was your last menstrual period?"       na 13. OTHER SYMPTOMS: "Do you have any other symptoms?"  (e.g., fatigue, headache, muscle pain, weakness)       Diarrhea , abdominal pain in am around 5 am every morning with burning and sour taste in his mouth and throat.  Protocols used: CORONAVIRUS (COVID-19) PERSISTING SYMPTOMS FOLLOW-UP CALL-A-AH

## 2020-04-21 NOTE — Telephone Encounter (Signed)
Will forward to provider  

## 2020-04-22 NOTE — Telephone Encounter (Signed)
Could you contact pt and schedule  

## 2020-04-23 NOTE — Telephone Encounter (Signed)
Called patient and LVM using interpreter services to call [phone] to schedule with mobile medicine unit or to give [place] a call back at [phone].

## 2020-04-26 ENCOUNTER — Encounter: Payer: Self-pay | Admitting: Physician Assistant

## 2020-04-26 ENCOUNTER — Ambulatory Visit (INDEPENDENT_AMBULATORY_CARE_PROVIDER_SITE_OTHER): Payer: Self-pay

## 2020-04-26 ENCOUNTER — Other Ambulatory Visit: Payer: Self-pay

## 2020-04-26 ENCOUNTER — Ambulatory Visit (INDEPENDENT_AMBULATORY_CARE_PROVIDER_SITE_OTHER): Payer: Self-pay | Admitting: Physician Assistant

## 2020-04-26 ENCOUNTER — Other Ambulatory Visit: Payer: Self-pay | Admitting: Physician Assistant

## 2020-04-26 VITALS — BP 134/91 | HR 92 | Temp 98.3°F | Resp 18 | Ht 68.0 in | Wt 210.0 lb

## 2020-04-26 DIAGNOSIS — R110 Nausea: Secondary | ICD-10-CM

## 2020-04-26 DIAGNOSIS — R1084 Generalized abdominal pain: Secondary | ICD-10-CM

## 2020-04-26 DIAGNOSIS — K219 Gastro-esophageal reflux disease without esophagitis: Secondary | ICD-10-CM

## 2020-04-26 DIAGNOSIS — A048 Other specified bacterial intestinal infections: Secondary | ICD-10-CM

## 2020-04-26 DIAGNOSIS — R030 Elevated blood-pressure reading, without diagnosis of hypertension: Secondary | ICD-10-CM

## 2020-04-26 MED ORDER — SUCRALFATE 1 G PO TABS: 1.0000 g | ORAL_TABLET | Freq: Three times a day (TID) | ORAL | 0 refills | Status: DC

## 2020-04-26 MED FILL — SUCRALFATE 1 GM TABLET: 1 | 30 days supply | Qty: 120 | Fill #0

## 2020-04-26 NOTE — Patient Instructions (Signed)
You will continue taking the zofran and prilosec.  I would like you to start carafate as directed.    We will call you with your lab and xray results   I hope that you feel better soon  Roney Jaffe, PA-C Physician Assistant [facility] [email or web address]    Dolor abdominal en los adultos Abdominal Pain, Adult El dolor en el abdomen (dolor abdominal) puede tener muchas causas. A menudo, el dolor abdominal no es grave y [place] sin tratamiento o con tratamiento en la casa. Sin embargo, a Facilities manager abdominal es grave. El mdico le har preguntas sobre sus antecedentes mdicos y le har un examen fsico para tratar de Production assistant, radio causa del dolor abdominal. Siga estas instrucciones en su casa: Medicamentos  Use los medicamentos de venta libre y los recetados solamente como se lo haya indicado el mdico.  No tome un laxante a menos que se lo haya indicado el mdico. Instrucciones generales  Controle su afeccin para detectar cualquier cambio.  Beba suficiente lquido como para Pharmacologist la orina de color amarillo plido.  Concurra a todas las visitas de [address] se lo haya indicado el mdico. Esto es importante.   Comunquese con un mdico si:  El dolor abdominal cambia o empeora.  No tiene apetito o baja de peso sin proponrselo.  Est estreido o tiene diarrea durante ms de 2 o 3das.  Tiene dolor cuando orina o defeca.  El dolor abdominal lo despierta de noche.  El dolor empeora con las comidas, despus de comer o con determinados alimentos.  Tiene vmitos y no puede retener nada de lo que ingiere.  Tiene fiebre.  Observa sangre en la orina. Solicite ayuda inmediatamente si:  El dolor no desaparece tan pronto como el mdico le dijo que era esperable.  No puede dejar de vomitar.  El Engineer, mining se siente solo en zonas del abdomen, como el lado derecho o la parte inferior izquierda del abdomen. Si se  localiza en la zona derecha, posiblemente podra tratarse de apendicitis.  Las heces son sanguinolentas o de color negro, o de aspecto alquitranado.  Tiene dolor intenso, clicos o distensin abdominal.  Tiene signos de deshidratacin, como los siguientes: ? Orina de color oscuro, muy escasa o falta de orina. ? Labios agrietados. ? Sequedad de boca. ? Ojos hundidos. ? Somnolencia. ? Debilidad.  Tiene dificultad para respirar o Journalist, newspaper. Resumen  A menudo, el dolor abdominal no es grave y [place] sin tratamiento o con tratamiento en la casa. Sin embargo, a Facilities manager abdominal es grave.  Controle su afeccin para Insurance risk surveyor cambio.  Use los medicamentos de venta libre y los recetados solamente como se lo haya indicado el mdico.  Comunquese con un mdico si el dolor abdominal cambia o [place].  Busque ayuda de inmediato si tiene dolor intenso, clicos o distensin abdominal. Esta informacin no tiene Theme park manager el consejo del mdico. Asegrese de hacerle al mdico cualquier pregunta que tenga. Document Revised: 09/04/2018 Document Reviewed: 09/04/2018 Elsevier Patient Education  2021 [employer].

## 2020-04-26 NOTE — Progress Notes (Signed)
Patient has taken medication and patient has eaten today. Patient complains of diarrhea a month ago while battling COVID. Patient shares even after testing negative patient was still experiencing diarrhea. Patient shares he received Flagyl and Cipro and now has not had a regular BM in two weeks.

## 2020-04-26 NOTE — Progress Notes (Signed)
Established Patient Office Visit  Subjective:  Patient ID: Eduardo Moore, male    DOB: 09-30-1980  Age: 40 y.o. MRN: [id number]  CC:  Chief Complaint  Patient presents with  . Constipation    HPI Eduardo Moore reports that he was seen in [place] after being diagnosed with COVID due to having multiple episodes of diarrhea.  Reports that he was given a prescription of Flagyl and Cipro which he did complete.  Reports that for the past 2 weeks his bowels have been hard to pass.  Reports that he has several episodes throughout the day where he will have loud bowel noises, feel hot, then feel nauseous, then have a bowel movement.  Reports that he will feel hungry afterwards but only eat a small amount because if he eats more he feels the nausea returning which will be followed by a burning sensation in his chest.  Reports that he has been taking omeprazole and Zofran without relief.  Reports he has been having nighttime awakenings with the same complaint.  Reports that he will feel abdominal pain for the majority of the time even when he is not having those episodes.  Reports he has the pain mostly in his left lower quadrant.  Reports he finds these symptoms very disturbing, states it is making it hard for him to sleep, he is very worried about what is wrong.  Due to language barrier, an interpreter was present during the history-taking and subsequent discussion (and for part of the physical exam) with this patient.      Past Medical History:  Diagnosis Date  . Anxiety   . Depression   . Dyspnea    when walking  . GERD (gastroesophageal reflux disease)     Past Surgical History:  Procedure Laterality Date  . INTERCOSTAL NERVE BLOCK Left 11/14/2019   Procedure: INTERCOSTAL NERVE BLOCK;  Surgeon: Loreli Slot, MD;  Location: [facility] OR;  Service: Thoracic;  Laterality: Left;  . LYMPH NODE DISSECTION N/A 11/14/2019   Procedure: LYMPH NODE DISSECTION;  Surgeon:  Loreli Slot, MD;  Location[facility] OR;  Service: Thoracic;  Laterality: N/A;  . VIDEO BRONCHOSCOPY WITH ENDOBRONCHIAL NAVIGATION N/A 10/02/2019   Procedure: VIDEO BRONCHOSCOPY WITH ENDOBRONCHIAL NAVIGATION;  Surgeon: Loreli Slot, MD;  Location: [facility] OR;  Service: Thoracic;  Laterality: N/A;    Family History  Problem Relation Age of Onset  . Asthma Mother     Social History   Socioeconomic History  . Marital status: Single    Spouse name: Not on file  . Number of children: Not on file  . Years of education: Not on file  . Highest education level: Not on file  Occupational History  . Not on file  Tobacco Use  . Smoking status: Never Smoker  . Smokeless tobacco: Never Used  Vaping Use  . Vaping Use: Never used  Substance and Sexual Activity  . Alcohol use: Yes    Comment: occasional  . Drug use: Never  . Sexual activity: Not on file  Other Topics Concern  . Not on file  Social History Narrative  . Not on file   Social Determinants of Health   Financial Resource Strain: Not on file  Food Insecurity: Not on file  Transportation Needs: Not on file  Physical Activity: Not on file  Stress: Not on file  Social Connections: Not on file  Intimate Partner Violence: Not on file    Outpatient Medications Prior to Visit  Medication  Sig Dispense Refill  . acetaminophen (TYLENOL) 500 MG tablet Take 500 mg by mouth every 6 (six) hours as needed for mild pain.    Marland Kitchen. albuterol (PROVENTIL) (2.5 MG/3ML) 0.083% nebulizer solution Take 2.5 mg by nebulization 2 (two) times daily as needed for wheezing or shortness of breath.    Marland Kitchen. albuterol (VENTOLIN HFA) 108 (90 Base) MCG/ACT inhaler Inhale 2 puffs into the lungs every 6 (six) hours as needed for wheezing or shortness of breath. 18 g 1  . ALPRAZolam (XANAX) 0.25 MG tablet Take 1 tablet (0.25 mg total) by mouth 2 (two) times daily as needed for anxiety. 30 tablet 0  . Ascorbic Acid (VITAMIN C) 500 MG CAPS One daily 30 capsule 0   . Cholecalciferol (VITAMIN D) 125 MCG (5000 UT) CAPS One daily 30 capsule 0  . fluticasone furoate-vilanterol (BREO ELLIPTA) 200-25 MCG/INH AEPB Inhale 1 puff into the lungs daily. 60 each 11  . gabapentin (NEURONTIN) 300 MG capsule TAKE 1 CAPSULE (300 MG TOTAL) BY MOUTH DAILY. 42 capsule 0  . omeprazole (PRILOSEC) 40 MG capsule Take 1 capsule (40 mg total) by mouth 2 (two) times daily. To reduce stomach acid 60 capsule 2  . ondansetron (ZOFRAN) 4 MG tablet Take 1 tablet (4 mg total) by mouth every 8 (eight) hours as needed for nausea or vomiting. 30 tablet 0  . polyethylene glycol (MIRALAX) 17 g packet Take 17 g by mouth daily. 14 each 0  . zinc gluconate 50 MG tablet Take 1 tablet (50 mg total) by mouth daily. 30 tablet 0   No facility-administered medications prior to visit.    Allergies  Allergen Reactions  . Lentil Shortness Of Breath and Rash    lentils  . Pea Shortness Of Breath and Rash    Green Peas    ROS Review of Systems  Constitutional: Negative for chills and fever.  HENT: Negative.   Eyes: Negative.   Respiratory: Negative.   Cardiovascular: Negative for chest pain.  Gastrointestinal: Positive for abdominal pain, constipation and nausea. Negative for blood in stool, diarrhea and vomiting.  Endocrine: Negative.   Genitourinary: Negative.   Musculoskeletal: Negative.   Skin: Negative.   Allergic/Immunologic: Negative.   Neurological: Negative.   Hematological: Negative.   Psychiatric/Behavioral: Negative.       Objective:    Physical Exam Vitals and nursing note reviewed.  Constitutional:      Appearance: Normal appearance.  HENT:     Head: Normocephalic and atraumatic.     Right Ear: External ear normal.     Left Ear: External ear normal.     Mouth/Throat:     Mouth: Mucous membranes are moist.     Pharynx: Oropharynx is clear.  Eyes:     Conjunctiva/sclera: Conjunctivae normal.     Pupils: Pupils are equal, round, and reactive to light.   Cardiovascular:     Rate and Rhythm: Normal rate and regular rhythm.     Pulses: Normal pulses.     Heart sounds: Normal heart sounds.  Pulmonary:     Effort: Pulmonary effort is normal.     Breath sounds: Normal breath sounds.  Abdominal:     General: Abdomen is flat. Bowel sounds are increased.     Palpations: Abdomen is soft.     Tenderness: There is abdominal tenderness in the epigastric area and left lower quadrant. There is no guarding. Negative signs include Murphy's sign.     Comments: Audible bowel sounds   Musculoskeletal:  General: Normal range of motion.     Cervical back: Normal range of motion and neck supple.  Skin:    General: Skin is warm and dry.  Neurological:     General: No focal deficit present.     Mental Status: He is alert and oriented to person, place, and time.  Psychiatric:        Mood and Affect: Mood normal.        Behavior: Behavior normal.        Thought Content: Thought content normal.        Judgment: Judgment normal.     BP (!) 134/91 (BP Location: Right Arm, Patient Position: Sitting, Cuff Size: Large)   Pulse 92   Temp 98.3 F (36.8 C) (Oral)   Resp 18   Ht 5\' 8"  (1.727 m)   Wt 210 lb (95.3 kg)   SpO2 97%   BMI 31.93 kg/m  Wt Readings from Last 3 Encounters:  04/26/20 210 lb (95.3 kg)  02/10/20 206 lb 6.4 oz (93.6 kg)  01/06/20 201 lb 12.8 oz (91.5 kg)     Health Maintenance Due  Topic Date Due  . COVID-19 Vaccine (3 - Booster for Pfizer series) 01/29/2020    There are no preventive care reminders to display for this patient.  No results found for: TSH Lab Results  Component Value Date   WBC 8.7 12/05/2019   HGB 14.0 12/05/2019   HCT 40.1 12/05/2019   MCV 87 12/05/2019   PLT 312 12/05/2019   Lab Results  Component Value Date   NA 139 12/05/2019   K 4.2 12/05/2019   CO2 27 12/05/2019   GLUCOSE 86 12/05/2019   BUN 9 12/05/2019   CREATININE 0.81 12/05/2019   BILITOT 0.6 12/05/2019   ALKPHOS 118 12/05/2019    AST 14 12/05/2019   ALT 43 12/05/2019   PROT 7.2 12/05/2019   ALBUMIN 4.6 12/05/2019   CALCIUM 10.1 12/05/2019   ANIONGAP 9 11/20/2019   No results found for: CHOL No results found for: HDL No results found for: LDLCALC No results found for: TRIG No results found for: CHOLHDL No results found for: 01/20/2020    Assessment & Plan:   Problem List Items Addressed This Visit   None   Visit Diagnoses    Generalized abdominal pain    -  Primary   Relevant Medications   sucralfate (CARAFATE) 1 g tablet   Other Relevant Orders   CBC with Differential/Platelet   Comp. Metabolic Panel (12)   H Pylori, IGM, IGG, IGA AB   DG Abd 2 Views (Completed)   Nausea without vomiting       Gastroesophageal reflux disease without esophagitis       Relevant Medications   sucralfate (CARAFATE) 1 g tablet   Elevated blood pressure reading in office without diagnosis of hypertension         1. Generalized abdominal pain Continue current regimen, trial carafate, increase water intake  Consider GI referral - CBC with Differential/Platelet - Comp. Metabolic Panel (12) - H Pylori, IGM, IGG, IGA AB - DG Abd 2 Views - sucralfate (CARAFATE) 1 g tablet; Take 1 tablet (1 g total) by mouth 4 (four) times daily -  with meals and at bedtime.  Dispense: 120 tablet; Refill: 0  2. Nausea without vomiting   3. Gastroesophageal reflux disease without esophagitis   4. Elevated blood pressure reading in office without diagnosis of hypertension   Meds ordered this encounter  Medications  .  sucralfate (CARAFATE) 1 g tablet    Sig: Take 1 tablet (1 g total) by mouth 4 (four) times daily -  with meals and at bedtime.    Dispense:  120 tablet    Refill:  0    Order Specific Question:   Supervising Provider    Answer:   Storm Frisk [[id number]]    I have reviewed the patient's medical history (PMH, PSH, Social History, Family History, Medications, and allergies) , and have been updated if relevant. I  spent 30 minutes reviewing chart and  face to face time with patient.    Follow-up: Return if symptoms worsen or fail to improve.    Kasandra Knudsen Mayers, PA-C

## 2020-04-27 NOTE — Addendum Note (Signed)
Addended by: Roney Jaffe on: 04/27/2020 03:43 PM   Modules accepted: Orders

## 2020-04-28 ENCOUNTER — Other Ambulatory Visit: Payer: Self-pay | Admitting: Physician Assistant

## 2020-04-28 DIAGNOSIS — A048 Other specified bacterial intestinal infections: Secondary | ICD-10-CM | POA: Insufficient documentation

## 2020-04-28 DIAGNOSIS — R1084 Generalized abdominal pain: Secondary | ICD-10-CM

## 2020-04-28 DIAGNOSIS — R030 Elevated blood-pressure reading, without diagnosis of hypertension: Secondary | ICD-10-CM | POA: Insufficient documentation

## 2020-04-28 DIAGNOSIS — R110 Nausea: Secondary | ICD-10-CM | POA: Insufficient documentation

## 2020-04-28 DIAGNOSIS — K219 Gastro-esophageal reflux disease without esophagitis: Secondary | ICD-10-CM | POA: Insufficient documentation

## 2020-04-28 HISTORY — DX: Generalized abdominal pain: R10.84

## 2020-04-28 LAB — COMP. METABOLIC PANEL (12)
AST: 38 IU/L (ref 0–40)
Albumin/Globulin Ratio: 1.5 (ref 1.2–2.2)
Albumin: 4.8 g/dL (ref 4.0–5.0)
Alkaline Phosphatase: 104 IU/L (ref 44–121)
BUN/Creatinine Ratio: 10 (ref 9–20)
BUN: 9 mg/dL (ref 6–24)
Bilirubin Total: 1.1 mg/dL (ref 0.0–1.2)
Calcium: 9.8 mg/dL (ref 8.7–10.2)
Chloride: 101 mmol/L (ref 96–106)
Creatinine, Ser: 0.89 mg/dL (ref 0.76–1.27)
GFR calc Af Amer: 123 mL/min/{1.73_m2} (ref >59)
GFR calc non Af Amer: 106 mL/min/{1.73_m2} (ref >59)
Globulin, Total: 3.1 g/dL (ref 1.5–4.5)
Glucose: 94 mg/dL (ref 65–99)
Potassium: 4.5 mmol/L (ref 3.5–5.2)
Sodium: 140 mmol/L (ref 134–144)
Total Protein: 7.9 g/dL (ref 6.0–8.5)

## 2020-04-28 LAB — H PYLORI, IGM, IGG, IGA AB
H pylori, IgM Abs: 11.9 units — ABNORMAL HIGH (ref 0.0–8.9)
H. pylori, IgA Abs: 14.4 units — ABNORMAL HIGH (ref 0.0–8.9)
H. pylori, IgG AbS: 3.74 Index Value — ABNORMAL HIGH (ref 0.00–0.79)

## 2020-04-28 LAB — CBC WITH DIFFERENTIAL/PLATELET
Basophils Absolute: 0 10*3/uL (ref 0.0–0.2)
Basos: 1 %
EOS (ABSOLUTE): 0.1 10*3/uL (ref 0.0–0.4)
Eos: 2 %
Hematocrit: 44.8 % (ref 37.5–51.0)
Hemoglobin: 15.4 g/dL (ref 13.0–17.7)
Immature Grans (Abs): 0.1 10*3/uL (ref 0.0–0.1)
Immature Granulocytes: 1 %
Lymphocytes Absolute: 2.3 10*3/uL (ref 0.7–3.1)
Lymphs: 26 %
MCH: 29.8 pg (ref 26.6–33.0)
MCHC: 34.4 g/dL (ref 31.5–35.7)
MCV: 87 fL (ref 79–97)
Monocytes Absolute: 0.6 10*3/uL (ref 0.1–0.9)
Monocytes: 7 %
Neutrophils Absolute: 5.7 10*3/uL (ref 1.4–7.0)
Neutrophils: 63 %
Platelets: 189 10*3/uL (ref 150–450)
RBC: 5.16 x10E6/uL (ref 4.14–5.80)
RDW: 14.3 % (ref 11.6–15.4)
WBC: 8.9 10*3/uL (ref 3.4–10.8)

## 2020-04-28 MED ORDER — AMOXICILLIN 500 MG PO CAPS: 1000.0000 mg | ORAL_CAPSULE | Freq: Two times a day (BID) | ORAL | 0 refills | Status: DC

## 2020-04-28 MED ORDER — CLARITHROMYCIN 500 MG PO TABS: 500.0000 mg | ORAL_TABLET | Freq: Two times a day (BID) | ORAL | 0 refills | Status: DC

## 2020-04-28 MED ORDER — PANTOPRAZOLE SODIUM 40 MG PO TBEC: 40.0000 mg | DELAYED_RELEASE_TABLET | Freq: Every day | ORAL | 3 refills | Status: DC

## 2020-04-28 MED FILL — AMOXICILLIN 500 MG CAPSULE: 500 | 10 days supply | Qty: 40 | Fill #0

## 2020-04-28 MED FILL — CLARITHROMYCIN 500 MG TAB: 500 | 10 days supply | Qty: 20 | Fill #0

## 2020-04-28 MED FILL — PANTOPRAZOLE SOD DR 40 MG T: 40 | 30 days supply | Qty: 30 | Fill #0

## 2020-04-28 NOTE — Addendum Note (Signed)
Addended by: Roney Jaffe on: 04/28/2020 08:50 AM   Modules accepted: Orders

## 2020-04-29 ENCOUNTER — Telehealth: Payer: Self-pay | Admitting: Critical Care Medicine

## 2020-04-29 NOTE — Telephone Encounter (Signed)
Pt called to know results from past appt w/Dr. Tomi Bamberger. Please advise and thank you

## 2020-04-29 NOTE — Telephone Encounter (Signed)
Copied from CRM [id number]. Topic: General - Other >> Apr 29, 2020  2:12 PM Gwenlyn Fudge wrote: Reason for CRM: Pt called stating that he had a message on VM stating that there was an emergency and that he needed to contact office regarding his blood work. Attempted to contact office x2. Please advise.

## 2020-05-04 ENCOUNTER — Encounter: Payer: Self-pay | Admitting: Critical Care Medicine

## 2020-05-04 ENCOUNTER — Ambulatory Visit: Payer: Self-pay | Attending: Critical Care Medicine | Admitting: Critical Care Medicine

## 2020-05-04 ENCOUNTER — Telehealth: Payer: Self-pay | Admitting: *Deleted

## 2020-05-04 ENCOUNTER — Other Ambulatory Visit: Payer: Self-pay

## 2020-05-04 ENCOUNTER — Telehealth: Payer: Self-pay | Admitting: Critical Care Medicine

## 2020-05-04 ENCOUNTER — Other Ambulatory Visit: Payer: Self-pay | Admitting: Critical Care Medicine

## 2020-05-04 VITALS — BP 134/81 | HR 96 | Temp 98.4°F | Resp 18 | Wt 208.4 lb

## 2020-05-04 DIAGNOSIS — F320 Major depressive disorder, single episode, mild: Secondary | ICD-10-CM | POA: Insufficient documentation

## 2020-05-04 DIAGNOSIS — J4540 Moderate persistent asthma, uncomplicated: Secondary | ICD-10-CM

## 2020-05-04 DIAGNOSIS — R110 Nausea: Secondary | ICD-10-CM

## 2020-05-04 DIAGNOSIS — A048 Other specified bacterial intestinal infections: Secondary | ICD-10-CM

## 2020-05-04 DIAGNOSIS — R1084 Generalized abdominal pain: Secondary | ICD-10-CM

## 2020-05-04 DIAGNOSIS — K219 Gastro-esophageal reflux disease without esophagitis: Secondary | ICD-10-CM

## 2020-05-04 DIAGNOSIS — F418 Other specified anxiety disorders: Secondary | ICD-10-CM

## 2020-05-04 HISTORY — DX: Major depressive disorder, single episode, mild: F32.0

## 2020-05-04 MED ORDER — ONDANSETRON HCL 4 MG PO TABS: 4.0000 mg | ORAL_TABLET | Freq: Three times a day (TID) | ORAL | 0 refills | Status: DC | PRN

## 2020-05-04 MED ORDER — SUCRALFATE 1 G PO TABS: 1.0000 g | ORAL_TABLET | Freq: Three times a day (TID) | ORAL | 0 refills | Status: DC

## 2020-05-04 MED FILL — ONDANSETRON HCL 4 MG TABLET: 4 | 10 days supply | Qty: 30 | Fill #0

## 2020-05-04 MED FILL — SUCRALFATE 1 GM TABLET: 1 | 30 days supply | Qty: 120 | Fill #0

## 2020-05-04 MED FILL — $BREO ELLIPTA 200-25 MCG IN: 200-25 | 30 days supply | Qty: 60 | Fill #4

## 2020-05-04 NOTE — Assessment & Plan Note (Signed)
Secondary to H. pylori infection and COVID colitis now resolved

## 2020-05-04 NOTE — Progress Notes (Signed)
MA left VM with patients results per DPR. Patient also received results in person during FU visit on today. 

## 2020-05-04 NOTE — Telephone Encounter (Signed)
-----   Message from Roney Jaffe, [place] sent at 04/27/2020  3:43 PM EST ----- Please call patient and let him know that his x-ray did not show any reason for his left lower abdominal pain, it did show that he does have a large amount of stool, I would encourage him to increase his water intake, and use a stool softener or MiraLAX. For further evaluation of abdominal pain, I will order CT of abdomen

## 2020-05-04 NOTE — Assessment & Plan Note (Signed)
Moderate persistent asthma continue Breo therapy

## 2020-05-04 NOTE — Assessment & Plan Note (Signed)
Referral back to clinical social worker for more counseling

## 2020-05-04 NOTE — Progress Notes (Signed)
Subjective:    Patient ID: Eduardo Moore, male    DOB: 02-19-1981, 40 y.o.   MRN: [id number]   History of Present Illness:  11/03/2019 this visit was accomplished with Spanish interpreter [name] This is a 40 year old Latino who presents to the clinic for primary care to establish.  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 [name].  The patient's history dates back to March of this year when after being exposed to ceiling tile that he thought had dust and other collected material that he inhaled precipitated increased breathing difficulties.  Subsequent to that he saw Dr. [name] of pulmonary end of May and found to have a mass in the left lower lobe.  The patient underwent a variety of medical work-ups without a resolved diagnosis.  The patient also has a history of asthma-like symptoms.  He has continued had cough shortness of breath and episodic hemoptysis.  There is been other pulmonary work-ups done including a PET scan in June showing superior segment left lower lobe rounded nodule.  Other work-ups have shown eosinophil count of 300 and normal pulmonary function testing.  The patient subsequently went to Dr. [name] for a another opinion September 16, 2019.  He subsequent underwent biopsy and navigational bronchoscopy which was performed with Dr. [name] July 22.  Results of this did show aspergilloma on surgical biopsy from the biopsy of the bronc and as well from the micro biology studies.  Since that time the patient is pondering whether he should have a left lower lobe resection or not he was seeking other opinions.  Interestingly when I interviewed this patient with the use of a Spanish interpreter it was apparent no one had ever actually explained to him that his specific condition and also had not really shown him his imaging studies before  The patient was to see infectious  disease however that appointment has not been achieved as of yet.  He does have a return visit this week with Dr. [name].  The patient was given a Breo inhaler but he cannot afford it so he is not been taking it.  He does have a as needed albuterol inhaler.  He is still having episodes of hemoptysis.  Patient is also had nausea vomiting and low back pain along with left upper quadrant abdominal pain.  He has been in the emergency room twice for this with negative evaluations.  Again the patient is coming in today to establish for primary care.  Note this patient also is in need of a hepatitis C and HIV assay  12/09/2019  Spanish interpeter [name] from [place] used  Here for acute work in   I saw the patient in our isolation room with full PPE applied given that the possibility this could be a Covid infection.  Note the patient had mycetoma removed previously on September 3. The patient states he has had symptoms for the past 4 days and his family also have been ill.  His children and family members of all been Covid tested and are negative.  He has gone to a  testing site as of yesterday 12/08/2019.  Unfortunately it is taking 2 to 3 days for results to return and his results are still pending for Covid  The patient states he has had chest pressure increased cough productive of beige-yellow mucus weakness fatigue but no fever.  He has had nausea GI complaints with  diarrhea and increased dyspnea.  He denies hemoptysis.  Note on 3 September he underwent a left lower lobe robotic procedure to remove a mycetoma from the left lower lobe and also  resection of necrotic lung.  The patient denies any loss of taste or smell.  Note a chest x-ray had been obtained earlier today and showed no active process and resolution of left apical pneumothorax volume loss expected from left lower lobe resection.  See operative note lower down in this note.  Note the patient has received the Pfizer vaccine  for Covid in April and May of this year.  12/15/2019 [facility] follow up visit: This is a telephone visit This patient is seen in short-term follow-up and states his cough is slightly better but is still bringing up considerable amounts of beige mucus notes increased wheezing.  He states his fever is gone.  He still short of breath with exertion.  He is using all his inhalers.  His Covid test was negative.  He still taking his vitamin supplements.  Still using benzonatate if needed for cough  01/06/2020 This patient is seen in return follow-up for aspergilloma resection and postop tracheobronchitis.  Overall the patient is improved.  His chest wall pain is improved from the incision site.  Patient also has less shortness of breath less cough.  He has occasional nausea but no diarrhea or vomiting.  Overall symptom complex is improving.  He is maintaining his inhaled medications.  He is not yet back to work his Chief Strategy Officer says he will have to wait 3 months before he can return to his work  05/04/2020 This a 40 year old male who had prior history of aspergilloma removed in 2020 one of the left upper chest.  He had a prolonged recovery from this including recurrent tracheobronchitis which resolved.  He then developed COVID infection 1 January of this year and was treated symptomatically.  Since that time he continues to have some wheezing and some pain in the left lower quadrant.  He had a significant colitis with the Covid infection.  He was seen by [facility] and they prescribed antibiotics because he had H. pylori positivity.  He is finishing the course of antibiotics he states his left lower quadrant pain is improved diarrhea is resolved nausea still present abdominal pain markedly better.  Patient still concerned about his breathing and wish to be evaluated.  He still has significant high scores on his PHQ-9 and GAD-7   Past Medical History:  Diagnosis Date  . Anxiety   . Current mild  episode of major depressive disorder without prior episode (HCC) 05/04/2020  . Depression   . Dyspnea    when walking  . Generalized abdominal pain 04/28/2020  . GERD (gastroesophageal reflux disease)      Family History  Problem Relation Age of Onset  . Asthma Mother      Social History   Socioeconomic History  . Marital status: Single    Spouse name: Not on file  . Number of children: Not on file  . Years of education: Not on file  . Highest education level: Not on file  Occupational History  . Not on file  Tobacco Use  . Smoking status: Never Smoker  . Smokeless tobacco: Never Used  Vaping Use  . Vaping Use: Never used  Substance and Sexual Activity  . Alcohol use: Yes    Comment: occasional  . Drug use: Never  . Sexual activity: Not on file  Other Topics  Concern  . Not on file  Social History Narrative  . Not on file   Social Determinants of Health   Financial Resource Strain: Not on file  Food Insecurity: Not on file  Transportation Needs: Not on file  Physical Activity: Not on file  Stress: Not on file  Social Connections: Not on file  Intimate Partner Violence: Not on file     Allergies  Allergen Reactions  . Lentil Shortness Of Breath and Rash    lentils  . Pea Shortness Of Breath and Rash    Green Peas     Outpatient Medications Prior to Visit  Medication Sig Dispense Refill  . acetaminophen (TYLENOL) 500 MG tablet Take 500 mg by mouth every 6 (six) hours as needed for mild pain.    Marland Kitchen albuterol (PROVENTIL) (2.5 MG/3ML) 0.083% nebulizer solution Take 2.5 mg by nebulization 2 (two) times daily as needed for wheezing or shortness of breath.    Marland Kitchen albuterol (VENTOLIN HFA) 108 (90 Base) MCG/ACT inhaler Inhale 2 puffs into the lungs every 6 (six) hours as needed for wheezing or shortness of breath. 18 g 1  . amoxicillin (AMOXIL) 500 MG capsule Take 2 capsules (1,000 mg total) by mouth 2 (two) times daily for 10 days. 40 capsule 0  . clarithromycin  (BIAXIN) 500 MG tablet Take 1 tablet (500 mg total) by mouth 2 (two) times daily for 10 days. 20 tablet 0  . fluticasone furoate-vilanterol (BREO ELLIPTA) 200-25 MCG/INH AEPB Inhale 1 puff into the lungs daily. 60 each 11  . pantoprazole (PROTONIX) 40 MG tablet Take 1 tablet (40 mg total) by mouth daily. 30 tablet 3  . Ascorbic Acid (VITAMIN C) 500 MG CAPS One daily 30 capsule 0  . Cholecalciferol (VITAMIN D) 125 MCG (5000 UT) CAPS One daily 30 capsule 0  . ondansetron (ZOFRAN) 4 MG tablet Take 1 tablet (4 mg total) by mouth every 8 (eight) hours as needed for nausea or vomiting. 30 tablet 0  . polyethylene glycol (MIRALAX) 17 g packet Take 17 g by mouth daily. 14 each 0  . sucralfate (CARAFATE) 1 g tablet Take 1 tablet (1 g total) by mouth 4 (four) times daily -  with meals and at bedtime. 120 tablet 0  . zinc gluconate 50 MG tablet Take 1 tablet (50 mg total) by mouth daily. 30 tablet 0  . ALPRAZolam (XANAX) 0.25 MG tablet Take 1 tablet (0.25 mg total) by mouth 2 (two) times daily as needed for anxiety. (Patient not taking: Reported on 05/04/2020) 30 tablet 0  . gabapentin (NEURONTIN) 300 MG capsule TAKE 1 CAPSULE (300 MG TOTAL) BY MOUTH DAILY. 42 capsule 0   No facility-administered medications prior to visit.      Review of Systems  Constitutional: Positive for fatigue.  HENT: Negative.  Negative for nosebleeds, postnasal drip, rhinorrhea, sinus pressure, sinus pain, sneezing, sore throat, tinnitus and trouble swallowing.   Eyes: Negative.   Respiratory: Positive for cough. Negative for chest tightness, shortness of breath and wheezing.   Cardiovascular: Negative for chest pain.  Gastrointestinal: Positive for nausea. Negative for abdominal pain, anal bleeding, blood in stool, constipation, diarrhea, rectal pain and vomiting.  Endocrine: Negative.   Genitourinary: Negative for flank pain.  Musculoskeletal: Negative for back pain.  Skin: Negative.   Neurological: Negative.    Hematological: Negative.   Psychiatric/Behavioral: Negative.        Objective:   Physical Exam Vitals:   05/04/20 1003  BP: 134/81  Pulse: 96  Resp: 18  Temp: 98.4 F (36.9 C)  TempSrc: Oral  SpO2: 97%  Weight: 208 lb 6.4 oz (94.5 kg)    Gen: Pleasant, well-nourished, in no distress,  normal affect  ENT: No lesions,  mouth clear,  oropharynx clear, no postnasal drip  Neck: No JVD, no TMG, no carotid bruits  Lungs: No use of accessory muscles, no dullness to percussion, clear without rales or rhonchi, prominent pseudowheeze that goes away with pursed lip breathing  Cardiovascular: RRR, heart sounds normal, no murmur or gallops, no peripheral edema  Abdomen: soft and NT, no HSM,  BS normal  Musculoskeletal: No deformities, no cyanosis or clubbing  Neuro: alert, non focal  Skin: Warm, no lesions or rashes       Assessment & Plan:  I personally reviewed [facility] as well as any outside material available during this office visit and agree with the  radiology impressions.   H. pylori infection H. pylori infection status post Covid illness with colitis  Symptoms resolving with antibiotic therapy  Refill Carafate for 1 more dosing and continue proton pump inhibitor therapy also refill antiemetic  Generalized abdominal pain Secondary to H. pylori infection and COVID colitis now resolved  Current mild episode of major depressive disorder without prior episode (HCC) Blood pressure normal no indication for medications  Anxiety about health Referral back to clinical social worker for more counseling  Gastroesophageal reflux disease without esophagitis Continue proton pump inhibitor  Asthma, moderate persistent Moderate persistent asthma continue Breo therapy   Dylin was seen today for follow-up.  Diagnoses and all orders for this visit:  Current mild episode of major depressive disorder without prior episode  (HCC)  Nausea -     ondansetron (ZOFRAN) 4 MG tablet; Take 1 tablet (4 mg total) by mouth every 8 (eight) hours as needed for nausea or vomiting.  Generalized abdominal pain -     sucralfate (CARAFATE) 1 g tablet; Take 1 tablet (1 g total) by mouth 4 (four) times daily -  with meals and at bedtime.  H. pylori infection  Anxiety about health  Gastroesophageal reflux disease without esophagitis  Moderate persistent asthma without complication

## 2020-05-04 NOTE — Telephone Encounter (Signed)
Patient returned call regarding his labs results. Please f/u

## 2020-05-04 NOTE — Assessment & Plan Note (Signed)
H. pylori infection status post Covid illness with colitis  Symptoms resolving with antibiotic therapy  Refill Carafate for 1 more dosing and continue proton pump inhibitor therapy also refill antiemetic

## 2020-05-04 NOTE — Telephone Encounter (Signed)
MA left VM with patients results per DPR. Patient also received results in person during FU visit on today.

## 2020-05-04 NOTE — Patient Instructions (Signed)
Refills on the Zofran and pantoprazole was sent to our pharmacy  You have a refill on Breo please stay on this medication  Finish your current antibiotics no refills were given  Return to see Dr. Delford Field in 2 months at which time we will discuss a booster vaccine you can delay this for now for COVID booster  An appointment with Jenel Lucks will be made to discuss further your anxiety symptoms to help you manage  Se enviaron recargas de Zofran y pantoprazol a nuestra farmacia.  Tiene una recarga de [place], por favor contine con este medicamento.  Termine sus antibiticos actuales. No se dieron reabastecimientos.  Vuelva a ver al Dr. Delford Field en 2 meses, momento en el cual hablaremos sobre una vacuna de refuerzo. Puede retrasar esto por ahora para el refuerzo de COVID.  Se programar una cita con Jenel Lucks para analizar ms a fondo sus sntomas de ansiedad y [place] [employer]

## 2020-05-04 NOTE — Telephone Encounter (Signed)
Medical Assistant used [employer] to contact patient.  Interpreter Name: Dalphine Handing #: [id number] Patient was not available, [employer] left patient a voicemail. Patient is aware of lab results via VM and may also view via mychart.

## 2020-05-04 NOTE — Assessment & Plan Note (Signed)
Continue proton pump inhibitor

## 2020-05-04 NOTE — Assessment & Plan Note (Signed)
Blood pressure normal no indication for medications

## 2020-05-10 ENCOUNTER — Other Ambulatory Visit: Payer: Self-pay | Admitting: Thoracic Surgery (Cardiothoracic Vascular Surgery)

## 2020-05-10 DIAGNOSIS — Z9889 Other specified postprocedural states: Secondary | ICD-10-CM

## 2020-05-11 ENCOUNTER — Ambulatory Visit (INDEPENDENT_AMBULATORY_CARE_PROVIDER_SITE_OTHER): Payer: Self-pay | Admitting: Thoracic Surgery (Cardiothoracic Vascular Surgery)

## 2020-05-11 ENCOUNTER — Encounter: Payer: Self-pay | Admitting: Thoracic Surgery (Cardiothoracic Vascular Surgery)

## 2020-05-11 ENCOUNTER — Ambulatory Visit
Admission: RE | Admit: 2020-05-11 | Discharge: 2020-05-11 | Disposition: A | Discharge status: Home / Self Care | Payer: Self-pay | Source: Ambulatory Visit | Attending: Thoracic Surgery (Cardiothoracic Vascular Surgery) | Admitting: Thoracic Surgery (Cardiothoracic Vascular Surgery)

## 2020-05-11 ENCOUNTER — Other Ambulatory Visit: Payer: Self-pay

## 2020-05-11 VITALS — BP 139/90 | HR 96 | Temp 97.6°F | Resp 20 | Ht 68.0 in | Wt 207.0 lb

## 2020-05-11 DIAGNOSIS — Z9889 Other specified postprocedural states: Secondary | ICD-10-CM

## 2020-05-11 DIAGNOSIS — Z09 Encounter for follow-up examination after completed treatment for conditions other than malignant neoplasm: Secondary | ICD-10-CM

## 2020-05-11 NOTE — Progress Notes (Signed)
[address]       Eduardo Moore [address]             [phone]    HPI: [name] returns for a scheduled follow-up after having left lower lobectomy.  He is accompanied by professional interpreter Eduardo Moore.  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  His initial postoperative course was uncomplicated and he went home on day 4.  He had a lot of neuropathic pain in the early postoperative period.  He had significant improvement with gabapentin.  When I last saw him on 02/10/2020 he was taking 300 mg daily.  In the interim since his last visit he had Covid.  He had a moderate respiratory symptoms and back pain and also had more severe abdominal symptoms.  He was treated for H. pylori as well.  Currently he still have some abdominal discomfort mostly right lower quadrant.  He still has some numbness in the left upper quadrant and left costal margin area.  Does experience some discomfort when he bends over.  He is not taking anything for pain.  Past Medical History:  Diagnosis Date  . Anxiety   . Current mild episode of major depressive disorder without prior episode (HCC) 05/04/2020  . Depression   . Dyspnea    when walking  . Generalized abdominal pain 04/28/2020  . GERD (gastroesophageal reflux disease)     Current Outpatient Medications  Medication Sig Dispense Refill  . acetaminophen (TYLENOL) 500 MG tablet Take 500 mg by mouth every 6 (six) hours as needed for mild pain.    Marland Kitchen albuterol (PROVENTIL) (2.5 MG/3ML) 0.083% nebulizer solution Take 2.5 mg by nebulization 2 (two) times daily as needed for wheezing or shortness of breath.    Marland Kitchen albuterol (VENTOLIN HFA) 108 (90 Base) MCG/ACT inhaler Inhale 2 puffs into the lungs every 6 (six) hours as needed for wheezing or shortness of breath. 18 g 1  . fluticasone  furoate-vilanterol (BREO ELLIPTA) 200-25 MCG/INH AEPB Inhale 1 puff into the lungs daily. 60 each 11  . ondansetron (ZOFRAN) 4 MG tablet Take 1 tablet (4 mg total) by mouth every 8 (eight) hours as needed for nausea or vomiting. 30 tablet 0  . pantoprazole (PROTONIX) 40 MG tablet Take 1 tablet (40 mg total) by mouth daily. 30 tablet 3  . sucralfate (CARAFATE) 1 g tablet Take 1 tablet (1 g total) by mouth 4 (four) times daily -  with meals and at bedtime. 120 tablet 0   No current facility-administered medications for this visit.    Physical Exam BP 139/90   Pulse 96   Temp 97.6 F (36.4 C) (Skin)   Resp 20   Ht 5\' 8"  (1.727 m)   Wt 207 lb (93.9 kg)   SpO2 97% Comment: RA  BMI 31.46 kg/m  40 year old man in no acute distress Alert and oriented x3 with no focal deficits Lungs slightly diminished at left base but otherwise clear Incisions well-healed Cardiac regular rate and rhythm  Diagnostic Tests: CHEST - 2 VIEW  COMPARISON:  02/10/2020  FINDINGS: Low volume chest with streaky density behind the heart. No edema, effusion, or pneumothorax. Lung sutures are seen about the left upper mediastinum. Normal heart size and mediastinal contours.  IMPRESSION: Retrocardiac atelectasis.   Electronically Signed   By: 02/12/2020.D.  On: 05/11/2020 09:51 I personally reviewed the chest x-ray images.  Excellent appearance post left lower lobectomy.  Impression: Eduardo Moore is a 40 year old man with a history of anxiety, depression, reflux, aspergilloma, and recent COVID-19 infection.  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  There were multiple other cavitary lesions in the left lower lobe.  From a surgical suspect that he is doing well.  He does still have some numbness in the left upper quadrant.  That may get better with time or may remain unchanged.  He is no longer on gabapentin is not requiring any pain  medication.  There are no restrictions on his activities from my standpoint.  Plan: Follow-up with Dr.Wright as scheduled I will be happy to see Eduardo Moore back anytime in the future if I can be of any further assistance with his care  Eduardo Slot, MD [facility] [phone]

## 2020-05-12 ENCOUNTER — Ambulatory Visit: Payer: Self-pay | Attending: Critical Care Medicine | Admitting: Licensed Clinical Social Worker

## 2020-05-12 DIAGNOSIS — F411 Generalized anxiety disorder: Secondary | ICD-10-CM

## 2020-05-12 NOTE — Progress Notes (Addendum)
Integrated Behavioral Health via Telemedicine Visit  05/12/20 Eduardo Moore [id number]  Number of Integrated Behavioral Health visits: 2 Session Start time: 4:00 PM  Session End time: 4:10 PM Total time: 10  Referring Provider: Dr. Delford Field Patient/Family location: Home [facility] Provider location: Office All persons participating in visit: LCSW and patient Types of Service: Telephone visit  I connected with Eduardo Moore via  Telephone or Engineer, civil (consulting)  (Video is Caregility application) and verified that I am speaking with the correct person using two identifiers. Discussed confidentiality: Yes   I discussed the limitations of telemedicine and the availability of in person appointments.  Discussed there is a possibility of technology failure and discussed alternative modes of communication if that failure occurs.  I discussed that engaging in this telemedicine visit, they consent to the provision of behavioral healthcare and the services will be billed under their insurance.  Patient and/or legal guardian expressed understanding and consented to Telemedicine visit: Yes   Presenting Concerns: Patient and/or family reports the following symptoms/concerns: Follow up call placed to patient. He reported "feeling bad" a couple of weeks ago triggered by abdominal pain and nausea that occurred in the morning resulting in anxiety about health. Duration of problem: Ongoing; Severity of problem: mild  Patient and/or Family's Strengths/Protective Factors: Social connections, Social and Emotional competence, Concrete supports in place (healthy food, safe environments, etc.) and Sense of purpose  Goals Addressed: Patient will: 1.  Demonstrate ability to: Increase healthy adjustment to current life circumstances and Increase adequate support systems for patient/family Pt agreed to apply for Financial Counseling to assist with medical bills  Progress towards  Goals: Ongoing  Interventions: Interventions utilized:  Solution-Focused Strategies Standardized Assessments completed: Not Needed  Patient Response: Pt was engaged during session and agreed to applying for financial assistance to assist with stressors regarding medical bills.   Assessment: Pt shared that he had an appointment with his surgeon yesterday and was medically cleared which made him feel better and lessened his anxiety symptoms. Pt has been taking his prescribed antibiotics which he completed "a couple of days ago"   Pt endorsed continued stress regarding medical bills noting his Medicaid application was recently denied. LCSW discussed with pt that he is eligible to apply for Financial Counseling through [facility], since [place] has denied him services. Pt was appreciative for the information. LCSW received consent to mail CAFA application to address on file. No additional concerns noted.   Plan: 1. Follow up with behavioral health clinician on : Contact LCSW with any additional behavioral health and/or resource needs 2. Behavioral recommendations: Apply for CAFA 3. Referral(s): Integrated [employer] (In Clinic)  I discussed the assessment and treatment plan with the patient and/or parent/guardian. They were provided an opportunity to ask questions and all were answered. They agreed with the plan and demonstrated an understanding of the instructions.   They were advised to call back or seek an in-person evaluation if the symptoms worsen or if the condition fails to improve as anticipated.  Bridgett Larsson, LCSW 05/19/20 1:52 PM

## 2020-05-26 ENCOUNTER — Other Ambulatory Visit: Payer: Self-pay | Admitting: Critical Care Medicine

## 2020-05-26 MED ORDER — PANTOPRAZOLE SODIUM 40 MG PO TBEC: 40.0000 mg | DELAYED_RELEASE_TABLET | Freq: Every day | ORAL | 3 refills | Status: DC

## 2020-05-26 NOTE — Telephone Encounter (Signed)
I refilled protonix   I do not fully understand the part about carafate

## 2020-05-26 NOTE — Addendum Note (Signed)
Addended by: Storm Frisk on: 05/26/2020 07:37 PM   Modules accepted: Orders

## 2020-06-14 ENCOUNTER — Other Ambulatory Visit: Payer: Self-pay

## 2020-06-14 MED ORDER — BREO ELLIPTA 200-25 MCG/INH IN AEPB
1.0000 | INHALATION_SPRAY | RESPIRATORY_TRACT | 11 refills | Status: AC
  Filled 2020-06-14: qty 60, 30d supply, fill #0
  Filled 2020-07-19: qty 60, 30d supply, fill #1
  Filled 2020-08-31: qty 60, 30d supply, fill #2

## 2020-06-16 ENCOUNTER — Other Ambulatory Visit: Payer: Self-pay

## 2020-07-16 ENCOUNTER — Other Ambulatory Visit: Payer: Self-pay

## 2020-07-19 ENCOUNTER — Other Ambulatory Visit: Payer: Self-pay

## 2020-07-19 MED FILL — Pantoprazole Sodium EC Tab 40 MG (Base Equiv): ORAL | 30 days supply | Qty: 30 | Fill #0 | Status: AC

## 2020-08-02 ENCOUNTER — Other Ambulatory Visit: Payer: Self-pay

## 2020-08-24 ENCOUNTER — Other Ambulatory Visit: Payer: Self-pay

## 2020-08-31 ENCOUNTER — Ambulatory Visit: Payer: Self-pay | Admitting: Internal Medicine

## 2020-08-31 ENCOUNTER — Other Ambulatory Visit: Payer: Self-pay

## 2020-08-31 MED FILL — Pantoprazole Sodium EC Tab 40 MG (Base Equiv): ORAL | 30 days supply | Qty: 30 | Fill #1 | Status: AC

## 2020-09-03 ENCOUNTER — Other Ambulatory Visit: Payer: Self-pay

## 2020-09-03 ENCOUNTER — Ambulatory Visit: Payer: Self-pay | Attending: Critical Care Medicine

## 2020-09-24 ENCOUNTER — Other Ambulatory Visit: Payer: Self-pay

## 2020-10-04 ENCOUNTER — Other Ambulatory Visit: Payer: Self-pay

## 2020-10-08 ENCOUNTER — Other Ambulatory Visit: Payer: Self-pay

## 2020-10-22 ENCOUNTER — Ambulatory Visit: Payer: Self-pay | Admitting: *Deleted

## 2020-10-22 NOTE — Telephone Encounter (Signed)
Reason for Disposition  [1] Chest pain(s) lasting a few seconds AND [2] persists > 3 days  Answer Assessment - Initial Assessment Questions 1. LOCATION: "Where does it hurt?"       Left side of chest  2. RADIATION: "Does the pain go anywhere else?" (e.g., into neck, jaw, arms, back)     Back  3. ONSET: "When did the chest pain begin?" (Minutes, hours or days)      Couple of months  4. PATTERN "Does the pain come and go, or has it been constant since it started?"  "Does it get worse with exertion?"      Comes and goes. With exertion 5. DURATION: "How long does it last" (e.g., seconds, minutes, hours)     Last approx 2 minutes 6. SEVERITY: "How bad is the pain?"  (e.g., Scale 1-10; mild, moderate, or severe)    - MILD (1-3): doesn't interfere with normal activities     - MODERATE (4-7): interferes with normal activities or awakens from sleep    - SEVERE (8-10): excruciating pain, unable to do any normal activities       Mild  7. CARDIAC RISK FACTORS: "Do you have any history of heart problems or risk factors for heart disease?" (e.g., angina, prior heart attack; diabetes, high blood pressure, high cholesterol, smoker, or strong family history of heart disease)     Lung surgery approx 1 year ago  8. PULMONARY RISK FACTORS: "Do you have any history of lung disease?"  (e.g., blood clots in lung, asthma, emphysema, birth control pills)     Na  9. CAUSE: "What do you think is causing the chest pain?"     Not sure  10. OTHER SYMPTOMS: "Do you have any other symptoms?" (e.g., dizziness, nausea, vomiting, sweating, fever, difficulty breathing, cough)       dizziness 11. PREGNANCY: "Is there any chance you are pregnant?" "When was your last menstrual period?"       na  Protocols used: Chest Pain-A-AH

## 2020-10-22 NOTE — Telephone Encounter (Signed)
C/o pressure in left side of chest at time with exertion for a couple of months. Pressure does not last longer than 2 minutes. Hx lung surgery and denies shortness of breath , sweating , no fever. C/o dizziness when bending over and standing back up. C/o pressure is not severe but would like to see PCP . Appt scheduled for 12/06/20. Care advise given. Patient verbalized understanding of care advise and to call back or go to [facility] or ED if symptoms worsen.

## 2020-10-24 ENCOUNTER — Telehealth: Payer: Self-pay | Admitting: Critical Care Medicine

## 2020-10-24 NOTE — Telephone Encounter (Signed)
Pt will need to be worked in sooner for chest pain  any available provider or recommend mobile unit  Note I could not open the original encounter from [facility] that was closed:  please do not close encounters if the MD is to be inquired as to recommended plan of care

## 2020-10-25 NOTE — Telephone Encounter (Signed)
Called pt stated has no pain or symptoms at this time. Aware of UC/ Mobile Clinic /ED options.

## 2020-11-11 ENCOUNTER — Other Ambulatory Visit: Payer: Self-pay | Admitting: Internal Medicine

## 2020-11-11 ENCOUNTER — Other Ambulatory Visit: Payer: Self-pay

## 2020-11-11 MED FILL — Albuterol Sulfate Inhal Aero 108 MCG/ACT (90MCG Base Equiv): RESPIRATORY_TRACT | 25 days supply | Qty: 18 | Fill #0 | Status: AC

## 2020-11-12 ENCOUNTER — Other Ambulatory Visit: Payer: Self-pay

## 2020-11-24 ENCOUNTER — Other Ambulatory Visit: Payer: Self-pay

## 2020-12-05 NOTE — Progress Notes (Signed)
Established Patient Office Visit  Subjective:  Patient ID: Eduardo Moore, male    DOB: 1980-07-31  Age: 40 y.o. MRN: [id number]  CC:  Chief Complaint  Patient presents with   Chest Pain   Shortness of Breath    HPI this visit is accomplished with video interpreter Eduardo Moore #[id number] Eduardo Moore presents for work in visit because of a weeks worth of left-sided chest pain chest pressure wheezing cough preceded by a viral illness.  He has also postnasal drainage but no sinus pressure.  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  The patient ran out of his Breo inhaler and he has been worse since he is off the [employer] inhaler Past Medical History:  Diagnosis Date   Anxiety    Current mild episode of major depressive disorder without prior episode (HCC) 05/04/2020   Depression    Dyspnea    when walking   Generalized abdominal pain 04/28/2020   GERD (gastroesophageal reflux disease)     Past Surgical History:  Procedure Laterality Date   INTERCOSTAL NERVE BLOCK Left 11/14/2019   Procedure: INTERCOSTAL NERVE BLOCK;  Surgeon: Loreli Slot, MD;  Location: [facility] OR;  Service: Thoracic;  Laterality: Left;   LYMPH NODE DISSECTION N/A 11/14/2019   Procedure: LYMPH NODE DISSECTION;  Surgeon: Loreli Slot, MD;  Location: [facility] OR;  Service: Thoracic;  Laterality: N/A;   VIDEO BRONCHOSCOPY WITH ENDOBRONCHIAL NAVIGATION N/A 10/02/2019   Procedure: VIDEO BRONCHOSCOPY WITH ENDOBRONCHIAL NAVIGATION;  Surgeon: Loreli Slot, MD;  Location: [facility] OR;  Service: Thoracic;  Laterality: N/A;    Family History  Problem Relation Age of Onset   Asthma Mother     Social History   Socioeconomic History   Marital status: Single    Spouse name: Not on file   Number of children: Not on file   Years of education: Not on file   Highest education level: Not on file  Occupational History   Not on file  Tobacco Use   Smoking status: Never   Smokeless tobacco: Never  Vaping  Use   Vaping Use: Never used  Substance and Sexual Activity   Alcohol use: Yes    Comment: occasional   Drug use: Never   Sexual activity: Not on file  Other Topics Concern   Not on file  Social History Narrative   Not on file   Social Determinants of Health   Financial Resource Strain: Not on file  Food Insecurity: Not on file  Transportation Needs: Not on file  Physical Activity: Not on file  Stress: Not on file  Social Connections: Not on file  Intimate Partner Violence: Not on file    Outpatient Medications Prior to Visit  Medication Sig Dispense Refill   acetaminophen (TYLENOL) 500 MG tablet Take 500 mg by mouth every 6 (six) hours as needed for mild pain.     albuterol (PROVENTIL) (2.5 MG/3ML) 0.083% nebulizer solution Take 2.5 mg by nebulization 2 (two) times daily as needed for wheezing or shortness of breath.     albuterol (VENTOLIN HFA) 108 (90 Base) MCG/ACT inhaler INHALE 2 PUFFS INTO THE LUNGS EVERY 6 (SIX) HOURS AS NEEDED FOR WHEEZING OR SHORTNESS OF BREATH. 18 g 1   amoxicillin (AMOXIL) 500 MG capsule TAKE 2 CAPSULES (1,000 MG TOTAL) BY MOUTH 2 (TWO) TIMES DAILY FOR 10 DAYS. (Patient not taking: Reported on 12/06/2020) 40 capsule 0   ciprofloxacin (CIPRO) 500 MG tablet TAKE 1 TABLET (500 MG TOTAL) BY MOUTH  2 (TWO) TIMES DAILY FOR 7 DAYS. (Patient not taking: Reported on 12/06/2020) 14 tablet 0   clarithromycin (BIAXIN) 500 MG tablet TAKE 1 TABLET (500 MG TOTAL) BY MOUTH 2 (TWO) TIMES DAILY FOR 10 DAYS. (Patient not taking: Reported on 12/06/2020) 20 tablet 0   fluticasone furoate-vilanterol (BREO ELLIPTA) 200-25 MCG/INH AEPB Inhale 1 puff into the lungs daily. (Patient not taking: Reported on 12/06/2020) 60 each 11   ondansetron (ZOFRAN) 4 MG tablet TAKE 1 TABLET (4 MG TOTAL) BY MOUTH EVERY 8 (EIGHT) HOURS AS NEEDED FOR NAUSEA OR VOMITING. (Patient not taking: Reported on 12/06/2020) 30 tablet 0   pantoprazole (PROTONIX) 40 MG tablet TAKE 1 TABLET (40 MG TOTAL) BY MOUTH  DAILY. (Patient not taking: Reported on 12/06/2020) 30 tablet 3   sucralfate (CARAFATE) 1 g tablet TAKE 1 TABLET (1 G TOTAL) BY MOUTH 4 (FOUR) TIMES DAILY - WITH MEALS AND AT BEDTIME. (Patient not taking: Reported on 12/06/2020) 120 tablet 0   No facility-administered medications prior to visit.    Allergies  Allergen Reactions   Lentil Shortness Of Breath and Rash    lentils   Pea Shortness Of Breath and Rash    Green Peas    ROS Review of Systems  Constitutional:  Negative for fatigue.  HENT:  Negative for nosebleeds, postnasal drip, rhinorrhea, sinus pressure, sinus pain, sneezing and sore throat.   Respiratory:  Positive for shortness of breath and wheezing. Negative for apnea and cough.        Resp sxs are better  Cardiovascular:  Positive for chest pain.       LLL  Gastrointestinal: Negative.   Genitourinary: Negative.   Neurological:  Negative for dizziness, light-headedness and headaches.  Psychiatric/Behavioral: Negative.       Objective:    Physical Exam Vitals reviewed.  Constitutional:      Appearance: Normal appearance. He is well-developed. He is not diaphoretic.  HENT:     Head: Normocephalic and atraumatic.     Nose: No nasal deformity, septal deviation, mucosal edema or rhinorrhea.     Right Sinus: No maxillary sinus tenderness or frontal sinus tenderness.     Left Sinus: No maxillary sinus tenderness or frontal sinus tenderness.     Mouth/Throat:     Pharynx: No oropharyngeal exudate.  Eyes:     General: No scleral icterus.    Conjunctiva/sclera: Conjunctivae normal.     Pupils: Pupils are equal, round, and reactive to light.  Neck:     Thyroid: No thyromegaly.     Vascular: No carotid bruit or JVD.     Trachea: Trachea normal. No tracheal tenderness or tracheal deviation.  Cardiovascular:     Rate and Rhythm: Normal rate and regular rhythm.     Chest Wall: PMI is not displaced.     Pulses: Normal pulses. No decreased pulses.     Heart sounds:  Normal heart sounds, S1 normal and S2 normal. Heart sounds not distant. No murmur heard. No systolic murmur is present.  No diastolic murmur is present.    No friction rub. No gallop. No S3 or S4 sounds.  Pulmonary:     Effort: No tachypnea, accessory muscle usage or respiratory distress.     Breath sounds: No stridor. Examination of the right-lower field reveals wheezing. Examination of the left-lower field reveals wheezing. Decreased breath sounds and wheezing present. No rhonchi or rales.  Chest:     Chest wall: Tenderness present.     Comments: LLL Abdominal:  General: Bowel sounds are normal. There is no distension.     Palpations: Abdomen is soft. Abdomen is not rigid.     Tenderness: There is no abdominal tenderness. There is no guarding or rebound.  Musculoskeletal:        General: Normal range of motion.     Cervical back: Normal range of motion and neck supple. No edema, erythema or rigidity. No muscular tenderness. Normal range of motion.  Lymphadenopathy:     Head:     Right side of head: No submental or submandibular adenopathy.     Left side of head: No submental or submandibular adenopathy.     Cervical: No cervical adenopathy.  Skin:    General: Skin is warm and dry.     Coloration: Skin is not pale.     Findings: No rash.     Nails: There is no clubbing.  Neurological:     General: No focal deficit present.     Mental Status: He is alert and oriented to person, place, and time.     Sensory: No sensory deficit.  Psychiatric:        Speech: Speech normal.        Behavior: Behavior normal.    BP 138/85   Pulse 93   Resp 16   Wt 211 lb 9.6 oz (96 kg)   SpO2 98%   BMI 32.17 kg/m  Wt Readings from Last 3 Encounters:  12/06/20 211 lb 9.6 oz (96 kg)  05/11/20 207 lb (93.9 kg)  05/04/20 208 lb 6.4 oz (94.5 kg)     Health Maintenance Due  Topic Date Due   COVID-19 Vaccine (3 - Booster for Pfizer series) 12/29/2019   INFLUENZA VACCINE  10/11/2020     There are no preventive care reminders to display for this patient.  No results found for: TSH Lab Results  Component Value Date   WBC 8.9 04/26/2020   HGB 15.4 04/26/2020   HCT 44.8 04/26/2020   MCV 87 04/26/2020   PLT 189 04/26/2020   Lab Results  Component Value Date   NA 140 04/26/2020   K 4.5 04/26/2020   CO2 27 12/05/2019   GLUCOSE 94 04/26/2020   BUN 9 04/26/2020   CREATININE 0.89 04/26/2020   BILITOT 1.1 04/26/2020   ALKPHOS 104 04/26/2020   AST 38 04/26/2020   ALT 43 12/05/2019   PROT 7.9 04/26/2020   ALBUMIN 4.8 04/26/2020   CALCIUM 9.8 04/26/2020   ANIONGAP 9 11/20/2019   No results found for: CHOL No results found for: HDL No results found for: LDLCALC No results found for: TRIG No results found for: CHOLHDL No results found for: ZOXW9U    Assessment & Plan:   Problem List Items Addressed This Visit       Respiratory   Asthma, moderate persistent - Primary      Moderate persistent asthma with flare  Plan to begin Breo 1 puff twice daily refill sent   obtain chest x-ray      Relevant Medications   fluticasone furoate-vilanterol (BREO ELLIPTA) 200-25 MCG/INH AEPB   Other Relevant Orders   DG Chest 2 View     Other   Status post robot-assisted surgical procedure   Relevant Orders   DG Chest 2 View   RESOLVED: H. pylori infection   Relevant Medications   pantoprazole (PROTONIX) 40 MG tablet    Meds ordered this encounter  Medications   pantoprazole (PROTONIX) 40 MG tablet    Sig:  TAKE 1 TABLET (40 MG TOTAL) BY MOUTH DAILY.    Dispense:  30 tablet    Refill:  3   fluticasone furoate-vilanterol (BREO ELLIPTA) 200-25 MCG/INH AEPB    Sig: Inhale 1 puff into the lungs daily.    Dispense:  60 each    Refill:  11     Follow-up: Return in about 4 months (around 04/07/2021).    Shan Levans, MD

## 2020-12-06 ENCOUNTER — Other Ambulatory Visit: Payer: Self-pay

## 2020-12-06 ENCOUNTER — Encounter: Payer: Self-pay | Admitting: Critical Care Medicine

## 2020-12-06 ENCOUNTER — Ambulatory Visit
Admission: RE | Admit: 2020-12-06 | Discharge: 2020-12-06 | Disposition: A | Discharge status: Home / Self Care | Payer: No Typology Code available for payment source | Source: Ambulatory Visit | Attending: Critical Care Medicine | Admitting: Critical Care Medicine

## 2020-12-06 ENCOUNTER — Ambulatory Visit: Payer: Self-pay | Attending: Critical Care Medicine | Admitting: Critical Care Medicine

## 2020-12-06 VITALS — BP 138/85 | HR 93 | Resp 16 | Wt 211.6 lb

## 2020-12-06 DIAGNOSIS — A048 Other specified bacterial intestinal infections: Secondary | ICD-10-CM

## 2020-12-06 DIAGNOSIS — Z9889 Other specified postprocedural states: Secondary | ICD-10-CM

## 2020-12-06 DIAGNOSIS — J4541 Moderate persistent asthma with (acute) exacerbation: Secondary | ICD-10-CM

## 2020-12-06 MED ORDER — FLUTICASONE FUROATE-VILANTEROL 200-25 MCG/INH IN AEPB
1.0000 | INHALATION_SPRAY | Freq: Every day | RESPIRATORY_TRACT | 11 refills | Status: DC
  Filled 2020-12-06: qty 120, 60d supply, fill #0

## 2020-12-06 MED ORDER — PANTOPRAZOLE SODIUM 40 MG PO TBEC
DELAYED_RELEASE_TABLET | Freq: Every day | ORAL | 3 refills | Status: DC
  Filled 2020-12-06: qty 30, 30d supply, fill #0

## 2020-12-06 NOTE — Patient Instructions (Signed)
Restart Breo inhaler 1 puff daily  Stay on pantoprazole 1 daily  A chest x-ray is obtained today go to [facility] next-door you do not need an appointment go during normal operating hours  You may return to work at any time with no real restrictions other than avoiding extreme hot or cold temperatures and severe dusty conditions  Return to see Dr. Delford Field in 4 months  Reinicie el inhalador Breo 1 inhalacin diaria  Mantngase en pantoprazol 1 da  Se obtiene una radiografa de trax hoy. Laurena Bering al [facility] de al lado. No necesita una cita. Vaya durante el horario normal de atencin.  Puede volver a trabajar en cualquier momento sin restricciones reales ms que evitar temperaturas extremas de calor o fro y condiciones severas de polvo.  Volver a ver al Dr. Delford Field en 4 meses

## 2020-12-06 NOTE — Assessment & Plan Note (Signed)
   Moderate persistent asthma with flare  Plan to begin Breo 1 puff twice daily refill sent   obtain chest x-ray

## 2021-01-06 ENCOUNTER — Other Ambulatory Visit: Payer: Self-pay

## 2021-01-10 ENCOUNTER — Other Ambulatory Visit: Payer: Self-pay

## 2021-01-10 ENCOUNTER — Other Ambulatory Visit: Payer: Self-pay | Admitting: Nurse Practitioner

## 2021-01-10 ENCOUNTER — Ambulatory Visit
Admission: RE | Admit: 2021-01-10 | Discharge: 2021-01-10 | Disposition: A | Discharge status: Home / Self Care | Payer: No Typology Code available for payment source | Source: Ambulatory Visit | Attending: Nurse Practitioner | Admitting: Nurse Practitioner

## 2021-01-10 DIAGNOSIS — R0989 Other specified symptoms and signs involving the circulatory and respiratory systems: Secondary | ICD-10-CM

## 2021-02-11 ENCOUNTER — Ambulatory Visit: Payer: Self-pay | Admitting: *Deleted

## 2021-02-11 NOTE — Telephone Encounter (Signed)
Pt called in c/o chest congestion, chest pressure, a dry cough, sore throat, abd pain with nausea.   His whole family has been sick and have gotten better but he doesn't seem to be getting better like they did.   He mentioned he had a left lower lobectomy last year and is concerned about the chest congestion and chest pressure.  I instructed him to go to the ED however he didn't want to go there due to the long wait.   I suggested the urgent care would be fine he was agreeable to this however then he mentioned that he went to the [facility] [place] [facility] on [address] [place]) last month.   "They can see me quicker than going to the ED or urgent care".   So I recommended he go on there and be evaluated which he was agreeable to doing.

## 2021-02-11 NOTE — Telephone Encounter (Signed)
Reason for Disposition  Patient sounds very sick or weak to the triager  Answer Assessment - Initial Assessment Questions 1. LOCATION: "Where does it hurt?"      Pt calling in sore throat, coughing, abd pain, nausea, chest congestion.   My wife was sick too. My family has been sick.   My sickness is different.   I feel worse than rest of family.  I have chest congestion and pressure and congestion,   I had lung surgery last year.   They removed part of my left lung last year.   My left lower lobectomy.   Dry cough, scratchy throat, abd pain, no diarrhea or vomiting but nausea yes.  Runny nose too.    Chest pressure too.   I feel pressure in my chest and difficult to inhale.   Difficult to breath.   I'm wheezing.  I suggested he go to the ED at this point or urgent care.     This happened last month too.   I went to family medicine for [facility].   They gave me a couple of shots.   Gave me an anti-inflammatory.     I can go to [facility] on [address].   I can get in there faster than the urgent care or ED.   He has decided to go there instead.  2. RADIATION: "Does the pain shoot anywhere else?" (e.g., chest, back)     No 3. ONSET: "When did the pain begin?" (Minutes, hours or days ago)      N/A 4. SUDDEN: "Gradual or sudden onset?"     N/A 5. PATTERN "Does the pain come and go, or is it constant?"    - If constant: "Is it getting better, staying the same, or worsening?"      (Note: Constant means the pain never goes away completely; most serious pain is constant and it progresses)     - If intermittent: "How long does it last?" "Do you have pain now?"     (Note: Intermittent means the pain goes away completely between bouts)     N/A 6. SEVERITY: "How bad is the pain?"  (e.g., Scale 1-10; mild, moderate, or severe)    - MILD (1-3): doesn't interfere with normal activities, abdomen soft and not tender to touch     - MODERATE (4-7): interferes with normal  activities or awakens from sleep, abdomen tender to touch     - SEVERE (8-10): excruciating pain, doubled over, unable to do any normal activities       N/A 7. RECURRENT SYMPTOM: "Have you ever had this type of stomach pain before?" If Yes, ask: "When was the last time?" and "What happened that time?"      N/A 8. CAUSE: "What do you think is causing the stomach pain?"     N/A 9. RELIEVING/AGGRAVATING FACTORS: "What makes it better or worse?" (e.g., movement, antacids, bowel movement)     N/A 10. OTHER SYMPTOMS: "Do you have any other symptoms?" (e.g., back pain, diarrhea, fever, urination pain, vomiting)       No diarrhea or vomiting but is having nausea.  Answer Assessment - Initial Assessment Questions 1. LOCATION: "Where does it hurt?"       Pt called in c/o having chest congestion, chest pressure, sore throat, abd pain with nausea.   His whole family has been sick but he seems to be worse.   He had a left lower lobectomy done last  year so he is concerned that he is having chest pressure and a dry cough that is not going away. 2. RADIATION: "Does the pain go anywhere else?" (e.g., into neck, jaw, arms, back)     No 3. ONSET: "When did the chest pain begin?" (Minutes, hours or days)      This week 4. PATTERN "Does the pain come and go, or has it been constant since it started?"  "Does it get worse with exertion?"      Having chest pressure and feels bad 5. DURATION: "How long does it last" (e.g., seconds, minutes, hours)     Constant chest pressure with congestion 6. SEVERITY: "How bad is the pain?"  (e.g., Scale 1-10; mild, moderate, or severe)    - MILD (1-3): doesn't interfere with normal activities     - MODERATE (4-7): interferes with normal activities or awakens from sleep    - SEVERE (8-10): excruciating pain, unable to do any normal activities       N/A 7. CARDIAC RISK FACTORS: "Do you have any history of heart problems or risk factors for heart disease?" (e.g., angina, prior  heart attack; diabetes, high blood pressure, high cholesterol, smoker, or strong family history of heart disease)     Not asked 8. PULMONARY RISK FACTORS: "Do you have any history of lung disease?"  (e.g., blood clots in lung, asthma, emphysema, birth control pills)     Yes had a left lower lobectomy a year ago and is having a hard time getting better from the virus that has been going around in his family. 9. CAUSE: "What do you think is causing the chest pain?"     Congestion from being sick. 10. OTHER SYMPTOMS: "Do you have any other symptoms?" (e.g., dizziness, nausea, vomiting, sweating, fever, difficulty breathing, cough)       Dry cough, abd pain with nausea and a sore throat and just feel bad. 11. PREGNANCY: "Is there any chance you are pregnant?" "When was your last menstrual period?"       N/A  Protocols used: Abdominal Pain - Male-A-AH, Chest Pain-A-AH

## 2021-02-11 NOTE — Telephone Encounter (Signed)
Patient to scheduled virtual apt at [facility] next week. Assistance provided by Max Sane for translation and scheduling.   Per Max Sane, I have called him but no answer I have left a vm to call back to schedule or to go to [facility]

## 2021-02-15 ENCOUNTER — Other Ambulatory Visit: Payer: Self-pay

## 2021-02-15 ENCOUNTER — Telehealth (INDEPENDENT_AMBULATORY_CARE_PROVIDER_SITE_OTHER): Payer: Self-pay | Admitting: Nurse Practitioner

## 2021-02-15 ENCOUNTER — Encounter: Payer: Self-pay | Admitting: Nurse Practitioner

## 2021-02-15 DIAGNOSIS — J069 Acute upper respiratory infection, unspecified: Secondary | ICD-10-CM

## 2021-02-15 MED ORDER — PREDNISONE 10 MG PO TABS
ORAL_TABLET | ORAL | 0 refills | Status: DC
  Filled 2021-02-15: qty 20, 8d supply, fill #0

## 2021-02-15 MED ORDER — AZITHROMYCIN 250 MG PO TABS
ORAL_TABLET | ORAL | 0 refills | Status: AC
  Filled 2021-02-15: qty 6, 5d supply, fill #0

## 2021-02-15 NOTE — Progress Notes (Signed)
Virtual Visit via Telephone Note  I connected with Eduardo Moore on 02/15/21 at  2:40 PM EST by telephone and verified that I am speaking with the correct person using two identifiers.  Location: Patient: home Provider: office   I discussed the limitations, risks, security and privacy concerns of performing an evaluation and management service by telephone and the availability of in person appointments. I also discussed with the patient that there may be a patient responsible charge related to this service. The patient expressed understanding and agreed to proceed.   History of Present Illness:  Patient presents today for sick visit through telephone visit.  Spanish interpreter was used for this visit.  Patient states that he is currently experiencing chest congestion and fatigue.  He states that his symptoms started around 4 days ago.  Patient does have albuterol inhaler and nebulizer treatments that he has been using.  He was seen at a Spanish-speaking clinic over the weekend and was prescribed montelukast.  He has been taking this with minimal relief noted. Denies f/c/s, n/v/d, hemoptysis, PND, chest pain or edema.    Observations/Objective:  Vitals with BMI 12/06/2020 05/11/2020 05/04/2020  Height - 5\' 8"  -  Weight 211 lbs 10 oz 207 lbs 208 lbs 6 oz  BMI - 31.48 31.69  Systolic 138 139  Diastolic 85 90 81  Pulse 93 96 96      Assessment and Plan:  URI Chest congestion:   Stay well hydrated  Stay active  Deep breathing exercises  May take tylenol or fever or pain  May take mucinex twice daily  Will order prednisone  Will order azithromycin   Follow up:  Follow up if needed    I discussed the assessment and treatment plan with the patient. The patient was provided an opportunity to ask questions and all were answered. The patient agreed with the plan and demonstrated an understanding of the instructions.   The patient was advised to call back or  seek an in-person evaluation if the symptoms worsen or if the condition fails to improve as anticipated.  I provided 23 minutes of non-face-to-face time during this encounter.   295, NP

## 2021-02-15 NOTE — Patient Instructions (Signed)
URI Chest congestion:   Stay well hydrated  Stay active  Deep breathing exercises  May take tylenol or fever or pain  May take mucinex twice daily  Will order prednisone  Will order azithromycin   Follow up:  Follow up if needed

## 2021-02-16 ENCOUNTER — Other Ambulatory Visit: Payer: Self-pay

## 2021-02-25 ENCOUNTER — Other Ambulatory Visit: Payer: Self-pay

## 2021-04-04 ENCOUNTER — Emergency Department (HOSPITAL_COMMUNITY): Payer: Self-pay

## 2021-04-04 ENCOUNTER — Other Ambulatory Visit: Payer: Self-pay

## 2021-04-04 ENCOUNTER — Ambulatory Visit: Payer: Self-pay | Admitting: *Deleted

## 2021-04-04 ENCOUNTER — Emergency Department (HOSPITAL_COMMUNITY)
Admission: EM | Admit: 2021-04-04 | Discharge: 2021-04-05 | Disposition: A | Discharge status: Home / Self Care | Payer: Self-pay | Attending: Student | Admitting: Student

## 2021-04-04 DIAGNOSIS — R531 Weakness: Secondary | ICD-10-CM | POA: Insufficient documentation

## 2021-04-04 DIAGNOSIS — R112 Nausea with vomiting, unspecified: Secondary | ICD-10-CM | POA: Insufficient documentation

## 2021-04-04 DIAGNOSIS — H538 Other visual disturbances: Secondary | ICD-10-CM | POA: Insufficient documentation

## 2021-04-04 DIAGNOSIS — R519 Headache, unspecified: Secondary | ICD-10-CM | POA: Insufficient documentation

## 2021-04-04 DIAGNOSIS — Z5321 Procedure and treatment not carried out due to patient leaving prior to being seen by health care provider: Secondary | ICD-10-CM | POA: Insufficient documentation

## 2021-04-04 DIAGNOSIS — M542 Cervicalgia: Secondary | ICD-10-CM | POA: Insufficient documentation

## 2021-04-04 NOTE — ED Triage Notes (Signed)
Pt with one week of severe headache but today pain worse and he was asked to sign some papers today and when he was looking at papers his vision became blurry in both eyes, developed nausea, and weakness in BUE. Taking OTC meds without relief. Pain does subside some with rest but comes back when awake. Endorses photophobia and neck pain with some rigidity.

## 2021-04-04 NOTE — Telephone Encounter (Signed)
°  Chief Complaint: multiple symptoms starting with a headache Symptoms: Headache, neck pain and stiffness, blurred vision and dizziness today with headache, chills and nausea today Frequency: Headache for 2 weeks Pertinent Negatives: Patient denies CP Disposition: [x] ED /[] Urgent Care (no appt availability in office) / [] Appointment(In office/virtual)/ []  [facility]/ [] Home Care/ [] Refused Recommended Disposition /[] [facility]/ []  Follow-up with PCP Additional Notes: All symptoms began with a headache early this afternoon while filling out papers. Able to touch chin to neck but increases the neck pain. Denies any others sick in the home. No recent vaccines reported. No history of migraines. Patient stated he would have someone drive him to the ED. Reason for Disposition  [1] SEVERE headache (e.g., excruciating) AND [2] not improved after 2 hours of pain medicine  Answer Assessment - Initial Assessment Questions 1. LOCATION: "Where does it hurt?"      Forehead and sometimes the back of the neck 2. ONSET: "When did the headache start?" (Minutes, hours or days)      2 weeks ago 3. PATTERN: "Does the pain come and go, or has it been constant since it started?"     Comes and goes during the day 4. SEVERITY: "How bad is the pain?" and "What does it keep you from doing?"  (e.g., Scale 1-10; mild, moderate, or severe)   - MILD (1-3): doesn't interfere with normal activities    - MODERATE (4-7): interferes with normal activities or awakens from sleep    - SEVERE (8-10): excruciating pain, unable to do any normal activities        severe 5. RECURRENT SYMPTOM: "Have you ever had headaches before?" If Yes, ask: "When was the last time?" and "What happened that time?"     Used to have headaches 30 years ago he thinks from not eating good.  6. CAUSE: "What do you think is causing the headache?"     none 7. MIGRAINE: "Have you been diagnosed with migraine headaches?" If Yes, ask:  "Is this headache similar?"      No 8. HEAD INJURY: "Has there been any recent injury to the head?"      no 9. OTHER SYMPTOMS: "Do you have any other symptoms?" (fever, stiff neck, eye pain, sore throat, cold symptoms)     Stiff neck and eye pain 10. PREGNANCY: "Is there any chance you are pregnant?" "When was your last menstrual period?"       na  Protocols used: [facility]

## 2021-04-04 NOTE — ED Provider Triage Note (Signed)
Emergency Medicine Provider Triage Evaluation Note  Eduardo Moore , a 41 y.o. male  was evaluated in triage.  Pt complains of 1 week worth of headache from the back of his head that shoots through to his forehead.  Also complaining of full body weakness, nausea and vomiting.  Denies any fevers or chills.  States that he had blurred vision at one point over the week.  No history of hypertension  Review of Systems  Positive: Nausea, vomiting, weakness Negative: Chest pain,  Physical Exam  BP 138/88    Pulse 88    Temp 98.6 F (37 C) (Oral)    Resp 18    SpO2 98%  Gen:   Awake, no distress   Resp:  Normal effort  MSK:   Moves extremities without difficulty  Other:  Full range of motion of all 4 extremities.  5 out of 5 strength and all extremities bilaterally.  Finger-nose intact.  Some horizontal nystagmus with EOMs.  Right pupil slower to react than left.  Medical Decision Making  Medically screening exam initiated at 6:40 PM.  Appropriate orders placed.  Eduardo Moore was informed that the remainder of the evaluation will be completed by another provider, this initial triage assessment does not replace that evaluation, and the importance of remaining in the ED until their evaluation is complete.     Saddie Benders, PA-C 04/04/21 1842

## 2021-04-05 NOTE — ED Notes (Signed)
Pt states they are leaving  

## 2021-04-12 ENCOUNTER — Other Ambulatory Visit: Payer: Self-pay

## 2021-05-09 ENCOUNTER — Encounter: Payer: Self-pay | Admitting: Critical Care Medicine

## 2021-08-23 ENCOUNTER — Other Ambulatory Visit: Payer: Self-pay

## 2021-12-09 IMAGING — DX DG CHEST 2V
2 series · 2 of 2 positions shown · non-contrast
Comparison: 11/20/2018

CLINICAL DATA: Pneumothorax

EXAM:
CHEST - 2 VIEW

[dg chest 2 view (1 of 2)]
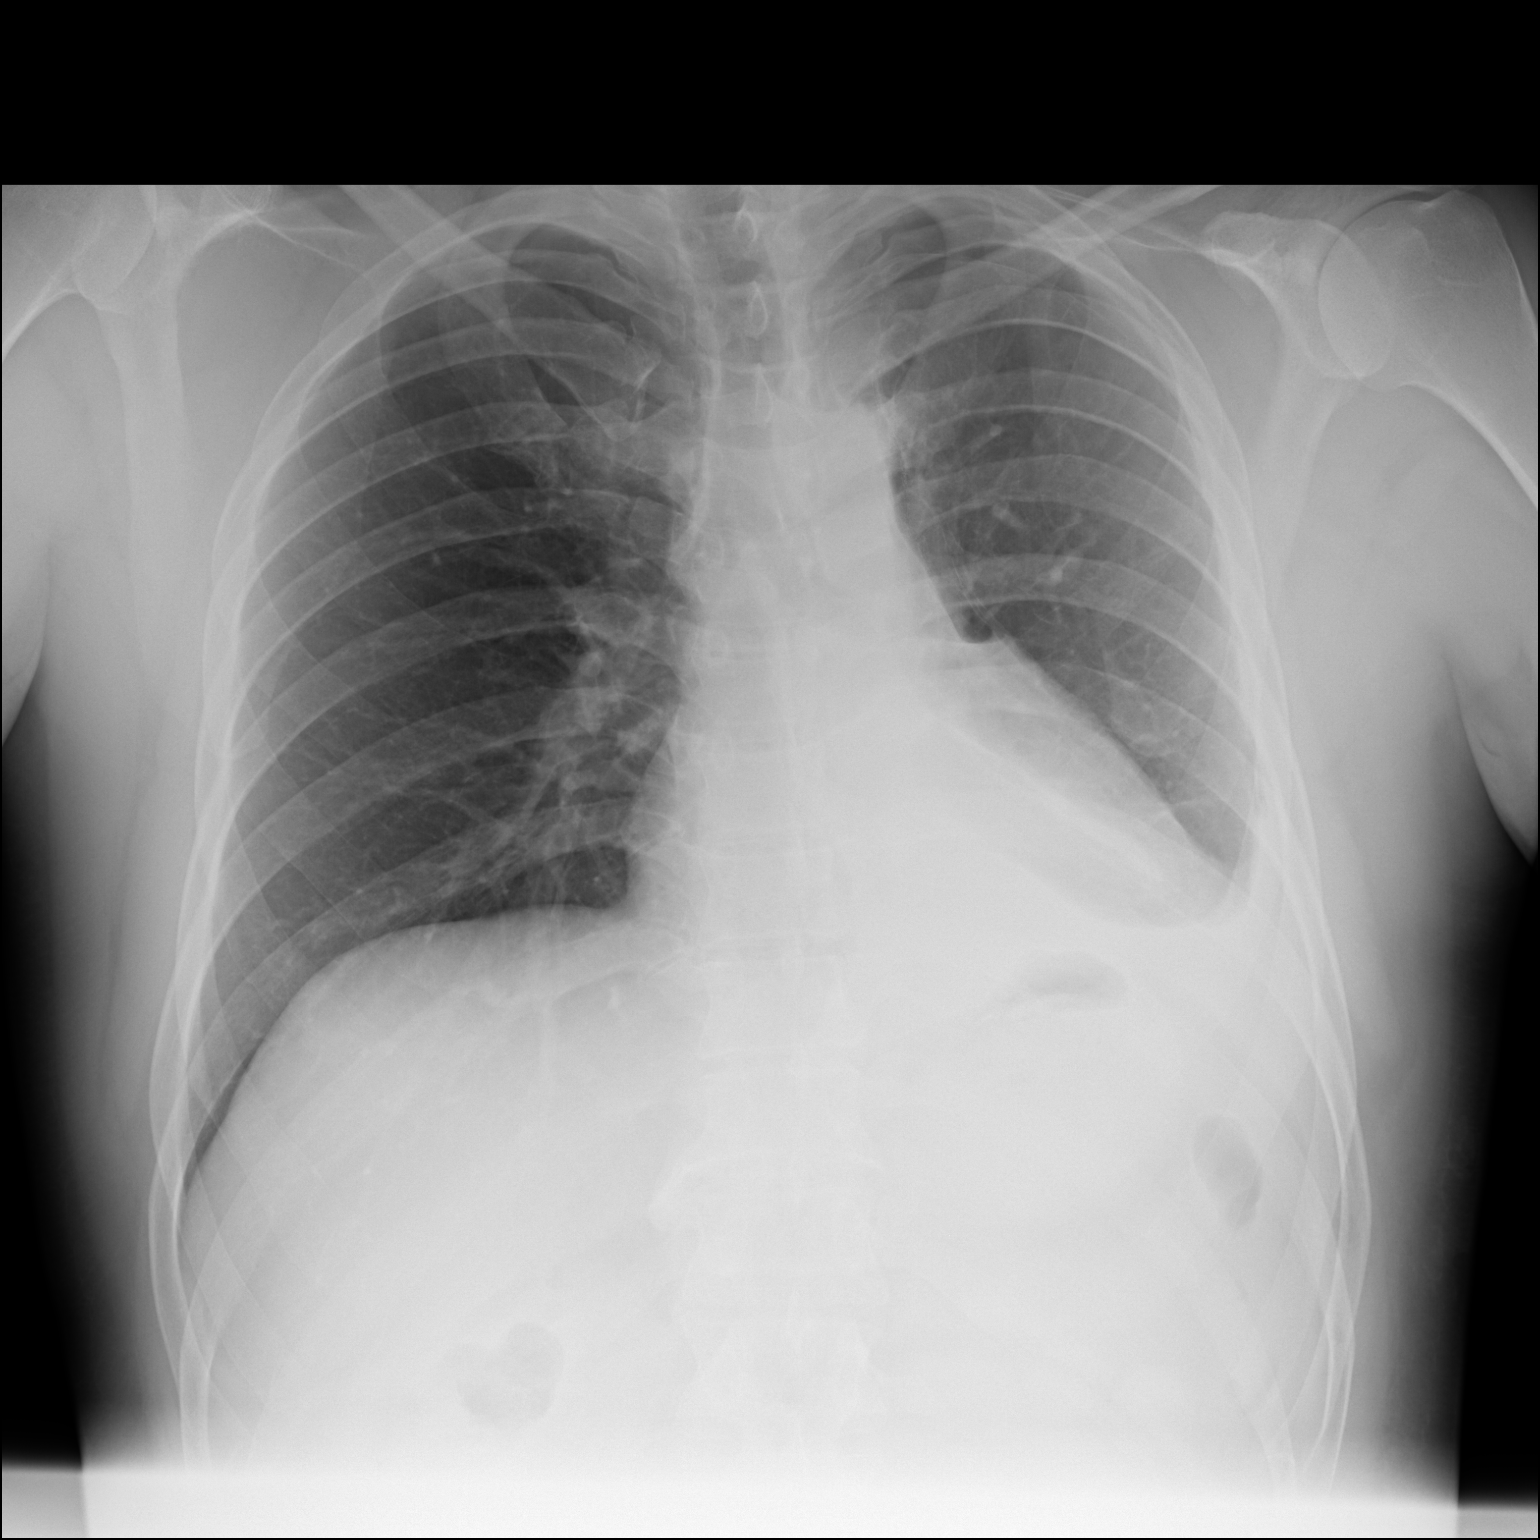

[dg chest 2 view (2 of 2)]
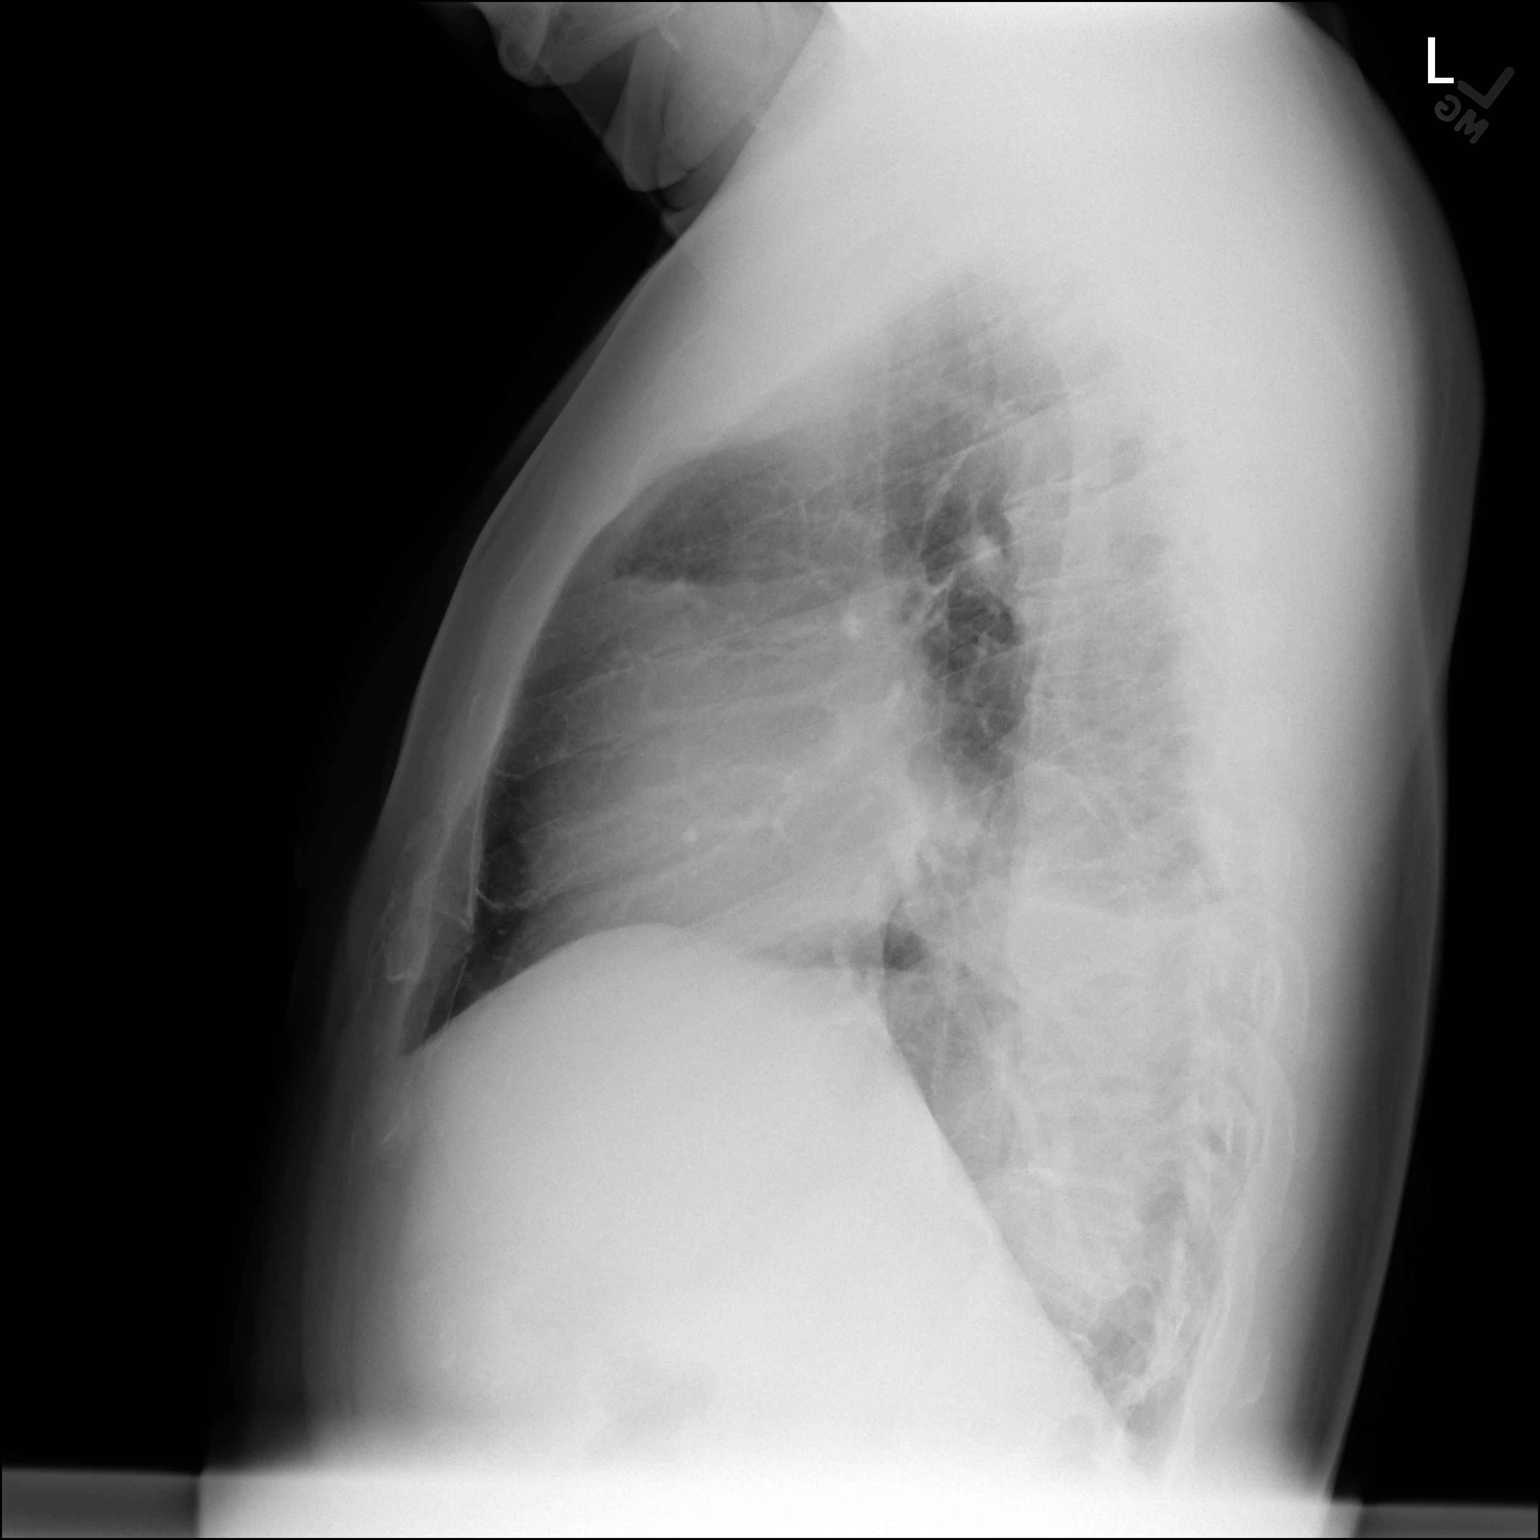

[2 of 2 positions shown; findings below may reference images not displayed]

FINDINGS: Left apical pneumothorax again noted, stable. Small left pleural
effusion with left base atelectasis. Right lung clear. Heart is
normal size.
IMPRESSION: Stable small left apical pneumothorax. Small left effusion with left
base atelectasis.

## 2023-01-25 IMAGING — DX DG CHEST 2V
2 series · 2 of 2 positions shown · non-contrast
Comparison: 12/06/2020 chest radiograph.

CLINICAL DATA: Bibasilar lung rhonchi, history of left lung surgery

EXAM:
CHEST - 2 VIEW

[dg chest 2 view (1 of 2)]
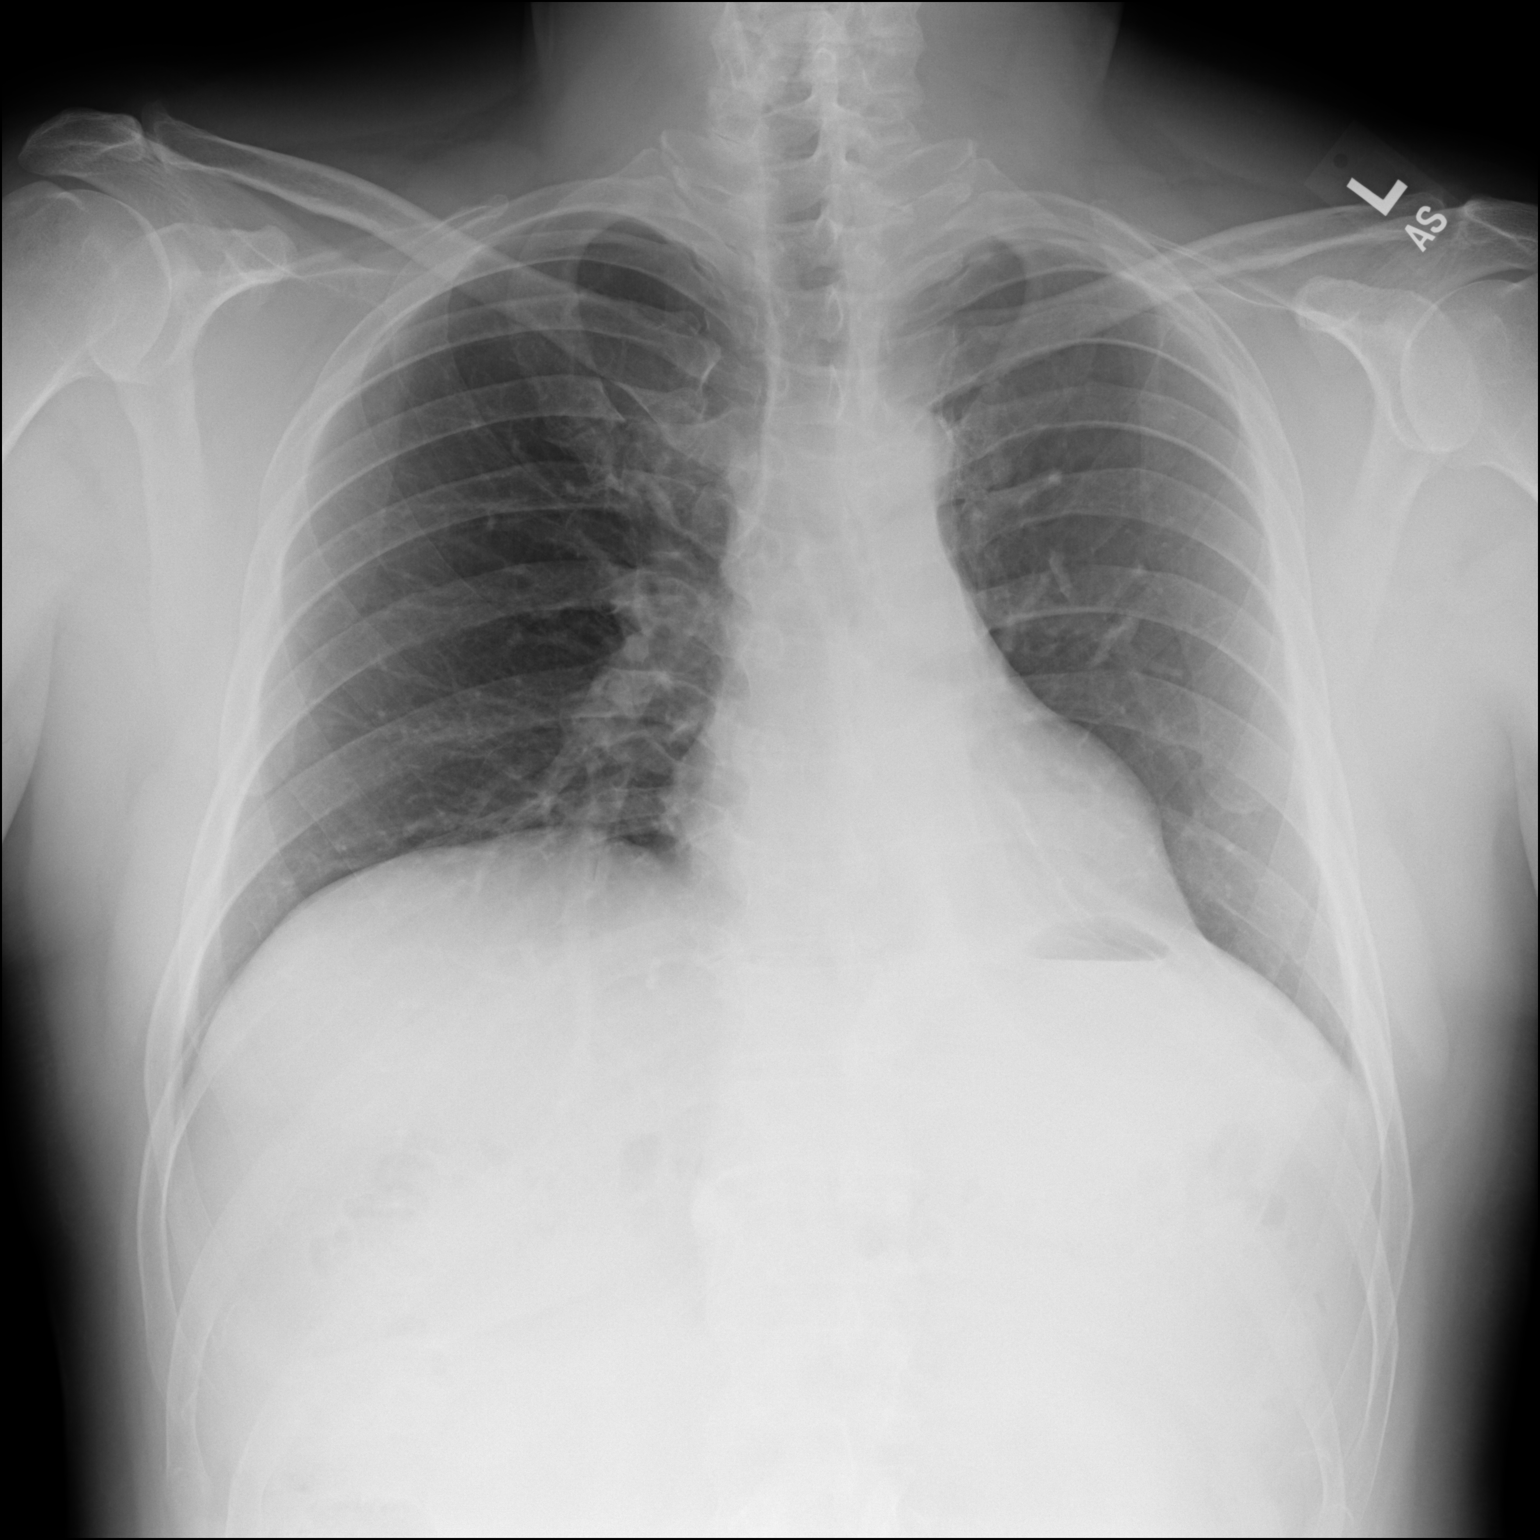

[dg chest 2 view (2 of 2)]
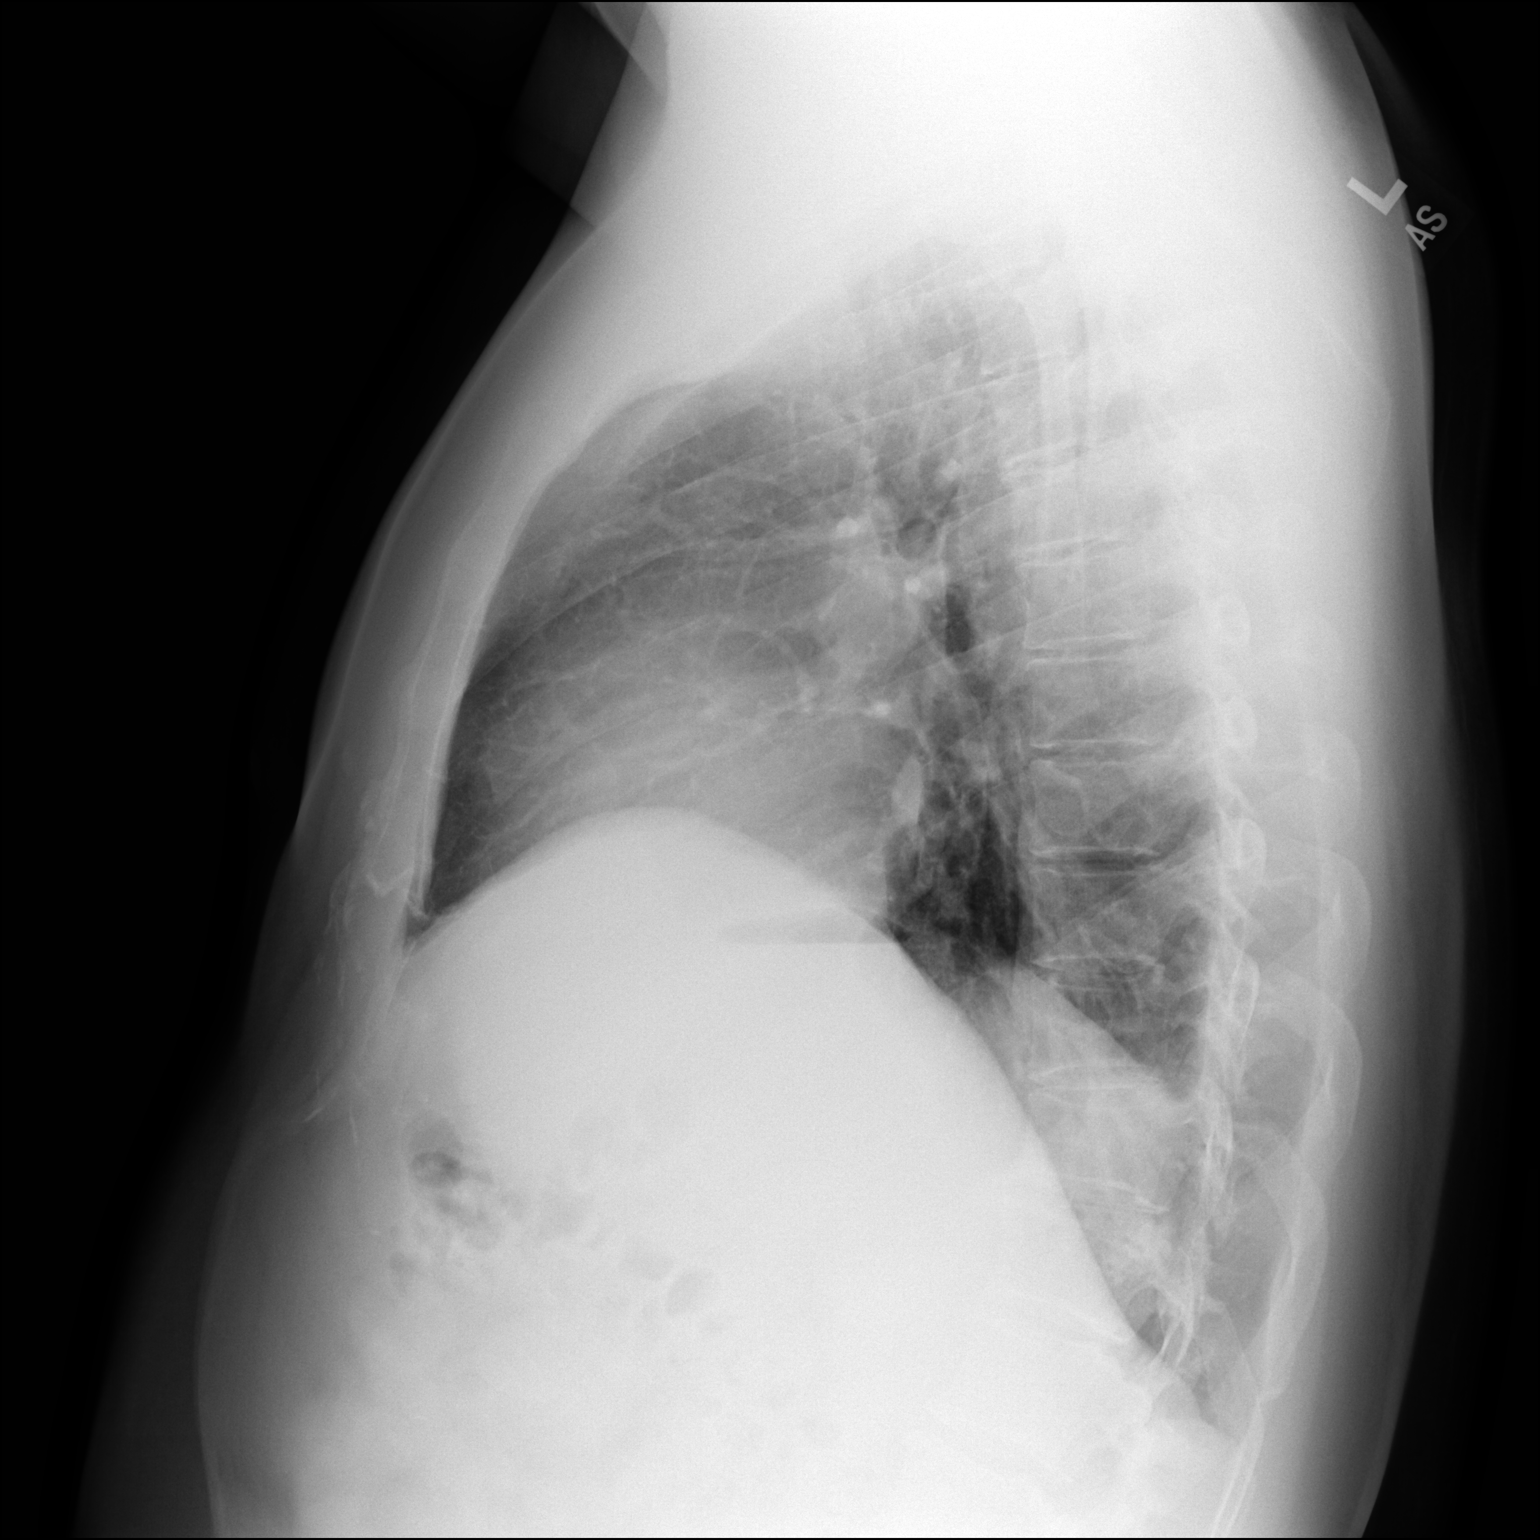

[2 of 2 positions shown; findings below may reference images not displayed]

FINDINGS: Stable cardiomediastinal silhouette with normal heart size. No
pneumothorax. No pleural effusion. No pulmonary edema. Mild volume
loss in the left hemithorax and curvilinear posteromedial left lung
base scarring, unchanged. No acute consolidative airspace disease.
IMPRESSION: 1. No acute cardiopulmonary disease.
2. Stable mild volume loss in the left hemithorax and posteromedial
left lung base scarring.

## 2023-04-02 ENCOUNTER — Encounter: Payer: Self-pay | Admitting: Gastroenterology

## 2023-04-10 DIAGNOSIS — R7303 Prediabetes: Secondary | ICD-10-CM | POA: Insufficient documentation

## 2023-04-30 NOTE — Progress Notes (Unsigned)
 Chief Complaint:LLQ pain Primary GI Doctor:unassigned  HPI:  Patient is a  43  year old male patient with past medical history of anxiety, depression,hx of H pylori, GERD*****who was referred to me by Hinojosa-Clapp, Marcela* on 03/28/23 for a complaint of LLQ pain .      Interval History  Patient admits/denies GERD Patient taking Pantoprazole 40mg  po daily Patient admits/denies dysphagia Patient admits/denies nausea, vomiting, or weight loss  Patient admits/denies altered bowel habits Patient admits/denies abdominal pain Patient admits/denies rectal bleeding   Denies/Admits alcohol Denies/Admits smoking Denies/Admits NSAID use. Denies/Admits they are on blood thinners.  Patients last colonoscopy Patients last EGD  Patient's family history includes  Wt Readings from Last 3 Encounters:  12/06/20 211 lb 9.6 oz (96 kg)  05/11/20 207 lb (93.9 kg)  05/04/20 208 lb 6.4 oz (94.5 kg)      Past Medical History:  Diagnosis Date   Anxiety    Current mild episode of major depressive disorder without prior episode (HCC) 05/04/2020   Depression    Dyspnea    when walking   Generalized abdominal pain 04/28/2020   GERD (gastroesophageal reflux disease)     Past Surgical History:  Procedure Laterality Date   INTERCOSTAL NERVE BLOCK Left 11/14/2019   Procedure: INTERCOSTAL NERVE BLOCK;  Surgeon: Loreli Slot, MD;  Location: [facility] OR;  Service: Thoracic;  Laterality: Left;   LYMPH NODE DISSECTION N/A 11/14/2019   Procedure: LYMPH NODE DISSECTION;  Surgeon: Loreli Slot, MD;  Location: [facility] OR;  Service: Thoracic;  Laterality: N/A;   VIDEO BRONCHOSCOPY WITH ENDOBRONCHIAL NAVIGATION N/A 10/02/2019   Procedure: VIDEO BRONCHOSCOPY WITH ENDOBRONCHIAL NAVIGATION;  Surgeon: Loreli Slot, MD;  Location: [facility] OR;  Service: Thoracic;  Laterality: N/A;    Current Outpatient Medications  Medication Sig Dispense Refill   acetaminophen (TYLENOL) 500 MG tablet Take 500 mg by  mouth every 6 (six) hours as needed for mild pain.     albuterol (PROVENTIL) (2.5 MG/3ML) 0.083% nebulizer solution Take 2.5 mg by nebulization 2 (two) times daily as needed for wheezing or shortness of breath.     albuterol (VENTOLIN HFA) 108 (90 Base) MCG/ACT inhaler INHALE 2 PUFFS INTO THE LUNGS EVERY 6 (SIX) HOURS AS NEEDED FOR WHEEZING OR SHORTNESS OF BREATH. 18 g 1   fluticasone furoate-vilanterol (BREO ELLIPTA) 200-25 MCG/INH AEPB Inhale 1 puff into the lungs daily. 60 each 11   pantoprazole (PROTONIX) 40 MG tablet TAKE 1 TABLET (40 MG TOTAL) BY MOUTH DAILY. 30 tablet 3   predniSONE (DELTASONE) 10 MG tablet Take 4 tabs for 2 days, then 3 tabs for 2 days, then 2 tabs for 2 days, then 1 tab for 2 days, then stop 20 tablet 0   No current facility-administered medications for this visit.    Allergies as of 05/02/2023 - Review Complete 04/04/2021  Allergen Reaction Noted   Lentil Shortness Of Breath and Rash 09/18/2019   Pea Shortness Of Breath and Rash 09/18/2019    Family History  Problem Relation Age of Onset   Asthma Mother     Review of Systems:    Constitutional: No weight loss, fever, chills, weakness or fatigue HEENT: Eyes: No change in vision               Ears, Nose, Throat:  No change in hearing or congestion Skin: No rash or itching Cardiovascular: No chest pain, chest pressure or palpitations   Respiratory: No SOB or cough Gastrointestinal: See HPI and otherwise negative Genitourinary:  No dysuria or change in urinary frequency Neurological: No headache, dizziness or syncope Musculoskeletal: No new muscle or joint pain Hematologic: No bleeding or bruising Psychiatric: No history of depression or anxiety    Physical Exam:  Vital signs: There were no vitals taken for this visit.  Constitutional:   Pleasant Caucasian male*** appears to be in NAD, Well developed, Well nourished, alert and cooperative Head:  Normocephalic and atraumatic. Eyes:   PEERL, EOMI. No  icterus. Conjunctiva pink. Ears:  Normal auditory acuity. Neck:  Supple Throat: Oral cavity and pharynx without inflammation, swelling or lesion.  Respiratory: Respirations even and unlabored. Lungs clear to auscultation bilaterally.   No wheezes, crackles, or rhonchi.  Cardiovascular: Normal S1, S2. Regular rate and rhythm. No peripheral edema, cyanosis or pallor.  Gastrointestinal:  Soft, nondistended, nontender. No rebound or guarding. Normal bowel sounds. No appreciable masses or hepatomegaly. Rectal:  Not performed.  Anoscopy: Msk:  Symmetrical without gross deformities. Without edema, no deformity or joint abnormality.  Neurologic:  Alert and  oriented x4;  grossly normal neurologically.  Skin:   Dry and intact without significant lesions or rashes. Psychiatric: Oriented to person, place and time. Demonstrates good judgement and reason without abnormal affect or behaviors.  RELEVANT LABS AND IMAGING: CBC    Latest Ref Rng & Units 04/26/2020   11:55 AM 12/05/2019   10:44 AM 11/20/2019    1:58 PM  CBC  WBC 3.4 - 10.8 x10E3/uL 8.9  8.7  10.1   Hemoglobin 13.0 - 17.7 g/dL 30.8  65.7  84.6   Hematocrit 37.5 - 51.0 % 44.8  40.1  41.1   Platelets 150 - 450 x10E3/uL 189  312  209      CMP     Latest Ref Rng & Units 04/26/2020   11:55 AM 12/05/2019   10:44 AM 11/20/2019    1:58 PM  CMP  Glucose 65 - 99 mg/dL 94  86  962   BUN 6 - 24 mg/dL 9  9  9    Creatinine 0.76 - 1.27 mg/dL 9.52  8.41  3.24   Sodium 134 - 144 mmol/L 140  139  136   Potassium 3.5 - 5.2 mmol/L 4.5  4.2  4.2   Chloride 96 - 106 mmol/L 101  100  100   CO2 20 - 29 mmol/L  27  27   Calcium 8.7 - 10.2 mg/dL 9.8  40.1  9.3   Total Protein 6.0 - 8.5 g/dL 7.9  7.2    Total Bilirubin 0.0 - 1.2 mg/dL 1.1  0.6    Alkaline Phos 44 - 121 IU/L 104  118    AST 0 - 40 IU/L 38  14    ALT 0 - 44 IU/L  43     04/22/2023 tested positive for flu  Assessment: 1. ***  Plan: 1. ***   Thank you for the courtesy of this consult.  Please call me with any questions or concerns.   Jodean Valade, FNP-C [facility] 04/30/2023, 4:34 PM  Cc: Hinojosa-Clapp, Marcela*

## 2023-05-02 ENCOUNTER — Encounter: Payer: Self-pay | Admitting: Gastroenterology

## 2023-05-02 ENCOUNTER — Ambulatory Visit (INDEPENDENT_AMBULATORY_CARE_PROVIDER_SITE_OTHER): Payer: PRIVATE HEALTH INSURANCE | Admitting: Gastroenterology

## 2023-05-02 VITALS — BP 130/72 | HR 76 | Ht 68.0 in | Wt 192.2 lb

## 2023-05-02 DIAGNOSIS — R140 Abdominal distension (gaseous): Secondary | ICD-10-CM

## 2023-05-02 DIAGNOSIS — K5904 Chronic idiopathic constipation: Secondary | ICD-10-CM | POA: Diagnosis not present

## 2023-05-02 DIAGNOSIS — R110 Nausea: Secondary | ICD-10-CM | POA: Diagnosis not present

## 2023-05-02 DIAGNOSIS — R1032 Left lower quadrant pain: Secondary | ICD-10-CM | POA: Diagnosis not present

## 2023-05-02 NOTE — Patient Instructions (Addendum)
 We have scheduled your follow up appointment for 07/30/23 at 10:00 am.  Restart Metamucil 1 tsp po daily, increase to 2 tsp after 2 weeks Recommend Miralax as needed for constipation Recommend Low Fodmap diet Samples of OTC Ibgard 2 capsules with meals for gas and bloat Can also use OTC Gas-X Recommend you purchase a squatty potty We will do colon screening colonoscopy at age 43  Due to recent changes in healthcare laws, you may see the results of your imaging and laboratory studies on MyChart before your provider has had a chance to review them.  We understand that in some cases there may be results that are confusing or concerning to you. Not all laboratory results come back in the same time frame and the provider may be waiting for multiple results in order to interpret others.  Please give [place] 48 hours in order for your provider to thoroughly review all the results before contacting the office for clarification of your results.   It was a pleasure to see you today!  Thank you for trusting me with your gastrointestinal care!

## 2023-07-30 ENCOUNTER — Ambulatory Visit: Payer: PRIVATE HEALTH INSURANCE | Admitting: Gastroenterology

## 2023-07-30 ENCOUNTER — Encounter: Payer: Self-pay | Admitting: Gastroenterology

## 2023-07-30 VITALS — BP 118/74 | HR 80 | Ht 68.0 in | Wt 186.0 lb

## 2023-07-30 DIAGNOSIS — K5904 Chronic idiopathic constipation: Secondary | ICD-10-CM | POA: Diagnosis not present

## 2023-07-30 NOTE — Progress Notes (Addendum)
 Chief Complaint:follow-up LLQ pain, constipation, nausea Primary GI Doctor:Dr. Yvone Herd  HPI: Patient is a 43 year old male patient with past medical history of anxiety, depression, hx of H pylori, GERD, who was referred to me by Hinojosa-Clapp, Marcela* on 03/28/23 for a complaint of LLQ pain.  Last seen in GI office by myself on 05/02/23. Patient started on fiber supplementation and daily Miralax  for CIC. If these measures are ineffective can consider other agents such as Amitiza, Trulance or Linzess.   Interval History    Patient presents for follow-up on constipation. He has started OTC Metamucil  2 scoops daily and he states this has regulated his bowels. He has not had any issues with abdominal pain or nausea. If he skips a dose he notices a difference. He has not required the OTC Miralax . He recently had issues with allergies and taking Claratin po daily. Overall, he is doing well.   Wt Readings from Last 3 Encounters:  07/30/23 186 lb (84.4 kg)  05/02/23 192 lb 3.2 oz (87.2 kg)  12/06/20 211 lb 9.6 oz (96 kg)    Past Medical History:  Diagnosis Date   Anxiety    Current mild episode of major depressive disorder without prior episode (HCC) 05/04/2020   Depression    Dyspnea    when walking   Generalized abdominal pain 04/28/2020   GERD (gastroesophageal reflux disease)    Thyroid disease     Past Surgical History:  Procedure Laterality Date   INTERCOSTAL NERVE BLOCK Left 11/14/2019   Procedure: INTERCOSTAL NERVE BLOCK;  Surgeon: Zelphia Higashi, MD;  Location: [facility] OR;  Service: Thoracic;  Laterality: Left;   LYMPH NODE DISSECTION N/A 11/14/2019   Procedure: LYMPH NODE DISSECTION;  Surgeon: Zelphia Higashi, MD;  Location: [facility] OR;  Service: Thoracic;  Laterality: N/A;   VIDEO BRONCHOSCOPY WITH ENDOBRONCHIAL NAVIGATION N/A 10/02/2019   Procedure: VIDEO BRONCHOSCOPY WITH ENDOBRONCHIAL NAVIGATION;  Surgeon: Zelphia Higashi, MD;  Location: [facility] OR;  Service: Thoracic;   Laterality: N/A;    Current Outpatient Medications  Medication Sig Dispense Refill   metFORMIN (GLUCOPHAGE-XR) 750 MG 24 hr tablet Take 750 mg by mouth.     psyllium (METAMUCIL) 58.6 % packet Take 1 packet by mouth daily.     No current facility-administered medications for this visit.    Allergies as of 07/30/2023 - Review Complete 07/30/2023  Allergen Reaction Noted   Lentil Shortness Of Breath and Rash 09/18/2019   Pea Shortness Of Breath and Rash 09/18/2019    Family History  Problem Relation Age of Onset   Asthma Mother    Colitis Mother    Colitis Father    Colon cancer Neg Hx    Esophageal cancer Neg Hx    Stomach cancer Neg Hx     Review of Systems:    Constitutional: No weight loss, fever, chills, weakness or fatigue HEENT: Eyes: No change in vision               Ears, Nose, Throat:  No change in hearing or congestion Skin: No rash or itching Cardiovascular: No chest pain, chest pressure or palpitations   Respiratory: No SOB or cough Gastrointestinal: See HPI and otherwise negative Genitourinary: No dysuria or change in urinary frequency Neurological: No headache, dizziness or syncope Musculoskeletal: No new muscle or joint pain Hematologic: No bleeding or bruising Psychiatric: No history of depression or anxiety    Physical Exam:  Vital signs: BP 118/74   Pulse 80  Ht 5\' 8"  (1.727 m)   Wt 186 lb (84.4 kg)   SpO2 98%   BMI 28.28 kg/m   Constitutional:   Pleasant male appears to be in NAD, Well developed, Well nourished, alert and cooperative Throat: Oral cavity and pharynx without inflammation, swelling or lesion.  Respiratory: Respirations even and unlabored. Lungs clear to auscultation bilaterally.   Wheezing noted on left side.  Cardiovascular: Normal S1, S2. Regular rate and rhythm. No peripheral edema, cyanosis or pallor.  Gastrointestinal:  Soft, nondistended, nontender. No rebound or guarding. Normal bowel sounds. No appreciable masses or  hepatomegaly. Rectal:  Not performed.  Msk:  Symmetrical without gross deformities. Without edema, no deformity or joint abnormality.  Neurologic:  Alert and  oriented x4;  grossly normal neurologically.  Skin:   Dry and intact without significant lesions or rashes. Psychiatric: Oriented to person, place and time. Demonstrates good judgement and reason without abnormal affect or behaviors.  RELEVANT LABS AND IMAGING: CBC    Latest Ref Rng & Units 04/26/2020   11:55 AM 12/05/2019   10:44 AM 11/20/2019    1:58 PM  CBC  WBC 3.4 - 10.8 x10E3/uL 8.9  8.7  10.1   Hemoglobin 13.0 - 17.7 g/dL 16.1  09.6  04.5   Hematocrit 37.5 - 51.0 % 44.8  40.1  41.1   Platelets 150 - 450 x10E3/uL 189  312  209      CMP     Latest Ref Rng & Units 04/26/2020   11:55 AM 12/05/2019   10:44 AM 11/20/2019    1:58 PM  CMP  Glucose 65 - 99 mg/dL 94  86  409   BUN 6 - 24 mg/dL 9  9  9    Creatinine 0.76 - 1.27 mg/dL 8.11  9.14  7.82   Sodium 134 - 144 mmol/L 140  139  136   Potassium 3.5 - 5.2 mmol/L 4.5  4.2  4.2   Chloride 96 - 106 mmol/L 101  100  100   CO2 20 - 29 mmol/L  27  27   Calcium 8.7 - 10.2 mg/dL 9.8  95.6  9.3   Total Protein 6.0 - 8.5 g/dL 7.9  7.2    Total Bilirubin 0.0 - 1.2 mg/dL 1.1  0.6    Alkaline Phos 44 - 121 IU/L 104  118    AST 0 - 40 IU/L 38  14    ALT 0 - 44 IU/L  43     10/1922 h pylori breath test negative   Assessment: Encounter Diagnosis  Name Primary?   Chronic idiopathic constipation Yes    43 year old male patient who presents for follow-up on chronic constipation who has responded well to Metamucil po daily. He will be due for colon screening colonoscopy age 68, which will be next January.   Plan: - Continue OTC Metamucil po daily -Recommend colon screening colonoscopy at age 65   Thank you for the courtesy of this consult. Please call me with any questions or concerns.   Sheranda Seabrooks, FNP-C [facility] 07/30/2023, 10:26 AM  Cc: Vernell Goldsmith,  MD  I have reviewed the clinic note as outlined by Arlon Lamb, NP and agree with the assessment, plan and medical decision making.  Mr. Xavior Niazi was previously seen in the GI office for symptoms of constipation.  He was advised to take Metamucil which has ameliorated his symptoms.  He has not required MiraLAX .  No need for escalation to prescription treatment.  Agree with continuing current fiber regimen and screening colonoscopy when he turns 44.  Eugenia Hess, MD

## 2023-07-30 NOTE — Patient Instructions (Addendum)
-  Continue High fiber diet - Continue Metamucil po daily -  OTC Endomune, Align or VSL #3 probiotics ok to use short term 1-2 mths -Recommend colon screening at age 43  _______________________________________________________  If your blood pressure at your visit was 140/90 or greater, please contact your primary care physician to follow up on this.  _______________________________________________________  If you are age 14 or older, your body mass index should be between 23-30. Your Body mass index is 28.28 kg/m. If this is out of the aforementioned range listed, please consider follow up with your Primary Care Provider.  If you are age 65 or younger, your body mass index should be between 19-25. Your Body mass index is 28.28 kg/m. If this is out of the aformentioned range listed, please consider follow up with your Primary Care Provider.   ________________________________________________________  The [facility] providers would like to encourage you to use MYCHART to communicate with providers for non-urgent requests or questions.  Due to long hold times on the telephone, sending your provider a message by [facility] may be a faster and more efficient way to get a response.  Please allow 48 business hours for a response.  Please remember that this is for non-urgent requests.  _______________________________________________________ Thank you for trusting me with your gastrointestinal care. Deanna May, RNP

## 2023-11-15 ENCOUNTER — Ambulatory Visit: Payer: Self-pay

## 2023-11-15 NOTE — Telephone Encounter (Signed)
 Pt called back in, speaking to another triage nurse.

## 2023-11-15 NOTE — Telephone Encounter (Signed)
 This RN attempted x1 to contact patient, left vm requesting call back.        Message from [facility] sent at 11/15/2023  9:17 AM EDT  Stomach problems.Eduardo Moore losing weight.. no appetite ( he didn't need interpreter )

## 2023-11-15 NOTE — Telephone Encounter (Signed)
 FYI Only or Action Required?: FYI only for provider.  Patient was last seen in primary care on 02/15/2021 by Oley Bascom RAMAN, NP.  Called Nurse Triage reporting Abdominal Pain and Weight Loss.  Symptoms began several days ago.  Interventions attempted: OTC medications: sucrafate and Prescription medications: metamucil.  Symptoms are: gradually worsening.  Triage Disposition: See Physician Within 24 Hours  Patient/caregiver understands and will follow disposition?: Yes      Reason for Disposition  [1] MODERATE pain (e.g., interferes with normal activities) AND [2] pain comes and goes (cramps) AND [3] present > 24 hours  (Exception: Pain with Vomiting or Diarrhea - see that Guideline.)  Answer Assessment - Initial Assessment Questions 1. LOCATION: Where does it hurt?      L lower next to belly button 2. RADIATION: Does the pain shoot anywhere else? (e.g., chest, back)     denies 3. ONSET: When did the pain begin? (Minutes, hours or days ago)      Ongoing for the past few months, worsened this week 4. SUDDEN: Gradual or sudden onset?     gradual 5. PATTERN Does the pain come and go, or is it constant?     Comes and goes 6. SEVERITY: How bad is the pain?  (e.g., Scale 1-10; mild, moderate, or severe)     8/10 Endorses taking sucrafate/metamucil with no relief 7. RECURRENT SYMPTOM: Have you ever had this type of stomach pain before? If Yes, ask: When was the last time? and What happened that time?      denies 8. CAUSE: What do you think is causing the stomach pain? (e.g., gallstones, recent abdominal surgery)     unknown 9. RELIEVING/AGGRAVATING FACTORS: What makes it better or worse? (e.g., antacids, bending or twisting motion, bowel movement)     unknown 10. OTHER SYMPTOMS: Do you have any other symptoms? (e.g., back pain, diarrhea, fever, urination pain, vomiting)       Poor appetite, weight loss, dizziness when picking up something from the floor     Triager attempted to schedule with PCP, but no access. Scheduled with [facility] instead.  Answer Assessment - Initial Assessment Questions 1. MAIN CONCERN: What is your main concern today?     Poor appetite, losing weight 2. WEIGHT LOSS: How much weight have you lost?  (e.g., lbs., kgs.)  Over what period of time have you lost this weight?  (e.g., number of days, weeks, months, years)     *No Answer* 3. BASELINE WEIGHT: What is your baseline or normal weight? (e.g., How much do you usually weigh?)     *No Answer* 4. CAUSE: What do you think is causing the weight loss? (e.g., depression, anxiety, medicine side effect, pain, trouble swallowing, substance or alcohol use problem, eating disorder)     *No Answer* 5. PRIOR EVALUATION: Have you been evaluated by a doctor for your weight loss? If Yes, ask When was your last visit? What did your doctor (or NP/PA) tell you about the possible cause?     *No Answer* 6. HEART FAILURE TREATMENT: Do you have heart failure? If Yes, ask: Have you taken new or extra water pills (diuretics) recently? (e.g., furosemide; bumetanide). What is your target weight?     *No Answer* 7. OTHER SYMPTOMS: Do you have any other symptoms? (e.g., anxiety or depression, blood in stool, breathing difficulty, diarrhea, fever, trouble swallowing)     *No Answer* 8. PREGNANCY: Is there any chance you are pregnant? When was your last menstrual period?     *  No Answer*  Protocols used: Abdominal Pain - Male-A-AH, Weight Loss - Unintended-A-AH

## 2023-11-16 ENCOUNTER — Ambulatory Visit (HOSPITAL_COMMUNITY)
Admission: RE | Admit: 2023-11-16 | Discharge: 2023-11-16 | Disposition: A | Discharge status: Home / Self Care | Payer: Self-pay | Source: Ambulatory Visit | Attending: Internal Medicine | Admitting: Internal Medicine

## 2023-11-16 ENCOUNTER — Ambulatory Visit (INDEPENDENT_AMBULATORY_CARE_PROVIDER_SITE_OTHER): Payer: Self-pay

## 2023-11-16 ENCOUNTER — Encounter (HOSPITAL_COMMUNITY): Payer: Self-pay

## 2023-11-16 VITALS — BP 131/81 | HR 81 | Temp 98.3°F | Resp 18 | Ht 68.0 in | Wt 181.6 lb

## 2023-11-16 DIAGNOSIS — R110 Nausea: Secondary | ICD-10-CM | POA: Insufficient documentation

## 2023-11-16 DIAGNOSIS — R1032 Left lower quadrant pain: Secondary | ICD-10-CM | POA: Insufficient documentation

## 2023-11-16 LAB — COMPREHENSIVE METABOLIC PANEL WITH GFR
ALT: 23 U/L (ref 0–44)
AST: 23 U/L (ref 15–41)
Albumin: 4.2 g/dL (ref 3.5–5.0)
Alkaline Phosphatase: 54 U/L (ref 38–126)
Anion gap: 11 (ref 5–15)
BUN: 8 mg/dL (ref 6–20)
CO2: 23 mmol/L (ref 22–32)
Calcium: 9.2 mg/dL (ref 8.9–10.3)
Chloride: 104 mmol/L (ref 98–111)
Creatinine, Ser: 0.82 mg/dL (ref 0.61–1.24)
GFR, Estimated: >60 mL/min (ref >60)
Glucose, Bld: 81 mg/dL (ref 70–99)
Potassium: 4.1 mmol/L (ref 3.5–5.1)
Sodium: 138 mmol/L (ref 135–145)
Total Bilirubin: 1.7 mg/dL — ABNORMAL HIGH (ref 0.0–1.2)
Total Protein: 7 g/dL (ref 6.5–8.1)

## 2023-11-16 LAB — CBC
HCT: 43.8 % (ref 39.0–52.0)
Hemoglobin: 14.4 g/dL (ref 13.0–17.0)
MCH: 30.4 pg (ref 26.0–34.0)
MCHC: 32.9 g/dL (ref 30.0–36.0)
MCV: 92.4 fL (ref 80.0–100.0)
Platelets: 156 K/uL (ref 150–400)
RBC: 4.74 MIL/uL (ref 4.22–5.81)
RDW: 12.5 % (ref 11.5–15.5)
WBC: 5.2 K/uL (ref 4.0–10.5)
nRBC: 0 % (ref 0.0–0.2)

## 2023-11-16 LAB — POCT URINALYSIS DIP (MANUAL ENTRY)
Bilirubin, UA: NEGATIVE
Blood, UA: NEGATIVE
Glucose, UA: NEGATIVE mg/dL
Ketones, POC UA: NEGATIVE mg/dL
Leukocytes, UA: NEGATIVE
Nitrite, UA: NEGATIVE
Protein Ur, POC: NEGATIVE mg/dL
Spec Grav, UA: 1.01 (ref 1.010–1.025)
Urobilinogen, UA: 0.2 U/dL
pH, UA: 6 (ref 5.0–8.0)

## 2023-11-16 LAB — LIPASE, BLOOD: Lipase: 36 U/L (ref 11–51)

## 2023-11-16 NOTE — Telephone Encounter (Signed)
 Noted. Patient has appointment  today at [facility]

## 2023-11-16 NOTE — Discharge Instructions (Addendum)
 Urinalysis done today did not show infection.  X-ray of the abdomen done today did not show any acute findings however there does appear to be a large amount of stool.  Blood work including complete blood count, complete metabolic panel and lipase have been drawn today.  These results will take approximately 24 hours to get final results.  If there are any significant abnormalities we will contact you and make recommendations.  These test will check your blood counts, kidney function, liver function and pancreas.  Symptoms could be associated with constipation or possibly and irritable bowel syndrome.  Vital signs and physical exam findings as well as workup thus far is reassuring and it is reasonable for you to be discharged home with close monitoring.  If symptoms worsen significantly then would recommend going to the emergency room for further evaluation.  Recommend contacting gastroenterology as soon as possible to schedule a follow-up appointment for recheck of symptoms.  May return to urgent care as needed

## 2023-11-16 NOTE — ED Triage Notes (Signed)
 Abdominal pain reoccurring in the last 3 weeks with nausea, decreased appetite, weakness in the legs, and dizziness. Denies any emesis, diarrhea, medication or dietary changes, or anyone with similar symptoms.   States that he was see by GI and treated with Sucralfate  that got rid of the original pain but it has returned.

## 2023-11-16 NOTE — ED Provider Notes (Signed)
 [facility]    CSN: [id number] Arrival date & time: 11/16/23  1051      History   Chief Complaint Chief Complaint  Patient presents with   Abdominal Pain   Appointment    HPI Eduardo Moore is a 43 y.o. male.   43 year old male who presents urgent care with complaints of intermittent abdominal pain, nausea with associated feeling weak as well as occasional dizziness.  He reports that his symptoms are not constant but are persistent.  He reports the abdominal pain does seem to be worse in the mornings and after eating.  The pain is located in the left lower quadrant.  He has had nausea but no vomiting.  This has made it more difficult for him to eat and drink.  He does have a history of constipation but relates that he has been having bowel movements followed by some diarrhea.  He has tried taking milk of magnesia which did help a little bit.  He has had to see gastroenterology for his constipation and his last visit with them was in February but at that time he was doing well.  He does remember that he had similar symptoms a few years ago and at that time he was diagnosed with constipation as well.  He denies any fevers, loss of consciousness, vomiting, sick exposures, cough, chest pain, reflux symptoms, congestion   Abdominal Pain Associated symptoms: constipation (Intermittent), diarrhea (Intermittent) and nausea   Associated symptoms: no chest pain, no chills, no cough, no dysuria, no fever, no hematuria, no shortness of breath, no sore throat and no vomiting     Past Medical History:  Diagnosis Date   Anxiety    Current mild episode of major depressive disorder without prior episode (HCC) 05/04/2020   Depression    Dyspnea    when walking   Generalized abdominal pain 04/28/2020   GERD (gastroesophageal reflux disease)    Thyroid disease     Patient Active Problem List   Diagnosis Date Noted   Gastroesophageal reflux disease without esophagitis  04/28/2020   Asthma, moderate persistent 01/06/2020   Status post robot-assisted surgical procedure 11/14/2019   Depression 11/11/2019   Anxiety about health 11/03/2019   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    Past Surgical History:  Procedure Laterality Date   INTERCOSTAL NERVE BLOCK Left 11/14/2019   Procedure: INTERCOSTAL NERVE BLOCK;  Surgeon: Kerrin Elspeth BROCKS, MD;  Location[facility] OR;  Service: Thoracic;  Laterality: Left;   LYMPH NODE DISSECTION N/A 11/14/2019   Procedure: LYMPH NODE DISSECTION;  Surgeon: Kerrin Elspeth BROCKS, MD;  Location: [facility] OR;  Service: Thoracic;  Laterality: N/A;   VIDEO BRONCHOSCOPY WITH ENDOBRONCHIAL NAVIGATION N/A 10/02/2019   Procedure: VIDEO BRONCHOSCOPY WITH ENDOBRONCHIAL NAVIGATION;  Surgeon: Kerrin Elspeth BROCKS, MD;  Location: [facility] OR;  Service: Thoracic;  Laterality: N/A;       Home Medications    Prior to Admission medications   Medication Sig Start Date End Date Taking? Authorizing Provider  psyllium (METAMUCIL) 58.6 % packet Take 1 packet by mouth daily.   Yes [provider]  SUCRALFATE  PO Take by mouth.   Yes [provider]  metFORMIN (GLUCOPHAGE-XR) 750 MG 24 hr tablet Take 750 mg by mouth. 04/10/23   [provider]  gabapentin  (NEURONTIN ) 300 MG capsule TAKE 1 CAPSULE (300 MG TOTAL) BY MOUTH DAILY. Patient not taking: No sig reported 02/27/20 05/04/20  Kerrin Elspeth BROCKS, MD    Family History Family History  Problem Relation Age  of Onset   Asthma Mother    Colitis Mother    Colitis Father    Colon cancer Neg Hx    Esophageal cancer Neg Hx    Stomach cancer Neg Hx     Social History Social History   Tobacco Use   Smoking status: Never   Smokeless tobacco: Never  Vaping Use   Vaping status: Never Used  Substance Use Topics   Alcohol use: Not Currently    Comment: occasional   Drug use: Never     Allergies   Lentil and Pea   Review of Systems Review of Systems  Constitutional:   Negative for chills and fever.  HENT:  Negative for ear pain and sore throat.   Eyes:  Negative for pain and visual disturbance.  Respiratory:  Negative for cough and shortness of breath.   Cardiovascular:  Negative for chest pain and palpitations.  Gastrointestinal:  Positive for abdominal pain, constipation (Intermittent), diarrhea (Intermittent) and nausea. Negative for abdominal distention, anal bleeding and vomiting.  Genitourinary:  Negative for difficulty urinating, dysuria, frequency, hematuria and urgency.  Musculoskeletal:  Negative for arthralgias and back pain.  Skin:  Negative for color change and rash.  Neurological:  Negative for seizures and syncope.  All other systems reviewed and are negative.    Physical Exam Triage Vital Signs ED Triage Vitals  Encounter Vitals Group     BP      Girls Systolic BP Percentile      Girls Diastolic BP Percentile      Boys Systolic BP Percentile      Boys Diastolic BP Percentile      Pulse      Resp      Temp      Temp src      SpO2      Weight      Height      Head Circumference      Peak Flow      Pain Score      Pain Loc      Pain Education      Exclude from Growth Chart    No data found.  Updated Vital Signs BP 131/81 (BP Location: Left Arm)   Pulse 81   Temp 98.3 F (36.8 C) (Oral)   Resp 18   Ht 5' 8 (1.727 m)   Wt 181 lb 9.6 oz (82.4 kg)   SpO2 96%   BMI 27.61 kg/m   Visual Acuity Right Eye Distance:   Left Eye Distance:   Bilateral Distance:    Right Eye Near:   Left Eye Near:    Bilateral Near:     Physical Exam Vitals and nursing note reviewed.  Constitutional:      General: He is not in acute distress.    Appearance: He is well-developed.  HENT:     Head: Normocephalic and atraumatic.  Eyes:     Conjunctiva/sclera: Conjunctivae normal.  Cardiovascular:     Rate and Rhythm: Normal rate and regular rhythm.     Heart sounds: No murmur heard. Pulmonary:     Effort: Pulmonary effort is  normal. No respiratory distress.     Breath sounds: Normal breath sounds.  Abdominal:     Palpations: Abdomen is soft.     Tenderness: There is abdominal tenderness in the left lower quadrant. There is no right CVA tenderness, left CVA tenderness, guarding or rebound. Negative signs include Murphy's sign and McBurney's sign.     Hernia:  There is no hernia in the umbilical area or ventral area.     Comments: Left lower quadrant pain is very mild, possible palpable stool  Musculoskeletal:        General: No swelling.     Cervical back: Neck supple.  Skin:    General: Skin is warm and dry.     Capillary Refill: Capillary refill takes less than 2 seconds.  Neurological:     Mental Status: He is alert.  Psychiatric:        Mood and Affect: Mood normal.      UC Treatments / Results  Labs (all labs ordered are listed, but only abnormal results are displayed) Labs Reviewed  CBC  COMPREHENSIVE METABOLIC PANEL WITH GFR  LIPASE, BLOOD  POCT URINALYSIS DIP (MANUAL ENTRY)    EKG   Radiology DG Abd 2 Views Result Date: 11/16/2023 EXAM: 2 VIEW XRAY OF THE ABDOMEN 11/16/2023 12:22:30 PM COMPARISON: 04/26/2020 abdominal radiograph. Normal. CLINICAL HISTORY: Abdominal pain reoccurring in the last 3 weeks with nausea, decreased appetite, weakness in the legs, and dizziness. Denies any emesis, diarrhea, medication or dietary changes, or anyone with similar symptoms. States that he was see by GI and treated with Sucralfate  that got rid of the original pain but it has returned. FINDINGS: BOWEL: Nonobstructive bowel gas pattern. SOFT TISSUES: No opaque urinary calculi. BONES: No acute osseous abnormality. IMPRESSION: 1. Nonobstructive bowel gas pattern. Electronically signed by: Selinda Blue MD 11/16/2023 12:34 PM EDT RP Workstation: [id number]    Procedures Procedures (including critical care time)  Medications Ordered in UC Medications - No data to display  Initial Impression / Assessment and  Plan / UC Course  I have reviewed the triage vital signs and the nursing notes.  Pertinent labs & imaging results that were available during my care of the patient were reviewed by me and considered in my medical decision making (see chart for details).     Abdominal pain, left lower quadrant - Plan: POC urinalysis dipstick, DG Abd 2 Views, POC urinalysis dipstick, DG Abd 2 Views  Nausea   Urinalysis done today did not show infection.  X-ray of the abdomen done today did not show any acute findings however there does appear to be a large amount of stool.  Blood work including complete blood count, complete metabolic panel and lipase have been drawn today.  These results will take approximately 24 hours to get final results.  If there are any significant abnormalities we will contact you and make recommendations.  These test will check your blood counts, kidney function, liver function and pancreas.  Symptoms could be associated with constipation or possibly and irritable bowel syndrome.  Vital signs and physical exam findings as well as workup thus far is reassuring and it is reasonable for you to be discharged home with close monitoring.  If symptoms worsen significantly then would recommend going to the emergency room for further evaluation.  Recommend contacting gastroenterology as soon as possible to schedule a follow-up appointment for recheck of symptoms.  May return to urgent care as needed  Final Clinical Impressions(s) / UC Diagnoses   Final diagnoses:  Abdominal pain, left lower quadrant  Nausea     Discharge Instructions      Urinalysis done today did not show infection.  X-ray of the abdomen done today did not show any acute findings however there does appear to be a large amount of stool.  Blood work including complete blood count, complete metabolic panel and lipase  have been drawn today.  These results will take approximately 24 hours to get final results.  If there are any  significant abnormalities we will contact you and make recommendations.  These test will check your blood counts, kidney function, liver function and pancreas.  Symptoms could be associated with constipation or possibly and irritable bowel syndrome.  Vital signs and physical exam findings as well as workup thus far is reassuring and it is reasonable for you to be discharged home with close monitoring.  If symptoms worsen significantly then would recommend going to the emergency room for further evaluation.  Recommend contacting gastroenterology as soon as possible to schedule a follow-up appointment for recheck of symptoms.  May return to urgent care as needed     ED Prescriptions   None    PDMP not reviewed this encounter.   Teresa Almarie LABOR, [place] 11/16/23 1254

## 2023-11-19 ENCOUNTER — Ambulatory Visit: Payer: Self-pay

## 2023-11-19 NOTE — Telephone Encounter (Signed)
 Labs revealed isolated hyperbilirubinemia with normal liver enzymes.  Does not appear to be related to patient's symptoms at this time.  Further hepatic testing recommended to evaluate for unconjugated versus conjugated bilirubin.  Recommend patient follow-up with PCP.

## 2023-12-05 NOTE — Progress Notes (Unsigned)
 Established Patient Office Visit  Subjective:  Patient ID: Eduardo Moore, male    DOB: Jul 22, 1980  Age: 43 y.o. MRN: [id number]  CC:  No chief complaint on file.   HPI  11/2020 this visit is accomplished with video interpreter Eduardo Moore Eduardo Moore presents for work in visit because of a weeks worth of left-sided chest pain chest pressure wheezing cough preceded by a viral illness.  He has also postnasal drainage but no sinus pressure.  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 05/04/2020   Depression    Dyspnea    when walking   Generalized abdominal pain 04/28/2020   GERD (gastroesophageal reflux disease)    Thyroid disease     Past Surgical History:  Procedure Laterality Date   INTERCOSTAL NERVE BLOCK Left 11/14/2019   Procedure: INTERCOSTAL NERVE BLOCK;  Surgeon: Kerrin Elspeth BROCKS, MD;  Location: [facility];  Service: Thoracic;  Laterality: Left;   LYMPH NODE DISSECTION N/A 11/14/2019   Procedure: LYMPH NODE DISSECTION;  Surgeon: Kerrin Elspeth BROCKS, MD;  Location: [facility] OR;  Service: Thoracic;  Laterality: N/A;   VIDEO BRONCHOSCOPY WITH ENDOBRONCHIAL NAVIGATION N/A 10/02/2019   Procedure: VIDEO BRONCHOSCOPY WITH ENDOBRONCHIAL NAVIGATION;  Surgeon: Kerrin Elspeth BROCKS, MD;  Location: [facility] OR;  Service: Thoracic;  Laterality: N/A;    Family History  Problem Relation Age of Onset   Asthma Mother    Colitis Mother    Colitis Father    Colon cancer Neg Hx    Esophageal cancer Neg Hx    Stomach cancer Neg Hx     Social History   Socioeconomic History   Marital status: Single    Spouse name: Not on file   Number of children: 5   Years of education: Not on file   Highest education level: Some college, no degree   Occupational History   Occupation: maintenance tech  Tobacco Use   Smoking status: Never   Smokeless tobacco: Never  Vaping Use   Vaping status: Never Used  Substance and Sexual Activity   Alcohol use: Not Currently    Comment: occasional   Drug use: Never   Sexual activity: Yes  Other Topics Concern   Not on file  Social History Narrative   Not on file   Social Drivers of Health   Financial Resource Strain: High Risk (12/03/2023)   Overall Financial Resource Strain (CARDIA)    Difficulty of Paying Living Expenses: Hard  Food Insecurity: Food Insecurity Present (12/03/2023)   Hunger Vital Sign    Worried About Running Out of Food in the Last Year: Sometimes true    Ran Out of Food in the Last Year: Sometimes true  Transportation Needs: No Transportation Needs (12/03/2023)   PRAPARE - Administrator, Civil Service (Medical): No    Lack of Transportation (Non-Medical): No  Physical Activity: Sufficiently Active (12/03/2023)   Exercise Vital Sign    Days of Exercise per Week: 5 days    Minutes of Exercise per Session: 50 min  Stress: No Stress Concern Present (12/03/2023)   [employer] of Occupational Health - Occupational Stress Questionnaire    Feeling of Stress: Not at all  Social Connections: Socially Integrated (12/03/2023)  Social Advertising account executive    Frequency of Communication with Friends and Family: Twice a week    Frequency of Social Gatherings with Friends and Family: More than three times a week    Attends Religious Services: More than 4 times per year    Active Member of [employer] or Organizations: Yes    Attends Banker Meetings: More than 4 times per year    Marital Status: Living with partner  Intimate Partner Violence: Unknown (08/31/2022)   Received from [facility]   HITS    Physically Hurt: Not on file    Insult or Talk Down To: Not on file    Threaten Physical Harm: Not on file    Scream or Curse: Not on file     Outpatient Medications Prior to Visit  Medication Sig Dispense Refill   metFORMIN (GLUCOPHAGE-XR) 750 MG 24 hr tablet Take 750 mg by mouth.     psyllium (METAMUCIL) 58.6 % packet Take 1 packet by mouth daily.     SUCRALFATE  PO Take by mouth.     No facility-administered medications prior to visit.    Allergies  Allergen Reactions   Lentil Shortness Of Breath and Rash    lentils   Pea Shortness Of Breath and Rash    Green Peas    ROS Review of Systems  Constitutional:  Negative for fatigue.  HENT:  Negative for nosebleeds, postnasal drip, rhinorrhea, sinus pressure, sinus pain, sneezing and sore throat.   Respiratory:  Positive for shortness of breath and wheezing. Negative for apnea and cough.        Resp sxs are better  Cardiovascular:  Positive for chest pain.       LLL  Gastrointestinal: Negative.   Genitourinary: Negative.   Neurological:  Negative for dizziness, light-headedness and headaches.  Psychiatric/Behavioral: Negative.        Objective:    Physical Exam Vitals reviewed.  Constitutional:      Appearance: Normal appearance. He is well-developed. He is not diaphoretic.  HENT:     Head: Normocephalic and atraumatic.     Nose: No nasal deformity, septal deviation, mucosal edema or rhinorrhea.     Right Sinus: No maxillary sinus tenderness or frontal sinus tenderness.     Left Sinus: No maxillary sinus tenderness or frontal sinus tenderness.     Mouth/Throat:     Pharynx: No oropharyngeal exudate.  Eyes:     General: No scleral icterus.    Conjunctiva/sclera: Conjunctivae normal.     Pupils: Pupils are equal, round, and reactive to light.  Neck:     Thyroid: No thyromegaly.     Vascular: No carotid bruit or JVD.     Trachea: Trachea normal. No tracheal tenderness or tracheal deviation.  Cardiovascular:     Rate and Rhythm: Normal rate and regular rhythm.     Chest Wall: PMI is not displaced.     Pulses: Normal pulses. No decreased pulses.      Heart sounds: Normal heart sounds, S1 normal and S2 normal. Heart sounds not distant. No murmur heard.    No systolic murmur is present.     No diastolic murmur is present.     No friction rub. No gallop. No S3 or S4 sounds.  Pulmonary:     Effort: No tachypnea, accessory muscle usage or respiratory distress.     Breath sounds: No stridor. Examination of the right-lower field reveals wheezing. Examination of the left-lower field reveals wheezing. Decreased breath  sounds and wheezing present. No rhonchi or rales.  Chest:     Chest wall: Tenderness present.     Comments: LLL Abdominal:     General: Bowel sounds are normal. There is no distension.     Palpations: Abdomen is soft. Abdomen is not rigid.     Tenderness: There is no abdominal tenderness. There is no guarding or rebound.  Musculoskeletal:        General: Normal range of motion.     Cervical back: Normal range of motion and neck supple. No edema, erythema or rigidity. No muscular tenderness. Normal range of motion.  Lymphadenopathy:     Head:     Right side of head: No submental or submandibular adenopathy.     Left side of head: No submental or submandibular adenopathy.     Cervical: No cervical adenopathy.  Skin:    General: Skin is warm and dry.     Coloration: Skin is not pale.     Findings: No rash.     Nails: There is no clubbing.  Neurological:     General: No focal deficit present.     Mental Status: He is alert and oriented to person, place, and time.     Sensory: No sensory deficit.  Psychiatric:        Speech: Speech normal.        Behavior: Behavior normal.     There were no vitals taken for this visit. Wt Readings from Last 3 Encounters:  11/16/23 181 lb 9.6 oz (82.4 kg)  07/30/23 186 lb (84.4 kg)  05/02/23 192 lb 3.2 oz (87.2 kg)     Health Maintenance Due  Topic Date Due   Pneumococcal Vaccine (1 of 2 - PCV) Never done   Hepatitis B Vaccines 19-59 Average Risk (1 of 3 - 19+ 3-dose series) Never  done   HPV VACCINES (1 - 3-dose SCDM series) Never done   Influenza Vaccine  10/12/2023   COVID-19 Vaccine (3 - 2025-26 season) 11/12/2023       Topic Date Due   Hepatitis B Vaccines 19-59 Average Risk (1 of 3 - 19+ 3-dose series) Never done   HPV VACCINES (1 - 3-dose SCDM series) Never done    No results found for: TSH Lab Results  Component Value Date   WBC 5.2 11/16/2023   HGB 14.4 11/16/2023   HCT 43.8 11/16/2023   MCV 92.4 11/16/2023   PLT 156 11/16/2023   Lab Results  Component Value Date   NA 138 11/16/2023   K 4.1 11/16/2023   CO2 23 11/16/2023   GLUCOSE 81 11/16/2023   BUN 8 11/16/2023   CREATININE 0.82 11/16/2023   BILITOT 1.7 (H) 11/16/2023   ALKPHOS 54 11/16/2023   AST 23 11/16/2023   ALT 23 11/16/2023   PROT 7.0 11/16/2023   ALBUMIN  4.2 11/16/2023   CALCIUM 9.2 11/16/2023   ANIONGAP 11 11/16/2023   No results found for: CHOL No results found for: HDL No results found for: LDLCALC No results found for: TRIG No results found for: CHOLHDL No results found for: YHAJ8R    Assessment & Plan:   Problem List Items Addressed This Visit   None    No orders of the defined types were placed in this encounter.    Follow-up: No follow-ups on file.    Belvie Silvan, MD

## 2023-12-06 ENCOUNTER — Encounter: Payer: Self-pay | Admitting: Critical Care Medicine

## 2023-12-06 ENCOUNTER — Ambulatory Visit: Payer: PRIVATE HEALTH INSURANCE | Attending: Family Medicine | Admitting: Critical Care Medicine

## 2023-12-06 ENCOUNTER — Other Ambulatory Visit: Payer: Self-pay

## 2023-12-06 VITALS — BP 118/72 | HR 79 | Ht 68.0 in | Wt 185.2 lb

## 2023-12-06 DIAGNOSIS — J4540 Moderate persistent asthma, uncomplicated: Secondary | ICD-10-CM

## 2023-12-06 DIAGNOSIS — R1032 Left lower quadrant pain: Secondary | ICD-10-CM

## 2023-12-06 DIAGNOSIS — K581 Irritable bowel syndrome with constipation: Secondary | ICD-10-CM

## 2023-12-06 DIAGNOSIS — Z8619 Personal history of other infectious and parasitic diseases: Secondary | ICD-10-CM

## 2023-12-06 DIAGNOSIS — R7303 Prediabetes: Secondary | ICD-10-CM

## 2023-12-06 DIAGNOSIS — K219 Gastro-esophageal reflux disease without esophagitis: Secondary | ICD-10-CM

## 2023-12-06 MED ORDER — ALBUTEROL SULFATE HFA 108 (90 BASE) MCG/ACT IN AERS
2.0000 | INHALATION_SPRAY | Freq: Four times a day (QID) | RESPIRATORY_TRACT | 2 refills | Status: AC | PRN
  Filled 2023-12-06: qty 6.7, 25d supply, fill #0

## 2023-12-06 NOTE — Patient Instructions (Signed)
 Referral back to gastroenterology was made you do need a colonoscopy Get your orange  card application completed  Screening labs obtained today  Refill on albuterol  inhaler given  Stay on Metamucil for now  Return to clinic for primary care 6 months

## 2023-12-07 ENCOUNTER — Ambulatory Visit: Payer: Self-pay | Admitting: Critical Care Medicine

## 2023-12-07 DIAGNOSIS — R1032 Left lower quadrant pain: Secondary | ICD-10-CM | POA: Insufficient documentation

## 2023-12-07 DIAGNOSIS — K589 Irritable bowel syndrome without diarrhea: Secondary | ICD-10-CM | POA: Insufficient documentation

## 2023-12-07 LAB — LIPID PANEL
Chol/HDL Ratio: 2.5 ratio (ref 0.0–5.0)
Cholesterol, Total: 172 mg/dL (ref 100–199)
HDL: 70 mg/dL (ref >39)
LDL Chol Calc (NIH): 82 mg/dL (ref 0–99)
Triglycerides: 117 mg/dL (ref 0–149)
VLDL Cholesterol Cal: 20 mg/dL (ref 5–40)

## 2023-12-07 LAB — CBC WITH DIFFERENTIAL/PLATELET
Basophils Absolute: 0 x10E3/uL (ref 0.0–0.2)
Basos: 0 %
EOS (ABSOLUTE): 0.1 x10E3/uL (ref 0.0–0.4)
Eos: 2 %
Hematocrit: 45 % (ref 37.5–51.0)
Hemoglobin: 14.6 g/dL (ref 13.0–17.7)
Immature Grans (Abs): 0 x10E3/uL (ref 0.0–0.1)
Immature Granulocytes: 0 %
Lymphocytes Absolute: 2 x10E3/uL (ref 0.7–3.1)
Lymphs: 31 %
MCH: 30.1 pg (ref 26.6–33.0)
MCHC: 32.4 g/dL (ref 31.5–35.7)
MCV: 93 fL (ref 79–97)
Monocytes Absolute: 0.5 x10E3/uL (ref 0.1–0.9)
Monocytes: 8 %
Neutrophils Absolute: 3.7 x10E3/uL (ref 1.4–7.0)
Neutrophils: 59 %
Platelets: 157 x10E3/uL (ref 150–450)
RBC: 4.85 x10E6/uL (ref 4.14–5.80)
RDW: 12.8 % (ref 11.6–15.4)
WBC: 6.4 x10E3/uL (ref 3.4–10.8)

## 2023-12-07 LAB — CMP14+EGFR
ALT: 27 IU/L (ref 0–44)
AST: 22 IU/L (ref 0–40)
Albumin: 4.3 g/dL (ref 4.1–5.1)
Alkaline Phosphatase: 76 IU/L (ref 47–123)
BUN/Creatinine Ratio: 15 (ref 9–20)
BUN: 14 mg/dL (ref 6–24)
Bilirubin Total: 0.8 mg/dL (ref 0.0–1.2)
CO2: 21 mmol/L (ref 20–29)
Calcium: 9.4 mg/dL (ref 8.7–10.2)
Chloride: 105 mmol/L (ref 96–106)
Creatinine, Ser: 0.93 mg/dL (ref 0.76–1.27)
Globulin, Total: 2.3 g/dL (ref 1.5–4.5)
Glucose: 85 mg/dL (ref 70–99)
Potassium: 4.4 mmol/L (ref 3.5–5.2)
Sodium: 142 mmol/L (ref 134–144)
Total Protein: 6.6 g/dL (ref 6.0–8.5)
eGFR: 104 mL/min/1.73 (ref >59)

## 2023-12-07 LAB — HEMOGLOBIN A1C
Est. average glucose Bld gHb Est-mCnc: 123 mg/dL
Hgb A1c MFr Bld: 5.9 % — ABNORMAL HIGH (ref 4.8–5.6)

## 2023-12-07 NOTE — Assessment & Plan Note (Signed)
 See IBS assessment also may not be emptying stomach properly may need upper and lower endoscopies and colonoscopies

## 2023-12-07 NOTE — Progress Notes (Signed)
 Let patient know no evidence of diabetes blood counts normal kidney function liver function normal cholesterol normal

## 2023-12-07 NOTE — Assessment & Plan Note (Signed)
 Assessment in [place] suggested IBS will refer back to gastroenterology may need Linzess or some other compound

## 2023-12-07 NOTE — Assessment & Plan Note (Addendum)
 Stable at this time continue just with albuterol  as needed alone

## 2023-12-07 NOTE — Assessment & Plan Note (Signed)
 No apparent reflux symptoms at this time as a cause for abdominal symptoms

## 2023-12-07 NOTE — Assessment & Plan Note (Signed)
 No evidence of recurrence

## 2024-03-14 ENCOUNTER — Ambulatory Visit: Payer: PRIVATE HEALTH INSURANCE

## 2024-03-14 DIAGNOSIS — Z23 Encounter for immunization: Secondary | ICD-10-CM | POA: Diagnosis not present

## 2024-03-14 NOTE — Progress Notes (Signed)
Flu vaccine administered per protocols.  Information sheet given. Patient denies and pain or discomfort at injection site. Tolerated injection well no reaction.  

## 2024-04-07 ENCOUNTER — Ambulatory Visit: Payer: PRIVATE HEALTH INSURANCE | Admitting: Gastroenterology

## 2024-04-15 ENCOUNTER — Encounter: Payer: PRIVATE HEALTH INSURANCE | Admitting: Critical Care Medicine

## 2024-12-08 ENCOUNTER — Encounter: Payer: PRIVATE HEALTH INSURANCE | Admitting: Nurse Practitioner
